# Patient Record
Sex: Female | Born: 1950 | Race: White | Hispanic: No | Marital: Married | Smoking: Never smoker
Health system: Southern US, Community
[De-identification: ages and names within clinical notes are randomized; demographics above are authoritative.]

## PROBLEM LIST (undated history)

## (undated) DIAGNOSIS — K5792 Diverticulitis of intestine, part unspecified, without perforation or abscess without bleeding: Secondary | ICD-10-CM

## (undated) DIAGNOSIS — T7840XA Allergy, unspecified, initial encounter: Secondary | ICD-10-CM

## (undated) DIAGNOSIS — B019 Varicella without complication: Secondary | ICD-10-CM

## (undated) DIAGNOSIS — K635 Polyp of colon: Secondary | ICD-10-CM

## (undated) DIAGNOSIS — E271 Primary adrenocortical insufficiency: Secondary | ICD-10-CM

## (undated) DIAGNOSIS — C801 Malignant (primary) neoplasm, unspecified: Secondary | ICD-10-CM

## (undated) DIAGNOSIS — M1990 Unspecified osteoarthritis, unspecified site: Secondary | ICD-10-CM

## (undated) DIAGNOSIS — K219 Gastro-esophageal reflux disease without esophagitis: Secondary | ICD-10-CM

## (undated) HISTORY — PX: APPENDECTOMY: SHX54

## (undated) HISTORY — PX: BREAST SURGERY: SHX581

## (undated) HISTORY — DX: Polyp of colon: K63.5

## (undated) HISTORY — DX: Primary adrenocortical insufficiency: E27.1

## (undated) HISTORY — PX: ABDOMINAL HYSTERECTOMY: SHX81

## (undated) HISTORY — PX: BREAST EXCISIONAL BIOPSY: SUR124

## (undated) HISTORY — DX: Diverticulitis of intestine, part unspecified, without perforation or abscess without bleeding: K57.92

## (undated) HISTORY — PX: FOOT SURGERY: SHX648

## (undated) HISTORY — PX: TUBAL LIGATION: SHX77

## (undated) HISTORY — DX: Varicella without complication: B01.9

## (undated) HISTORY — DX: Gastro-esophageal reflux disease without esophagitis: K21.9

## (undated) HISTORY — PX: OTHER SURGICAL HISTORY: SHX169

## (undated) HISTORY — DX: Allergy, unspecified, initial encounter: T78.40XA

---

## 1998-11-28 ENCOUNTER — Other Ambulatory Visit: Admission: RE | Admit: 1998-11-28 | Discharge: 1998-11-28 | Payer: Self-pay | Admitting: Obstetrics and Gynecology

## 2005-07-09 ENCOUNTER — Ambulatory Visit: Payer: Self-pay | Admitting: Unknown Physician Specialty

## 2006-08-15 ENCOUNTER — Ambulatory Visit: Payer: Self-pay | Admitting: Unknown Physician Specialty

## 2006-10-13 ENCOUNTER — Ambulatory Visit: Payer: Self-pay | Admitting: Internal Medicine

## 2007-11-10 ENCOUNTER — Ambulatory Visit: Payer: Self-pay | Admitting: Internal Medicine

## 2007-11-28 ENCOUNTER — Inpatient Hospital Stay: Payer: Self-pay | Admitting: Internal Medicine

## 2008-07-08 LAB — HM COLONOSCOPY

## 2008-11-13 ENCOUNTER — Ambulatory Visit: Payer: Self-pay | Admitting: Internal Medicine

## 2008-12-11 ENCOUNTER — Ambulatory Visit: Payer: Self-pay | Admitting: Unknown Physician Specialty

## 2008-12-22 HISTORY — PX: BREAST BIOPSY: SHX20

## 2009-11-20 ENCOUNTER — Ambulatory Visit: Payer: Self-pay | Admitting: Internal Medicine

## 2009-11-22 ENCOUNTER — Ambulatory Visit: Payer: Self-pay | Admitting: Internal Medicine

## 2009-11-28 ENCOUNTER — Ambulatory Visit: Payer: Self-pay | Admitting: Surgery

## 2010-05-02 ENCOUNTER — Ambulatory Visit: Payer: Self-pay | Admitting: Internal Medicine

## 2010-10-10 ENCOUNTER — Ambulatory Visit: Payer: Self-pay | Admitting: Unknown Physician Specialty

## 2010-11-21 ENCOUNTER — Ambulatory Visit: Payer: Self-pay | Admitting: Internal Medicine

## 2011-11-25 ENCOUNTER — Ambulatory Visit: Payer: Self-pay | Admitting: Internal Medicine

## 2012-11-25 ENCOUNTER — Ambulatory Visit: Payer: Self-pay | Admitting: Internal Medicine

## 2013-03-08 LAB — HM PAP SMEAR: HM PAP: NORMAL

## 2013-07-06 ENCOUNTER — Encounter: Payer: Self-pay | Admitting: Internal Medicine

## 2013-07-06 ENCOUNTER — Ambulatory Visit (INDEPENDENT_AMBULATORY_CARE_PROVIDER_SITE_OTHER): Payer: BC Managed Care – PPO | Admitting: Internal Medicine

## 2013-07-06 VITALS — BP 102/66 | HR 91 | Temp 97.8°F | Resp 14 | Ht 64.0 in | Wt 129.8 lb

## 2013-07-06 DIAGNOSIS — K5732 Diverticulitis of large intestine without perforation or abscess without bleeding: Secondary | ICD-10-CM

## 2013-07-06 DIAGNOSIS — G4700 Insomnia, unspecified: Secondary | ICD-10-CM

## 2013-07-06 DIAGNOSIS — F5104 Psychophysiologic insomnia: Secondary | ICD-10-CM

## 2013-07-06 DIAGNOSIS — E2749 Other adrenocortical insufficiency: Secondary | ICD-10-CM

## 2013-07-06 DIAGNOSIS — E271 Primary adrenocortical insufficiency: Secondary | ICD-10-CM

## 2013-07-06 DIAGNOSIS — Z800 Family history of malignant neoplasm of digestive organs: Secondary | ICD-10-CM

## 2013-07-06 DIAGNOSIS — K5792 Diverticulitis of intestine, part unspecified, without perforation or abscess without bleeding: Secondary | ICD-10-CM

## 2013-07-06 NOTE — Progress Notes (Signed)
Patient ID: Veronica Ferrell, female   DOB: July 24, 1951, 62 y.o.   MRN: [id number]  Patient Active Problem List   Diagnosis Date Noted  . Family history of colon cancer requiring screening colonoscopy 07/08/2013  . Chronic insomnia 07/08/2013  . Addison's disease   . Diverticulitis     Subjective:  CC:   Chief Complaint  Patient presents with  . Establish Care    HPI:   Veronica Ferrell is a 62 y.o. female who presents as a new patient to establish primary care with the chief complaint of  Adrenal insufficiency.  Veronica Ferrell was diagnosed with addisons at age 27.  It has been historically managed with cortisone and florinef with no adverse consequences. However in January she developed weight loss and lethargy which occurred in the setting of recent initiation of Veronica Ferrell for onychomycosis. This was prescribed by her dermatologist Veronica Ferrell  Of [facility].  Evaluation at that time noted hyponatremia, hyperkalemia.  Veronica Ferrell referred her to endocrinology after he was unable to improve her symptoms I altering her medications. She has been seeing Dr . Veronica Ferrell regularly for titration of medications and is working hard to restore normal levels  Taking florinef daily now.  She has developed recurrent daily  hot flashes which are tolerable on her current dose of her in.  She denies vaginal dryness and other symptoms and does not want to escalate her dose..   her annual pelvic exams were done by her primary care doctor in the last one was several years ago.   she is status post hysterectomy but is not sure if she has a cervix; records are currently unavailable.   She has annual mammograms  in December,  Dense breasts,  Normal biopsy of right sideby Dr.  Michela Ferrell years ago..    She is due for colonoscopy. Wants to see Veronica Ferrell.  Last one 5 yrs ago,  Diverticulosis noted.  Last diverticulitis episode  6 yrs ago .  FH of colon Ca in mother .   Exercises 5 days per week elliptical and walking and  weights 3 days a week . Osteoporosis  Her bone density has improved  On bisphosphonates but were  stopped secondary to a history of esophageal stricture and concern for risk of esophagitis. She has  .  No history of fractures .  Mother had hip fracture at 42   Chronic insmonia.  Uses valium 1/4 tablet as needed,  Trouble initiating.  didn't tolerate Remus Loffler    Past Medical History  Diagnosis Date  . GERD (gastroesophageal reflux disease)   . Colon polyps   . Diverticulitis   . Addison's disease   . Chicken pox     Past Surgical History  Procedure Laterality Date  . Varicose veins    . Breast surgery    . Appendectomy    . Abdominal hysterectomy      Family History  Problem Relation Age of Onset  . Cancer Mother 76    Breast Ca  colon Ca (58)  and brain Ca (69)  . Cancer Father   . Cancer Maternal Aunt     breast  . Cancer Maternal Grandmother     breast  . Cancer Maternal Aunt     breast ca    History   Social History  . Marital Status: Married    Spouse Name: N/A    Number of Children: N/A  . Years of Education: N/A   Occupational History  .  Not on file.   Social History Main Topics  . Smoking status: Never Smoker   . Smokeless tobacco: Never Used  . Alcohol Use: 4.2 oz/week    7 Glasses of wine per week  . Drug Use: No  . Sexually Active: Yes   Other Topics Concern  . Not on file   Social History Narrative  . No narrative on file    Allergies  Allergen Reactions  . Boniva (Ibandronic Acid) Nausea And Vomiting  . Clindamycin/Lincomycin Dermatitis  . Penicillins Rash    Review of Systems:   Patient denies headache, fevers, malaise, unintentional weight loss, skin rash, eye pain, sinus congestion and sinus pain, sore throat, dysphagia,  hemoptysis , cough, dyspnea, wheezing, chest pain, palpitations, orthopnea, edema, abdominal pain, nausea, melena, diarrhea, constipation, flank pain, dysuria, hematuria, urinary  Frequency, nocturia, numbness,  tingling, seizures,  Focal weakness, Loss of consciousness,  Tremor, insomnia, depression, anxiety, and suicidal ideation.    Objective:  BP 102/66  Pulse 91  Temp(Src) 97.8 F (36.6 C) (Oral)  Resp 14  Ht 5\' 4"  (1.626 m)  Wt 129 lb 12 oz (58.854 kg)  BMI 22.26 kg/m2  SpO2 97%  General appearance: alert, cooperative and appears stated age Ears: normal TM's and external ear canals both ears Throat: lips, mucosa, and tongue normal; teeth and gums normal Neck: no adenopathy, no carotid bruit, supple, symmetrical, trachea midline and thyroid not enlarged, symmetric, no tenderness/mass/nodules Back: symmetric, no curvature. ROM normal. No CVA tenderness. Lungs: clear to auscultation bilaterally Heart: regular rate and rhythm, S1, S2 normal, no murmur, click, rub or gallop Abdomen: soft, non-tender; bowel sounds normal; no masses,  no organomegaly Pulses: 2+ and symmetric Skin: Skin color, texture, turgor normal. No rashes or lesions Lymph nodes: Cervical, supraclavicular, and axillary nodes normal.  Assessment and Plan:  Addison's disease Recent episode of adrenal insufficiency in January was likely precipitated by use of Veronica Ferrell. Veronica Ferrell is managing her dosing of Florinef and cortisone.  Diverticulitis By colonoscopy with most recent episode 6 years ago. Continue  Family history of colon cancer requiring screening colonoscopy Referral to Dr. wall per patient request.  Chronic insomnia Managed with Remeron 30 mg and 2.5 mg Valium. Discuss her use of quartering tablets of valium  which is inconsistent. Will refill with a 5 mg tablet so she went consistent dosing.   Updated Medication List Outpatient Encounter Prescriptions as of 07/06/2013  Medication Sig Dispense Refill  . aspirin 81 MG tablet Take 81 mg by mouth daily.      . calcium citrate (CALCITRATE - DOSED IN MG ELEMENTAL CALCIUM) 950 MG tablet Take 1 tablet by mouth daily.      . Cholecalciferol (VITAMIN D3) 1000  UNITS CAPS Take 1 capsule by mouth daily.      . diazepam (VALIUM) 10 MG tablet Take 10 mg by mouth at bedtime as needed for anxiety.      Marland Kitchen estrogens, conjugated, (PREMARIN) 0.3 MG tablet Take 0.3 mg by mouth daily. Take daily for 21 days then do not take for 7 days.      . fludrocortisone (FLORINEF) 0.1 MG tablet Take 0.1 mg by mouth daily.      . hydrocortisone (CORTEF) 10 MG tablet Take 10 mg by mouth 2 (two) times daily. 10 mg in the morning and 20 mg in the evening      . mirtazapine (REMERON) 30 MG tablet Take 30 mg by mouth at bedtime.      . Multiple  Vitamins-Minerals (MULTIVITAMIN WITH MINERALS) tablet Take 1 tablet by mouth daily.      Marland Kitchen omeprazole (PRILOSEC) 20 MG capsule Take 20 mg by mouth daily.      . polyethylene glycol powder (GLYCOLAX/MIRALAX) powder Take 17 g by mouth daily.      . vitamin C (ASCORBIC ACID) 500 MG tablet Take 500 mg by mouth daily.       No facility-administered encounter medications on file as of 07/06/2013.     Orders Placed This Encounter  Procedures  . HM MAMMOGRAPHY  . Ambulatory referral to Gastroenterology  . HM COLONOSCOPY    No Follow-up on file.

## 2013-07-08 ENCOUNTER — Encounter: Payer: Self-pay | Admitting: Internal Medicine

## 2013-07-08 DIAGNOSIS — E271 Primary adrenocortical insufficiency: Secondary | ICD-10-CM | POA: Insufficient documentation

## 2013-07-08 DIAGNOSIS — Z800 Family history of malignant neoplasm of digestive organs: Secondary | ICD-10-CM | POA: Insufficient documentation

## 2013-07-08 DIAGNOSIS — F5104 Psychophysiologic insomnia: Secondary | ICD-10-CM | POA: Insufficient documentation

## 2013-07-08 DIAGNOSIS — K5730 Diverticulosis of large intestine without perforation or abscess without bleeding: Secondary | ICD-10-CM | POA: Insufficient documentation

## 2013-07-08 NOTE — Assessment & Plan Note (Signed)
Referral to Dr. wall per patient request.

## 2013-07-08 NOTE — Assessment & Plan Note (Signed)
Recent episode of adrenal insufficiency in January was likely precipitated by use of Lamisil. Dr. [name] is managing her dosing of Florinef and cortisone.

## 2013-07-08 NOTE — Assessment & Plan Note (Signed)
Managed with Remeron 30 mg and 2.5 mg Valium. Discuss her use of quartering tablets of valium  which is inconsistent. Will refill with a 5 mg tablet so she went consistent dosing.

## 2013-07-08 NOTE — Assessment & Plan Note (Signed)
By colonoscopy with most recent episode 6 years ago. Continue

## 2013-08-08 ENCOUNTER — Telehealth: Payer: Self-pay | Admitting: Internal Medicine

## 2013-08-08 DIAGNOSIS — E559 Vitamin D deficiency, unspecified: Secondary | ICD-10-CM

## 2013-08-08 DIAGNOSIS — R5381 Other malaise: Secondary | ICD-10-CM

## 2013-08-08 DIAGNOSIS — E785 Hyperlipidemia, unspecified: Secondary | ICD-10-CM

## 2013-08-08 NOTE — Telephone Encounter (Signed)
Pt scheduled CPE for March 2015.  Asking if she can come in a few days prior to have labs.  No orders in at this time.  Please advise.

## 2013-08-08 NOTE — Telephone Encounter (Signed)
Certainly,  i have entered them as of march 2nd. She can make appt

## 2013-08-11 NOTE — Telephone Encounter (Signed)
Patient notified and will callback for lab appointment prior to CPE.

## 2013-09-01 ENCOUNTER — Telehealth: Payer: Self-pay | Admitting: Internal Medicine

## 2013-09-01 DIAGNOSIS — Z1239 Encounter for other screening for malignant neoplasm of breast: Secondary | ICD-10-CM

## 2013-09-01 NOTE — Telephone Encounter (Signed)
Not diagnostic,  3D ordered for dense breasts

## 2013-09-01 NOTE — Telephone Encounter (Signed)
Patient is aware of the process for scheduling her mammogram.

## 2013-09-01 NOTE — Telephone Encounter (Signed)
December 18th mammogram in the morning, last was on 12/08/12.  [place].  Pt would like to get this scheduled as soon as possible.  States Dr. Darrick Huntsman told her at last visit it would be diagnostic due to dense breasts.

## 2013-09-16 ENCOUNTER — Encounter: Payer: Self-pay | Admitting: Internal Medicine

## 2013-10-12 ENCOUNTER — Ambulatory Visit (INDEPENDENT_AMBULATORY_CARE_PROVIDER_SITE_OTHER): Payer: BC Managed Care – PPO | Admitting: Podiatry

## 2013-10-12 ENCOUNTER — Encounter: Payer: Self-pay | Admitting: Podiatry

## 2013-10-12 VITALS — BP 103/64 | HR 84 | Resp 16 | Ht 64.0 in | Wt 120.0 lb

## 2013-10-12 DIAGNOSIS — L600 Ingrowing nail: Secondary | ICD-10-CM

## 2013-10-12 MED ORDER — NEOMYCIN-POLYMYXIN-HC 3.5-10000-1 OT SOLN: OTIC | Status: DC

## 2013-10-12 NOTE — Progress Notes (Signed)
N HURT L B/L GREAT TOENAILS RIGHT IS WORSE D YRS? O SLOWLY C WORSE A GROWING OUT T PT TRIMS TOENAILS, HAS PEDICURES

## 2013-10-12 NOTE — Progress Notes (Signed)
Veronica Ferrell presents today as a 62 year old white female with a chief complaint of painful ingrown toenails to the hallux fibular border bilaterally. She relates that she's recently had her nails done while out down and they were cut them correctly possibly causing this problem. She states down cycler seem to hurt the most particularly on the right foot.  Objective: I have reviewed her past medical history medications allergies review of systems. Vital signs are stable she is alert and oriented x3. Vascular evaluation reveals strong palpable pulses bilateral. Cutaneous evaluation demonstrates supple while hydrated cutis with exception of sharp incurvated nail margins along the fibular border of the hallux bilaterally. She does demonstrates distal onychocryptosis to the hallux right.  Assessment: Ingrown nail hallux bilateral fibular border.  Plan: We discussed etiology pathology conservative versus surgical therapies at this point we decided to perform chemical matrixectomy to the fibular border hallux bilateral. This was performed after 3 cc of a 50-50 mixture of Marcaine plain and lidocaine plain was infiltrated in a digital block about the hallux bilaterally. Each great toe was then prepped and draped in is normal sterile fashion. The toe was exsanguinated and rubber band tourniquet was applied. Fibular borders were split from distal to proximal and avulsed the nail plate. 3 applications of phenol were applied 30 seconds each to the nailbed as well as the matrix. It is neutralized with isopropyl alcohol. A dressing consisting of a Telfa pad and Silvadene cream with Caban was placed on the toes. She was given both oral and written home-going instructions for the care of the toes and soaking instructions. She was also given a prescription for Cortisporin Otic to be applied after soaking. All with her in one week.

## 2013-10-12 NOTE — Patient Instructions (Signed)

## 2013-10-19 ENCOUNTER — Encounter: Payer: Self-pay | Admitting: Podiatry

## 2013-10-19 ENCOUNTER — Ambulatory Visit (INDEPENDENT_AMBULATORY_CARE_PROVIDER_SITE_OTHER): Payer: BC Managed Care – PPO | Admitting: Podiatry

## 2013-10-19 VITALS — BP 118/94 | HR 93 | Resp 16 | Ht 64.0 in | Wt 120.0 lb

## 2013-10-19 DIAGNOSIS — L600 Ingrowing nail: Secondary | ICD-10-CM

## 2013-10-19 NOTE — Progress Notes (Signed)
Veronica Ferrell presents today one week status post matrixectomy hallux bilateral fibular border. She states that they're still a little easy. She continues to soak in Betadine and water. She continues to dress the toes with Cortisporin Otic and a Band-Aid.  Objective: Vital signs are stable she is alert and oriented x3. Left hallux demonstrates no erythema edema cellulitis drainage or odor and appears to be healing perfectly while the right hallux does demonstrate some mild erythema along the fibular border with some serosanguineous drainage. It does not clinically appear to be infected.  Assessment: Well-healing matrixectomy's bilateral hallux. Continue to carefully watch the right hallux.  Plan: Discontinue the use of Betadine and water. She will start with Epsom salts and water on a twice a day basis continue first Cortisporin otic solution and cover during the day and leave open at night. Followup with her on as-needed basis.

## 2013-10-27 ENCOUNTER — Other Ambulatory Visit: Payer: Self-pay

## 2013-11-07 ENCOUNTER — Encounter: Payer: Self-pay | Admitting: Podiatry

## 2013-11-15 ENCOUNTER — Encounter: Payer: Self-pay | Admitting: Internal Medicine

## 2013-11-15 NOTE — Telephone Encounter (Signed)
Pt sent myChart message for Diazepam refill. Your 07/06/13 office note mentions refilling with 5 mg tablet. Pt has been taking 1/4 tab of 10 mg

## 2013-11-16 ENCOUNTER — Other Ambulatory Visit: Payer: Self-pay | Admitting: *Deleted

## 2013-11-16 NOTE — Telephone Encounter (Signed)
Your 07/06/13 office note mentions refilling with 5 mg tablet. Pt has been taking 1/4 tab of 10 mg.

## 2013-11-18 ENCOUNTER — Encounter: Payer: Self-pay | Admitting: *Deleted

## 2013-11-18 MED ORDER — DIAZEPAM 10 MG PO TABS: 10.0000 mg | ORAL_TABLET | Freq: Every evening | ORAL | Status: DC | PRN

## 2013-11-23 ENCOUNTER — Other Ambulatory Visit: Payer: Self-pay | Admitting: *Deleted

## 2013-11-23 MED ORDER — ESTROGENS CONJUGATED 0.3 MG PO TABS: ORAL_TABLET | ORAL | Status: DC

## 2013-11-26 ENCOUNTER — Encounter: Payer: Self-pay | Admitting: Internal Medicine

## 2013-11-28 ENCOUNTER — Ambulatory Visit: Payer: Self-pay | Admitting: Internal Medicine

## 2013-11-28 MED ORDER — ESTROGENS CONJUGATED 0.3 MG PO TABS: 0.3000 mg | ORAL_TABLET | Freq: Every day | ORAL | Status: DC

## 2013-12-08 LAB — HM MAMMOGRAPHY: HM Mammogram: NORMAL

## 2013-12-14 ENCOUNTER — Encounter: Payer: Self-pay | Admitting: Internal Medicine

## 2013-12-19 ENCOUNTER — Encounter: Payer: Self-pay | Admitting: Internal Medicine

## 2013-12-27 ENCOUNTER — Other Ambulatory Visit: Payer: Self-pay | Admitting: *Deleted

## 2013-12-27 NOTE — Telephone Encounter (Signed)
Ok refill? Historical med

## 2013-12-29 MED ORDER — MIRTAZAPINE 30 MG PO TABS: 30.0000 mg | ORAL_TABLET | Freq: Every day | ORAL | Status: DC

## 2013-12-29 NOTE — Telephone Encounter (Signed)
Ok to refill,  Authorized in epic and sent  

## 2014-01-08 LAB — HM COLONOSCOPY

## 2014-01-19 ENCOUNTER — Ambulatory Visit: Payer: Self-pay | Admitting: Gastroenterology

## 2014-01-26 ENCOUNTER — Other Ambulatory Visit: Payer: Self-pay | Admitting: *Deleted

## 2014-01-26 MED ORDER — OMEPRAZOLE 20 MG PO CPDR: 20.0000 mg | DELAYED_RELEASE_CAPSULE | Freq: Every day | ORAL | Status: DC

## 2014-02-22 ENCOUNTER — Other Ambulatory Visit: Payer: Self-pay | Admitting: *Deleted

## 2014-02-22 NOTE — Telephone Encounter (Signed)
Upcoming appt 03/08/14, ok refill?

## 2014-02-24 ENCOUNTER — Other Ambulatory Visit: Payer: Self-pay | Admitting: *Deleted

## 2014-02-24 ENCOUNTER — Encounter: Payer: Self-pay | Admitting: Internal Medicine

## 2014-02-24 MED ORDER — FLUDROCORTISONE ACETATE 0.1 MG PO TABS: 0.1000 mg | ORAL_TABLET | Freq: Every day | ORAL | Status: DC

## 2014-02-24 NOTE — Telephone Encounter (Signed)
Ok to refill,  Refill sent  

## 2014-03-02 ENCOUNTER — Other Ambulatory Visit (INDEPENDENT_AMBULATORY_CARE_PROVIDER_SITE_OTHER): Payer: BC Managed Care – PPO

## 2014-03-02 DIAGNOSIS — R5383 Other fatigue: Principal | ICD-10-CM

## 2014-03-02 DIAGNOSIS — R5381 Other malaise: Secondary | ICD-10-CM

## 2014-03-02 DIAGNOSIS — E875 Hyperkalemia: Secondary | ICD-10-CM

## 2014-03-02 DIAGNOSIS — E559 Vitamin D deficiency, unspecified: Secondary | ICD-10-CM

## 2014-03-02 DIAGNOSIS — E785 Hyperlipidemia, unspecified: Secondary | ICD-10-CM

## 2014-03-02 LAB — LIPID PANEL
Cholesterol: 196 mg/dL (ref 0–200)
HDL: 99.6 mg/dL (ref >39.00)
LDL CALC: 82 mg/dL (ref 0–99)
Total CHOL/HDL Ratio: 2
Triglycerides: 74 mg/dL (ref 0.0–149.0)
VLDL: 14.8 mg/dL (ref 0.0–40.0)

## 2014-03-02 LAB — CBC WITH DIFFERENTIAL/PLATELET
Basophils Absolute: 0.1 10*3/uL (ref 0.0–0.1)
Basophils Relative: 0.7 % (ref 0.0–3.0)
EOS ABS: 0.1 10*3/uL (ref 0.0–0.7)
EOS PCT: 2 % (ref 0.0–5.0)
HCT: 40.3 % (ref 36.0–46.0)
Hemoglobin: 13.3 g/dL (ref 12.0–15.0)
LYMPHS PCT: 55 % — AB (ref 12.0–46.0)
Lymphs Abs: 4.1 10*3/uL — ABNORMAL HIGH (ref 0.7–4.0)
MCHC: 33 g/dL (ref 30.0–36.0)
MCV: 98.1 fl (ref 78.0–100.0)
MONO ABS: 0.6 10*3/uL (ref 0.1–1.0)
Monocytes Relative: 8.2 % (ref 3.0–12.0)
NEUTROS PCT: 34.1 % — AB (ref 43.0–77.0)
Neutro Abs: 2.6 10*3/uL (ref 1.4–7.7)
Platelets: 308 10*3/uL (ref 150.0–400.0)
RBC: 4.11 Mil/uL (ref 3.87–5.11)
RDW: 13.7 % (ref 11.5–14.6)
WBC: 7.5 10*3/uL (ref 4.5–10.5)

## 2014-03-02 LAB — COMPREHENSIVE METABOLIC PANEL
ALBUMIN: 4.1 g/dL (ref 3.5–5.2)
ALT: 15 U/L (ref 0–35)
AST: 20 U/L (ref 0–37)
Alkaline Phosphatase: 25 U/L — ABNORMAL LOW (ref 39–117)
BUN: 21 mg/dL (ref 6–23)
CO2: 28 mEq/L (ref 19–32)
Calcium: 9.6 mg/dL (ref 8.4–10.5)
Chloride: 104 mEq/L (ref 96–112)
Creatinine, Ser: 0.9 mg/dL (ref 0.4–1.2)
GFR: 64.33 mL/min (ref >60.00)
GLUCOSE: 91 mg/dL (ref 70–99)
POTASSIUM: 5.2 meq/L — AB (ref 3.5–5.1)
SODIUM: 139 meq/L (ref 135–145)
TOTAL PROTEIN: 6.4 g/dL (ref 6.0–8.3)
Total Bilirubin: 0.8 mg/dL (ref 0.3–1.2)

## 2014-03-02 LAB — TSH: TSH: 3.71 u[IU]/mL (ref 0.35–5.50)

## 2014-03-03 ENCOUNTER — Encounter: Payer: Self-pay | Admitting: Internal Medicine

## 2014-03-03 LAB — VITAMIN D 25 HYDROXY (VIT D DEFICIENCY, FRACTURES): VIT D 25 HYDROXY: 69 ng/mL (ref 30–89)

## 2014-03-03 NOTE — Addendum Note (Signed)
Addended by: Crecencio Mc on: 03/03/2014 09:20 AM   Modules accepted: Orders

## 2014-03-07 ENCOUNTER — Other Ambulatory Visit: Payer: BC Managed Care – PPO

## 2014-03-07 ENCOUNTER — Other Ambulatory Visit (INDEPENDENT_AMBULATORY_CARE_PROVIDER_SITE_OTHER): Payer: BC Managed Care – PPO

## 2014-03-07 DIAGNOSIS — E875 Hyperkalemia: Secondary | ICD-10-CM

## 2014-03-07 LAB — BASIC METABOLIC PANEL
BUN: 15 mg/dL (ref 6–23)
CALCIUM: 9.3 mg/dL (ref 8.4–10.5)
CO2: 26 mEq/L (ref 19–32)
Chloride: 99 mEq/L (ref 96–112)
Creatinine, Ser: 0.9 mg/dL (ref 0.4–1.2)
GFR: 66.81 mL/min (ref >60.00)
Glucose, Bld: 96 mg/dL (ref 70–99)
Potassium: 4.7 mEq/L (ref 3.5–5.1)
SODIUM: 135 meq/L (ref 135–145)

## 2014-03-08 ENCOUNTER — Encounter: Payer: Self-pay | Admitting: Internal Medicine

## 2014-03-08 ENCOUNTER — Ambulatory Visit (INDEPENDENT_AMBULATORY_CARE_PROVIDER_SITE_OTHER): Payer: BC Managed Care – PPO | Admitting: Internal Medicine

## 2014-03-08 VITALS — BP 102/60 | HR 83 | Temp 97.5°F | Resp 16 | Ht 64.5 in | Wt 126.0 lb

## 2014-03-08 DIAGNOSIS — G454 Transient global amnesia: Secondary | ICD-10-CM

## 2014-03-08 DIAGNOSIS — G459 Transient cerebral ischemic attack, unspecified: Secondary | ICD-10-CM

## 2014-03-08 DIAGNOSIS — Z0000 Encounter for general adult medical examination without abnormal findings: Secondary | ICD-10-CM

## 2014-03-08 MED ORDER — FLUDROCORTISONE ACETATE 0.1 MG PO TABS: 0.1000 mg | ORAL_TABLET | Freq: Every day | ORAL | Status: DC

## 2014-03-08 MED ORDER — HYDROCOD POLST-CHLORPHEN POLST 10-8 MG/5ML PO LQCR: 10.0000 mL | Freq: Every evening | ORAL | Status: DC | PRN

## 2014-03-08 MED ORDER — OMEPRAZOLE 20 MG PO CPDR: 20.0000 mg | DELAYED_RELEASE_CAPSULE | Freq: Every day | ORAL | Status: DC

## 2014-03-08 MED ORDER — MIRTAZAPINE 30 MG PO TABS: 30.0000 mg | ORAL_TABLET | Freq: Every day | ORAL | Status: DC

## 2014-03-08 MED ORDER — HYDROCORTISONE 10 MG PO TABS: 10.0000 mg | ORAL_TABLET | Freq: Two times a day (BID) | ORAL | Status: DC

## 2014-03-08 MED ORDER — ESTROGENS CONJUGATED 0.3 MG PO TABS: 0.3000 mg | ORAL_TABLET | Freq: Every day | ORAL | Status: DC

## 2014-03-08 NOTE — Patient Instructions (Signed)
You had your annual  wellness exam today, along  with your hospital follow up   We will schedule your mammogram as a 3D in December   We will contact you with the cardiology appt.  Follow up in 6 months ,  Sooner depending on the cardiology outcome

## 2014-03-08 NOTE — Assessment & Plan Note (Addendum)
I suspect this may have been a TIA.  MRI was normal.  I am recommending  Cardiology evaluation specifically for an ECHO with bubble study to rule out PFO. Advised her to refrain from sexual intercourse and any physical exertion until she has been evaluated by cardiology.  Referral to [facility] (or whatever they call themselves now) to [place] or [place].

## 2014-03-08 NOTE — Progress Notes (Signed)
Pre-visit discussion using our clinic review tool. No additional management support is needed unless otherwise documented below in the visit note.  

## 2014-03-08 NOTE — Progress Notes (Signed)
Patient ID: Veronica Ferrell, female   DOB: 1951/08/07, 63 y.o.   MRN: [id number]   Subjective:     Veronica Ferrell is a 63 y.o. female and is here for a comprehensive annual physical exam. The patient reports recent episode of transient amnesia.   She did not schedule a hospital follow up to discuss recent overnight admission to out of state hospital for TIA symptoms but decided to wait until her physical.   History: patient as history of Addison's disease;  had sudden onset of amnesia for recent events, which occurred after having intercourse with husband while staying in [place].  Episode Occurred on Feb 23 after flying to [facility] on Feb 22.  Remembers having considerable jet lag symptoms aggravated by chronic adrenal insufficiency.  Had not missed any of he usual medications.   Suddenly could not remember where she was, why she was in [place].  She Could remember the names of her medications, as well as her own name and that of husband, but could not  Recall any events that occurred with in the prior 2-3 hour window of time . Admitted overnight obs to a hospital in [facility] and diagnosed with transient global amnesia by local neurology after MRI of brain, cervical spine, EEG EKG and CXR were done.  .  Told her brain was younger looking that expected but that the amnesia may have been caused by a vascular event.  Not sure if ECHO was done .  Results not currently available for review.    Adrenal insufficiency.  [name] was diagnosed with addisons at age 21.  It has been historically managed with cortisone and florinef with no adverse consequences. However in January 2014 she developed weight loss and lethargy which occurred in the setting of recent initiation of Lamisil for onychomycosis. This was prescribed by her dermatologist [facility].  Evaluation at that time noted hyponatremia, hyperkalemia.  Dr. Arline Asp referred her to endocrinology after he was unable to improve her symptoms I  altering her medications. She has been seeing Dr . Gabriel Carina regularly for titration of medications and is working hard to restore normal levels  Taking florinef daily now.  She has developed recurrent daily  hot flashes which are tolerable on her current dose of her in.  She denies vaginal dryness and other symptoms and does not want to escalate her dose..   her annual pelvic exams have been  done by her primary care doctor in the last one was in 2012, she thinks.   she is status post hysterectomy but is not sure if she has a cervix; records are currently unavailable.   She has annual mammograms  in December,  Dense breasts,  Normal biopsy of right side by Dr.  Pat Patrick years ago..    She had her  5 yr follow up colonoscopy by Lucilla Lame.   Diverticulosis noted.  Last diverticulitis episode  6 yrs ago .  There is a FH of colon Ca in mother .   Exercises 5 days per week elliptical and walking and weights 3 days a week . Osteoporosis  Her bone density has improved  On bisphosphonates but were  stopped secondary to a history of esophageal stricture and concern for risk of esophagitis. She has  .  No history of fractures .  Mother had hip fracture at 46   Chronic insmonia.  Uses valium 1/4 tablet as needed,  Trouble initiating.  didn't tolerate Lorrin Mais  History   Social History  . Marital Status: Married    Spouse Name: N/A    Number of Children: N/A  . Years of Education: N/A   Occupational History  . Not on file.   Social History Main Topics  . Smoking status: Never Smoker   . Smokeless tobacco: Never Used  . Alcohol Use: 4.2 oz/week    7 Glasses of wine per week     Comment: [place]  . Drug Use: No  . Sexual Activity: Yes   Other Topics Concern  . Not on file   Social History Narrative  . No narrative on file   Health Maintenance  Topic Date Due  . Influenza Vaccine  07/22/2014  . Mammogram  12/09/2015  . Pap Smear  03/08/2016  . Tetanus/tdap  03/08/2021  . Colonoscopy   01/09/2024  . Zostavax  Completed    The following portions of the patient's history were reviewed and updated as appropriate: allergies, current medications, past family history, past medical history, past social history, past surgical history and problem list.  Review of Systems A comprehensive review of systems was negative.   Objective:  BP 102/60  Pulse 83  Temp(Src) 97.5 F (36.4 C) (Oral)  Resp 16  Ht 5' 4.5" (1.638 m)  Wt 126 lb (57.153 kg)  BMI 21.30 kg/m2  SpO2 97%  General Appearance:    Alert, cooperative, no distress, appears stated age  Head:    Normocephalic, without obvious abnormality, atraumatic  Eyes:    PERRL, conjunctiva/corneas clear, EOM's intact, fundi    benign, both eyes  Ears:    Normal TM's and external ear canals, both ears  Nose:   Nares normal, septum midline, mucosa normal, no drainage    or sinus tenderness  Throat:   Lips, mucosa, and tongue normal; teeth and gums normal  Neck:   Supple, symmetrical, trachea midline, no adenopathy;    thyroid:  no enlargement/tenderness/nodules; no carotid   bruit or JVD  Back:     Symmetric, no curvature, ROM normal, no CVA tenderness  Lungs:     Clear to auscultation bilaterally, respirations unlabored  Chest Wall:    No tenderness or deformity   Heart:    Regular rate and rhythm, S1 and S2 normal, no murmur, rub   or gallop  Breast Exam:    No tenderness, masses, or nipple abnormality  Abdomen:     Soft, non-tender, bowel sounds active all four quadrants,    no masses, no organomegaly  Genitalia:    Deferred  Rectal:    Deferred   Extremities:   Extremities normal, atraumatic, no cyanosis or edema  Pulses:   2+ and symmetric all extremities  Skin:   Skin color, texture, turgor normal, no rashes or lesions  Lymph nodes:   Cervical, supraclavicular, and axillary nodes normal  Neurologic:   CNII-XII intact, normal strength, sensation and reflexes    throughout    Assessment and Plan:   Transient  global amnesia I suspect this may have been a TIA.  MRI was normal.  I am recommending  Cardiology evaluation specifically for an ECHO with bubble study to rule out PFO. Advised her to refrain from sexual intercourse and any physical exertion until she has been evaluated by cardiology.  Referral to [facility] (or whatever they call themselves now) to [place] or [place].    Encounter for preventive health examination Annual comprehensive exam was done including breast, excluding pelvic and PAP smear.  All screenings have been addressed .    Updated Medication List Outpatient Encounter Prescriptions as of 03/08/2014  Medication Sig  . aspirin 81 MG tablet Take 81 mg by mouth daily.  . calcium citrate (CALCITRATE - DOSED IN MG ELEMENTAL CALCIUM) 950 MG tablet Take 1 tablet by mouth daily.  . Cholecalciferol (VITAMIN D3) 1000 UNITS CAPS Take 1 capsule by mouth daily.  . diazepam (VALIUM) 10 MG tablet Take 1 tablet (10 mg total) by mouth at bedtime as needed for anxiety.  Marland Kitchen estrogens, conjugated, (PREMARIN) 0.3 MG tablet Take 1 tablet (0.3 mg total) by mouth daily.  . fludrocortisone (FLORINEF) 0.1 MG tablet Take 1 tablet (0.1 mg total) by mouth daily.  . hydrocortisone (CORTEF) 10 MG tablet Take 1 tablet (10 mg total) by mouth 2 (two) times daily. 20 mg in the morning and 10 mg in the evening  . mirtazapine (REMERON) 30 MG tablet Take 1 tablet (30 mg total) by mouth at bedtime.  . Multiple Vitamins-Minerals (MULTIVITAMIN WITH MINERALS) tablet Take 1 tablet by mouth daily.  Marland Kitchen omeprazole (PRILOSEC) 20 MG capsule Take 1 capsule (20 mg total) by mouth daily.  . polyethylene glycol powder (GLYCOLAX/MIRALAX) powder Take 17 g by mouth daily.  . vitamin C (ASCORBIC ACID) 500 MG tablet Take 500 mg by mouth daily.  . [DISCONTINUED] estrogens, conjugated, (PREMARIN) 0.3 MG tablet Take 1 tablet (0.3 mg total) by mouth daily.  . [DISCONTINUED] fludrocortisone (FLORINEF) 0.1 MG tablet Take 1 tablet (0.1 mg  total) by mouth daily.  . [DISCONTINUED] hydrocortisone (CORTEF) 10 MG tablet Take 10 mg by mouth 2 (two) times daily. 20 mg in the morning and 10 mg in the evening  . [DISCONTINUED] mirtazapine (REMERON) 30 MG tablet Take 1 tablet (30 mg total) by mouth at bedtime.  . [DISCONTINUED] omeprazole (PRILOSEC) 20 MG capsule Take 1 capsule (20 mg total) by mouth daily.  . chlorpheniramine-HYDROcodone (TUSSIONEX) 10-8 MG/5ML LQCR Take 10 mLs by mouth at bedtime as needed for cough.  . [DISCONTINUED] neomycin-polymyxin-hydrocortisone (CORTISPORIN) otic solution Apply one to two drops to toe after soaking twice daily.

## 2014-03-09 DIAGNOSIS — Z0000 Encounter for general adult medical examination without abnormal findings: Secondary | ICD-10-CM | POA: Insufficient documentation

## 2014-03-09 NOTE — Assessment & Plan Note (Signed)
Annual comprehensive exam was done including breast, excluding pelvic and PAP smear. All screenings have been addressed .  

## 2014-03-16 ENCOUNTER — Encounter: Payer: Self-pay | Admitting: Cardiovascular Disease

## 2014-03-16 ENCOUNTER — Ambulatory Visit (INDEPENDENT_AMBULATORY_CARE_PROVIDER_SITE_OTHER): Payer: BC Managed Care – PPO | Admitting: Cardiovascular Disease

## 2014-03-16 VITALS — BP 90/60 | HR 90 | Ht 64.0 in | Wt 125.5 lb

## 2014-03-16 DIAGNOSIS — E2749 Other adrenocortical insufficiency: Secondary | ICD-10-CM

## 2014-03-16 DIAGNOSIS — G454 Transient global amnesia: Secondary | ICD-10-CM

## 2014-03-16 DIAGNOSIS — E271 Primary adrenocortical insufficiency: Secondary | ICD-10-CM

## 2014-03-16 NOTE — Patient Instructions (Addendum)
We have ordered a echocardiogram with saline contrast bubble study to rule out PFO:  Tuesday, March 31 @ noon We will call you with the results  Please monitor your heart rhythm.  No medication changes were made.Consider aspirin 81 mg x 2  Please call us if you have new issues that need to be addressed before your next appt.

## 2014-03-16 NOTE — Assessment & Plan Note (Addendum)
Records were reviewed from her recent hospital course, including H&P, consultation report, MRI, CT scan, EEG . All studies appeared negative. Given her presentation, there is concern for TIA. Echocardiogram with saline contrast bubble study has been ordered to exclude PFO or ASD. We have talked about adding Plavix to her medical regimen. She prefers to stay on aspirin alone at this time until we have the results of her echocardiogram. We also discussed a Holter monitor. This might help exclude atrial fibrillation or other arrhythmia as a cause of her symptoms and TIA. She would prefer to wait at this time.  We spent significant time discussing the structure of the heart, etiology of possible TIA including PFO and ASD, even arrhythmia.

## 2014-03-16 NOTE — Assessment & Plan Note (Signed)
Managed by Dr. Derrel Nip and Dr. Gabriel Carina.

## 2014-03-16 NOTE — Progress Notes (Signed)
Patient ID: Veronica Ferrell, female    DOB: [birth year], 63 y.o.   MRN: [id number]  HPI Comments:  63 y.o. female with history of Addison's disease, maintained on cortisone and Florinef (previous hyponatremia and hyperkalemia), managed by Dr. Derrel Nip and [place], chronic insomnia, who presents by referral after episode of transient amnesia.  She reports that on 02/12/2014  she flew into [place]. She had 2 glasses of wine, that evening took one quarter pill Valium. In the morning she woke with headache at 6 AM, had coffee. That morning also had sexual relations with her husband. Shortly after had a shower. Coming out of the shower she had acute onset of amnesia. She denied any balance issues, vision issues, no neurologic problems. Her amnesia persisted for many hours. She repeated the same questions over and over. She had taken her regular morning pills but asked her husband 28 times if she had taken her morning pills.   She presented to the emergency room where she had CT scan of the head, MRI, EEG. Testing was essentially normal. No carotid stenoses noted. She was placed on telemetry per the patient with notes indicating no arrhythmia.  She was released. Symptoms seemed to resolve 4-5 hours after initial presentation She does report having a problem with jet lag in the past but typically has upset stomach. Has never had amnesia before Total cholesterol 194, HDL 100, LDL 81  EKG shows normal sinus rhythm with rate 90 beats per minute, no significant ST or T wave changes No echocardiogram done on her hospital admission. No cold or ordered      Outpatient Encounter Prescriptions as of 03/16/2014  Medication Sig  . aspirin 81 MG tablet Take 81 mg by mouth daily.  . calcium citrate (CALCITRATE - DOSED IN MG ELEMENTAL CALCIUM) 950 MG tablet Take 1 tablet by mouth daily.  . chlorpheniramine-HYDROcodone (TUSSIONEX) 10-8 MG/5ML LQCR Take 10 mLs by mouth at bedtime as needed for cough.  .  Cholecalciferol (VITAMIN D3) 1000 UNITS CAPS Take 1 capsule by mouth daily.  . diazepam (VALIUM) 10 MG tablet Take 1 tablet (10 mg total) by mouth at bedtime as needed for anxiety.  Marland Kitchen estrogens, conjugated, (PREMARIN) 0.3 MG tablet Take 1 tablet (0.3 mg total) by mouth daily.  . fludrocortisone (FLORINEF) 0.1 MG tablet Take 1 tablet (0.1 mg total) by mouth daily.  . hydrocortisone (CORTEF) 10 MG tablet Take 1 tablet (10 mg total) by mouth 2 (two) times daily. 20 mg in the morning and 10 mg in the evening  . mirtazapine (REMERON) 30 MG tablet Take 1 tablet (30 mg total) by mouth at bedtime.  . Multiple Vitamins-Minerals (MULTIVITAMIN WITH MINERALS) tablet Take 1 tablet by mouth daily.  Marland Kitchen omeprazole (PRILOSEC) 20 MG capsule Take 1 capsule (20 mg total) by mouth daily.  . polyethylene glycol powder (GLYCOLAX/MIRALAX) powder Take 17 g by mouth daily.  . vitamin C (ASCORBIC ACID) 500 MG tablet Take 500 mg by mouth daily.     Review of Systems  Constitutional: Negative.   HENT: Negative.   Eyes: Negative.   Respiratory: Negative.   Cardiovascular: Negative.   Gastrointestinal: Negative.   Endocrine: Negative.   Musculoskeletal: Negative.   Skin: Negative.   Allergic/Immunologic: Negative.   Neurological: Negative.        Transient amnesia  Hematological: Negative.   Psychiatric/Behavioral: Negative.   All other systems reviewed and are negative.    BP 90/60  Pulse 90  Ht 5\' 4"  (1.626 m)  Wt 125 lb 8 oz (56.926 kg)  BMI 21.53 kg/m2  Physical Exam  Nursing note and vitals reviewed. Constitutional: She is oriented to person, place, and time. She appears well-developed and well-nourished.  HENT:  Head: Normocephalic.  Nose: Nose normal.  Mouth/Throat: Oropharynx is clear and moist.  Eyes: Conjunctivae are normal. Pupils are equal, round, and reactive to light.  Neck: Normal range of motion. Neck supple. No JVD present.  Cardiovascular: Normal rate, regular rhythm, S1 normal, S2  normal, normal heart sounds and intact distal pulses.  Exam reveals no gallop and no friction rub.   No murmur heard. Pulmonary/Chest: Effort normal and breath sounds normal. No respiratory distress. She has no wheezes. She has no rales. She exhibits no tenderness.  Abdominal: Soft. Bowel sounds are normal. She exhibits no distension. There is no tenderness.  Musculoskeletal: Normal range of motion. She exhibits no edema and no tenderness.  Lymphadenopathy:    She has no cervical adenopathy.  Neurological: She is alert and oriented to person, place, and time. Coordination normal.  Skin: Skin is warm and dry. No rash noted. No erythema.  Psychiatric: She has a normal mood and affect. Her behavior is normal. Judgment and thought content normal.    Assessment and Plan

## 2014-03-21 ENCOUNTER — Encounter: Payer: Self-pay | Admitting: Internal Medicine

## 2014-03-21 ENCOUNTER — Other Ambulatory Visit (INDEPENDENT_AMBULATORY_CARE_PROVIDER_SITE_OTHER): Payer: BC Managed Care – PPO

## 2014-03-21 ENCOUNTER — Other Ambulatory Visit: Payer: Self-pay

## 2014-03-21 DIAGNOSIS — G459 Transient cerebral ischemic attack, unspecified: Secondary | ICD-10-CM

## 2014-03-21 DIAGNOSIS — I059 Rheumatic mitral valve disease, unspecified: Secondary | ICD-10-CM

## 2014-03-21 DIAGNOSIS — G454 Transient global amnesia: Secondary | ICD-10-CM

## 2014-05-10 ENCOUNTER — Encounter: Payer: Self-pay | Admitting: Internal Medicine

## 2014-05-16 ENCOUNTER — Telehealth: Payer: Self-pay | Admitting: Internal Medicine

## 2014-05-16 NOTE — Telephone Encounter (Signed)
Patient dropped off paper work that needs to be reviewed so she can continue to get rx for Premain tab 0.3mg . Patient stated that she dropped off the same paper work two weeks ago and sent messages on mychart. Patient is upset that this has not done. Form in Dr Lupita Dawn box. Please call patient/msn

## 2014-05-16 NOTE — Telephone Encounter (Signed)
Paperwork given to Dr. Derrel Nip for review

## 2014-05-18 NOTE — Telephone Encounter (Signed)
See other encounter, Juliann Pulse has already spoken with pt, PA has been approved, pt aware.

## 2014-05-18 NOTE — Telephone Encounter (Signed)
[place] and received  Approval for patient. Patient notified after she had called patient stated that insurance had not received PA called insurance and they had received the PA and approval was granted.

## 2014-06-01 ENCOUNTER — Encounter: Payer: Self-pay | Admitting: Internal Medicine

## 2014-06-01 ENCOUNTER — Ambulatory Visit (INDEPENDENT_AMBULATORY_CARE_PROVIDER_SITE_OTHER): Payer: Medicare Other | Admitting: Internal Medicine

## 2014-06-01 VITALS — BP 100/64 | HR 87 | Temp 98.1°F | Wt 124.0 lb

## 2014-06-01 DIAGNOSIS — K5732 Diverticulitis of large intestine without perforation or abscess without bleeding: Secondary | ICD-10-CM

## 2014-06-01 MED ORDER — CIPROFLOXACIN HCL 500 MG PO TABS: 500.0000 mg | ORAL_TABLET | Freq: Two times a day (BID) | ORAL | Status: DC

## 2014-06-01 MED ORDER — METRONIDAZOLE 500 MG PO TABS: 500.0000 mg | ORAL_TABLET | Freq: Three times a day (TID) | ORAL | Status: DC

## 2014-06-01 NOTE — Patient Instructions (Addendum)
Diverticulitis °A diverticulum is a small pouch or sac on the colon. Diverticulosis is the presence of these diverticula on the colon. Diverticulitis is the irritation (inflammation) or infection of diverticula. °CAUSES  °The colon and its diverticula contain bacteria. If food particles block the tiny opening to a diverticulum, the bacteria inside can grow and cause an increase in pressure. This leads to infection and inflammation and is called diverticulitis. °SYMPTOMS  °· Abdominal pain and tenderness. Usually, the pain is located on the left side of your abdomen. However, it could be located elsewhere. °· Fever. °· Bloating. °· Feeling sick to your stomach (nausea). °· Throwing up (vomiting). °· Abnormal stools. °DIAGNOSIS  °Your caregiver will take a history and perform a physical exam. Since many things can cause abdominal pain, other tests may be necessary. Tests may include: °· Blood tests. °· Urine tests. °· X-ray of the abdomen. °· CT scan of the abdomen. °Sometimes, surgery is needed to determine if diverticulitis or other conditions are causing your symptoms. °TREATMENT  °Most of the time, you can be treated without surgery. Treatment includes: °· Resting the bowels by only having liquids for a few days. As you improve, you will need to eat a low-fiber diet. °· Intravenous (IV) fluids if you are losing body fluids (dehydrated). °· Antibiotic medicines that treat infections may be given. °· Pain and nausea medicine, if needed. °· Surgery if the inflamed diverticulum has burst. °HOME CARE INSTRUCTIONS  °· Try a clear liquid diet (broth, tea, or water for as long as directed by your caregiver). You may then gradually begin a low-fiber diet as tolerated.  °A low-fiber diet is a diet with less than 10 grams of fiber. Choose the foods below to reduce fiber in the diet: °· White breads, cereals, rice, and pasta. °· Cooked fruits and vegetables or soft fresh fruits and vegetables without the skin. °· Ground or  well-cooked tender beef, ham, veal, lamb, pork, or poultry. °· Eggs and seafood. °· After your diverticulitis symptoms have improved, your caregiver may put you on a high-fiber diet. A high-fiber diet includes 14 grams of fiber for every 1000 calories consumed. For a standard 2000 calorie diet, you would need 28 grams of fiber. Follow these diet guidelines to help you increase the fiber in your diet. It is important to slowly increase the amount fiber in your diet to avoid gas, constipation, and bloating. °· Choose whole-grain breads, cereals, pasta, and brown rice. °· Choose fresh fruits and vegetables with the skin on. Do not overcook vegetables because the more vegetables are cooked, the more fiber is lost. °· Choose more nuts, seeds, legumes, dried peas, beans, and lentils. °· Look for food products that have greater than 3 grams of fiber per serving on the Nutrition Facts label. °· Take all medicine as directed by your caregiver. °· If your caregiver has given you a follow-up appointment, it is very important that you go. Not going could result in lasting (chronic) or permanent injury, pain, and disability. If there is any problem keeping the appointment, call to reschedule. °SEEK MEDICAL CARE IF:  °· Your pain does not improve. °· You have a hard time advancing your diet beyond clear liquids. °· Your bowel movements do not return to normal. °SEEK IMMEDIATE MEDICAL CARE IF:  °· Your pain becomes worse. °· You have an oral temperature above 102° F (38.9° C), not controlled by medicine. °· You have repeated vomiting. °· You have bloody or black, tarry stools. °·   Symptoms that brought you to your caregiver become worse or are not getting better. °MAKE SURE YOU:  °· Understand these instructions. °· Will watch your condition. °· Will get help right away if you are not doing well or get worse. °Document Released: 09/17/2005 Document Revised: 03/01/2012 Document Reviewed: 01/13/2011 °[employer]® Patient Information  ©2014 [employer]. ° °

## 2014-06-01 NOTE — Progress Notes (Signed)
Pre visit review using our clinic review tool, if applicable. No additional management support is needed unless otherwise documented below in the visit note. 

## 2014-06-01 NOTE — Progress Notes (Signed)
Subjective:    Patient ID: Veronica Ferrell, female    DOB: 1951-02-25, 63 y.o.   MRN: [id number]  HPI  Pt presents to the clinic today with c/o lower abdominal pain. She reports this started 4 days ago. She describes the pain as crampy and achy. She has felt a little constipated. She did use Miralax and an enema this morning. She reports that she feels bloated. Her stools are normal in color but she does feel like they have "mucuos: in them as well. She denies blood in her stool. She has had some associated fatigue and nausea, but denies vomiting, fever or chills. She does have a history of diverticulosis. Last colonoscopy was 01/18/14.  Review of Systems      Past Medical History  Diagnosis Date  . GERD (gastroesophageal reflux disease)   . Colon polyps   . Diverticulitis   . Addison's disease   . Chicken pox     Current Outpatient Prescriptions  Medication Sig Dispense Refill  . aspirin 81 MG tablet Take 81 mg by mouth daily.      . calcium citrate (CALCITRATE - DOSED IN MG ELEMENTAL CALCIUM) 950 MG tablet Take 1 tablet by mouth daily.      . chlorpheniramine-HYDROcodone (TUSSIONEX) 10-8 MG/5ML LQCR Take 10 mLs by mouth at bedtime as needed for cough.  240 mL  0  . Cholecalciferol (VITAMIN D3) 1000 UNITS CAPS Take 1 capsule by mouth daily.      . diazepam (VALIUM) 10 MG tablet Take 1 tablet (10 mg total) by mouth at bedtime as needed for anxiety.  30 tablet  3  . estrogens, conjugated, (PREMARIN) 0.3 MG tablet Take 1 tablet (0.3 mg total) by mouth daily.  90 tablet  3  . fludrocortisone (FLORINEF) 0.1 MG tablet Take 1 tablet (0.1 mg total) by mouth daily.  90 tablet  3  . hydrocortisone (CORTEF) 10 MG tablet Take 1 tablet (10 mg total) by mouth 2 (two) times daily. 20 mg in the morning and 10 mg in the evening  270 tablet  3  . mirtazapine (REMERON) 30 MG tablet Take 1 tablet (30 mg total) by mouth at bedtime.  90 tablet  3  . Multiple Vitamins-Minerals (MULTIVITAMIN WITH MINERALS)  tablet Take 1 tablet by mouth daily.      Marland Kitchen omeprazole (PRILOSEC) 20 MG capsule Take 1 capsule (20 mg total) by mouth daily.  90 capsule  3  . polyethylene glycol powder (GLYCOLAX/MIRALAX) powder Take 17 g by mouth daily.      . vitamin C (ASCORBIC ACID) 500 MG tablet Take 500 mg by mouth daily.       No current facility-administered medications for this visit.    Allergies  Allergen Reactions  . Boniva [Ibandronic Acid] Nausea And Vomiting  . Clindamycin/Lincomycin Dermatitis  . Penicillins Rash    Family History  Problem Relation Age of Onset  . Cancer Mother 48    Breast Ca  colon Ca (18)  and brain Ca (54)  . Cancer Father   . Cancer Maternal Aunt     breast  . Cancer Maternal Grandmother     breast  . Cancer Maternal Aunt     breast ca    History   Social History  . Marital Status: Married    Spouse Name: N/A    Number of Children: N/A  . Years of Education: N/A   Occupational History  . Not on file.   Social History Main  Topics  . Smoking status: Never Smoker   . Smokeless tobacco: Never Used  . Alcohol Use: 4.2 oz/week    7 Glasses of wine per week     Comment: [place]  . Drug Use: No  . Sexual Activity: Yes   Other Topics Concern  . Not on file   Social History Narrative  . No narrative on file     Constitutional: Denies fever, malaise, fatigue, headache or abrupt weight changes.  Gastrointestinal: Pt reports abdominal pain. Denies constipation, diarrhea or blood in the stool.  GU: Denies urgency, frequency, pain with urination, burning sensation, blood in urine, odor or discharge.    No other specific complaints in a complete review of systems (except as listed in HPI above).  Objective:   Physical Exam    BP 100/64  Pulse 87  Temp(Src) 98.1 F (36.7 C) (Oral)  Wt 124 lb (56.246 kg)  SpO2 98% Wt Readings from Last 3 Encounters:  06/01/14 124 lb (56.246 kg)  03/16/14 125 lb 8 oz (56.926 kg)  03/08/14 126 lb (57.153 kg)     General: Appears her stated age, tearful in NAD. Cardiovascular: Normal rate and rhythm. S1,S2 noted.  No murmur, rubs or gallops noted. No JVD or BLE edema. No carotid bruits noted. Pulmonary/Chest: Normal effort and positive vesicular breath sounds. No respiratory distress. No wheezes, rales or ronchi noted.  Abdomen: Soft and very tender in the LLQ. Hyperactive bowel sounds, no bruits noted. No distention or masses noted. Liver, spleen and kidneys non palpable.   BMET    Component Value Date/Time   NA 135 03/07/2014 0844   K 4.7 03/07/2014 0844   CL 99 03/07/2014 0844   CO2 26 03/07/2014 0844   GLUCOSE 96 03/07/2014 0844   BUN 15 03/07/2014 0844   CREATININE 0.9 03/07/2014 0844   CALCIUM 9.3 03/07/2014 0844    Lipid Panel     Component Value Date/Time   CHOL 196 03/02/2014 0829   TRIG 74.0 03/02/2014 0829   HDL 99.60 03/02/2014 0829   CHOLHDL 2 03/02/2014 0829   VLDL 14.8 03/02/2014 0829   LDLCALC 82 03/02/2014 0829    CBC    Component Value Date/Time   WBC 7.5 03/02/2014 0829   RBC 4.11 03/02/2014 0829   HGB 13.3 03/02/2014 0829   HCT 40.3 03/02/2014 0829   PLT 308.0 03/02/2014 0829   MCV 98.1 03/02/2014 0829   MCHC 33.0 03/02/2014 0829   RDW 13.7 03/02/2014 0829   LYMPHSABS 4.1* 03/02/2014 0829   MONOABS 0.6 03/02/2014 0829   EOSABS 0.1 03/02/2014 0829   BASOSABS 0.1 03/02/2014 0829    Hgb A1C No results found for this basename: HGBA1C       Assessment & Plan:   Diverticulitis:  Will defer CT scan at this time Will start Cipro/Flagy- if no improvement or worse, please call back and let me know- we will proceed with CT scan at that time Avoid foods with small seeds or nuts  RTC as needed or if symptoms persist or worsen

## 2014-06-20 ENCOUNTER — Other Ambulatory Visit (HOSPITAL_COMMUNITY): Payer: Self-pay | Admitting: *Deleted

## 2014-07-18 ENCOUNTER — Encounter: Payer: Self-pay | Admitting: Internal Medicine

## 2014-07-18 ENCOUNTER — Ambulatory Visit (INDEPENDENT_AMBULATORY_CARE_PROVIDER_SITE_OTHER): Payer: Medicare Other | Admitting: Internal Medicine

## 2014-07-18 VITALS — BP 104/68 | HR 91 | Temp 98.3°F | Resp 16 | Ht 64.0 in | Wt 127.0 lb

## 2014-07-18 DIAGNOSIS — N760 Acute vaginitis: Secondary | ICD-10-CM

## 2014-07-18 MED ORDER — FLUCONAZOLE 150 MG PO TABS: 150.0000 mg | ORAL_TABLET | Freq: Every day | ORAL | Status: DC

## 2014-07-18 MED ORDER — TRIAMCINOLONE ACETONIDE 0.1 % EX CREA: 1.0000 "application " | TOPICAL_CREAM | Freq: Two times a day (BID) | CUTANEOUS | Status: DC

## 2014-07-18 NOTE — Progress Notes (Signed)
Pre-visit discussion using our clinic review tool. No additional management support is needed unless otherwise documented below in the visit note.  

## 2014-07-18 NOTE — Progress Notes (Signed)
Patient ID: Veronica Ferrell, female   DOB: 25-Apr-1951, 63 y.o.   MRN: [id number]   Patient Active Problem List   Diagnosis Date Noted  . Vaginitis and vulvovaginitis 07/19/2014  . Encounter for preventive health examination 03/09/2014  . 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 Family history of colon cancer requiring screening colonoscopy 07/08/2013  . Chronic insomnia 07/08/2013  . Addison's disease   . Diverticulitis     Subjective:  CC:   Chief Complaint  Patient presents with  . Rash    itching vaginal area now legs arm and chest area. Itching in vaginal  area  after taking cipro and flagyl for diverticulitis.  . Breast Pain    left breast sore to touch for several weeks.    HPI:   Veronica Ferrell is a 63 y.o. female who presents for One month history of vaginal itching,  On and off foe the past month ,  Has not responded to vagisil , or neosporin,   Then on Sunday developed mild prutitic rash on biceps and legs,  After spending weekend at [place] June had a bad rash   Left breast has been sore for the past  Month.  Breast size has enlarged   Hydrocortisone dose has not changed 20 and 10,    a1c is elevated.    Past Medical History  Diagnosis Date  . GERD (gastroesophageal reflux disease)   . Colon polyps   . Diverticulitis   . Addison's disease   . Chicken pox     Past Surgical History  Procedure Laterality Date  . Varicose veins    . Breast surgery    . Appendectomy    . Abdominal hysterectomy    . Foot surgery Bilateral        The following portions of the patient's history were reviewed and updated as appropriate: Allergies, current medications, and problem list.    Review of Systems:   Patient denies headache, fevers, malaise, unintentional weight loss, skin rash, eye pain, sinus congestion and sinus pain, sore throat, dysphagia,  hemoptysis , cough, dyspnea, wheezing, chest pain, palpitations, orthopnea, edema, abdominal pain, nausea,  melena, diarrhea, constipation, flank pain, dysuria, hematuria, urinary  Frequency, nocturia, numbness, tingling, seizures,  Focal weakness, Loss of consciousness,  Tremor, insomnia, depression, anxiety, and suicidal ideation.     History   Social History  . Marital Status: Married    Spouse Name: N/A    Number of Children: N/A  . Years of Education: N/A   Occupational History  . Not on file.   Social History Main Topics  . Smoking status: Never Smoker   . Smokeless tobacco: Never Used  . Alcohol Use: 4.2 oz/week    7 Glasses of wine per week     Comment: [place]  . Drug Use: No  . Sexual Activity: Yes   Other Topics Concern  . Not on file   Social History Narrative  . No narrative on file    Objective:  Filed Vitals:   07/18/14 1551  BP: 104/68  Pulse: 91  Temp: 98.3 F (36.8 C)  Resp: 16    General Appearance:    Alert, cooperative, no distress, appears stated age  Head:    Normocephalic, without obvious abnormality, atraumatic  Eyes:    PERRL, conjunctiva/corneas clear, EOM's intact, fundi    benign, both eyes  Ears:    Normal TM's and external ear canals, both ears  Nose:  Nares normal, septum midline, mucosa normal, no drainage    or sinus tenderness  Throat:   Lips, mucosa, and tongue normal; teeth and gums normal  Neck:   Supple, symmetrical, trachea midline, no adenopathy;    thyroid:  no enlargement/tenderness/nodules; no carotid   bruit or JVD  Back:     Symmetric, no curvature, ROM normal, no CVA tenderness  Lungs:     Clear to auscultation bilaterally, respirations unlabored  Chest Wall:    No tenderness or deformity   Heart:    Regular rate and rhythm, S1 and S2 normal, no murmur, rub   or gallop     Abdomen:     Soft, non-tender, bowel sounds active all four quadrants,    no masses, no organomegaly  Genitalia:    Pelvic: cervix normal in appearance, external genitalia normal, no adnexal masses or tenderness, no cervical motion  tenderness, rectovaginal septum normal, uterus normal size, shape, and consistency and vagina normal without discharge  Extremities:   Extremities normal, atraumatic, no cyanosis or edema  Pulses:   2+ and symmetric all extremities  Skin:   Small annular scaling rash on vulva.   Lymph nodes:   Cervical, supraclavicular, and axillary nodes normal  Neurologic:   CNII-XII intact, normal strength, sensation and reflexes    throughout   .  Assessment and Plan:  Vaginitis and vulvovaginitis With normal internal exam and small rash on vulva of uncertain etiology.  Empiric trial of oral antifungal and topical triamcinolone   Updated Medication List Outpatient Encounter Prescriptions as of 07/18/2014  Medication Sig  . aspirin 81 MG tablet Take 81 mg by mouth daily.  . calcium citrate (CALCITRATE - DOSED IN MG ELEMENTAL CALCIUM) 950 MG tablet Take 1 tablet by mouth daily.  . Cholecalciferol (VITAMIN D3) 1000 UNITS CAPS Take 1 capsule by mouth daily.  . diazepam (VALIUM) 10 MG tablet Take 1 tablet (10 mg total) by mouth at bedtime as needed for anxiety.  Marland Kitchen estrogens, conjugated, (PREMARIN) 0.3 MG tablet Take 1 tablet (0.3 mg total) by mouth daily.  . fludrocortisone (FLORINEF) 0.1 MG tablet Take 1 tablet (0.1 mg total) by mouth daily.  . hydrocortisone (CORTEF) 10 MG tablet Take 1 tablet (10 mg total) by mouth 2 (two) times daily. 20 mg in the morning and 10 mg in the evening  . mirtazapine (REMERON) 30 MG tablet Take 1 tablet (30 mg total) by mouth at bedtime.  . Multiple Vitamins-Minerals (MULTIVITAMIN WITH MINERALS) tablet Take 1 tablet by mouth daily.  Marland Kitchen omeprazole (PRILOSEC) 20 MG capsule Take 1 capsule (20 mg total) by mouth daily.  . polyethylene glycol powder (GLYCOLAX/MIRALAX) powder Take 17 g by mouth daily.  . vitamin C (ASCORBIC ACID) 500 MG tablet Take 500 mg by mouth daily.  . ciprofloxacin (CIPRO) 500 MG tablet Take 1 tablet (500 mg total) by mouth 2 (two) times daily.  .  fluconazole (DIFLUCAN) 150 MG tablet Take 1 tablet (150 mg total) by mouth daily.  . metroNIDAZOLE (FLAGYL) 500 MG tablet Take 1 tablet (500 mg total) by mouth 3 (three) times daily.  Marland Kitchen triamcinolone cream (KENALOG) 0.1 % Apply 1 application topically 2 (two) times daily.     No orders of the defined types were placed in this encounter.    No Follow-up on file.

## 2014-07-18 NOTE — Patient Instructions (Addendum)
I am treating you for a yeast infection with oral fluconazole to take for two days  I am also treating you with a steroid cream to use twice daily on the rash spots which may be contact dermatitis  If the rash is from ringworm,  It will get worse ultimately  Call me in one week to let me know your progress

## 2014-07-19 DIAGNOSIS — N760 Acute vaginitis: Secondary | ICD-10-CM | POA: Insufficient documentation

## 2014-07-19 NOTE — Assessment & Plan Note (Signed)
With normal internal exam and small rash on vulva of uncertain etiology.  Empiric trial of oral antifungal and topical triamcinolone

## 2014-09-11 ENCOUNTER — Encounter: Payer: Self-pay | Admitting: Internal Medicine

## 2014-09-11 ENCOUNTER — Ambulatory Visit (INDEPENDENT_AMBULATORY_CARE_PROVIDER_SITE_OTHER): Payer: Medicare Other | Admitting: Internal Medicine

## 2014-09-11 VITALS — BP 102/64 | HR 75 | Temp 97.5°F | Resp 16 | Ht 64.0 in | Wt 126.0 lb

## 2014-09-11 DIAGNOSIS — Z23 Encounter for immunization: Secondary | ICD-10-CM

## 2014-09-11 DIAGNOSIS — K5730 Diverticulosis of large intestine without perforation or abscess without bleeding: Secondary | ICD-10-CM

## 2014-09-11 DIAGNOSIS — E271 Primary adrenocortical insufficiency: Secondary | ICD-10-CM

## 2014-09-11 DIAGNOSIS — G4700 Insomnia, unspecified: Secondary | ICD-10-CM

## 2014-09-11 DIAGNOSIS — E2749 Other adrenocortical insufficiency: Secondary | ICD-10-CM

## 2014-09-11 DIAGNOSIS — F5104 Psychophysiologic insomnia: Secondary | ICD-10-CM

## 2014-09-11 MED ORDER — ALPRAZOLAM 1 MG PO TABS: 1.0000 mg | ORAL_TABLET | Freq: Every evening | ORAL | Status: DC | PRN

## 2014-09-11 NOTE — Patient Instructions (Addendum)
I am giving you an alternative the the valium .  Alprazolam.  Start with 1/2 tablet  At bedtime ,  May also be taken at 3 am for insomnia   But First:  Reduce the remeron to 15 mg at bedtime .  Call for new rx if the 30 mg tablet is not scored.   [phone]   You can reduce your aspirin to once a week to help the bruising resolve   I recommend getting the majority of your calcium and Vitamin D  through diet rather than supplements given the recent association of calcium supplements with increased coronary artery calcium scores (You need 1200 mg daily )   Unsweetened almond/coconut milk is a great low calorie low carb, cholesterol free  way to increase your dietary calcium and vitamin D.  Try the blue Jackquline Bosch  We will repeat your DEXA scan this year at [facility]

## 2014-09-11 NOTE — Progress Notes (Signed)
Patient ID: Veronica Ferrell, female   DOB: Nov 09, 1951, 63 y.o.   MRN: [id number]   Patient Active Problem List   Diagnosis Date Noted  . Vaginitis and vulvovaginitis 07/19/2014  . Encounter for preventive health examination 03/09/2014  . 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 Family history of colon cancer requiring screening colonoscopy 07/08/2013  . Chronic insomnia 07/08/2013  . Addison's disease   . Diverticulosis of colon without hemorrhage     Subjective:  CC:   Chief Complaint  Patient presents with  . Follow-up    6 month follow up, diverticulitis, and vaginitis.    HPI:   Veronica Ferrell is a 63 y.o. female who presents for Follow up on chronic conditions. Including diverticulosis and atrophic vaginitis.Her previously reported symptoms have resolved and she feels generally good..  She continues to suffer from insomnia, which is chronic. She uses valium sparingly .  Has been using it for years..  Did not tolerate ambien due to side effects.  the valium is effective in stopping the constant undercurrent of worrying thoughts that prevent her fro falling asleep but the way she is using it  As prn,  She waits an hour, then takes the medication and  it takes another hour for her to fall asleep.  Husband's snoring aggravates her issue. Marland Kitchen    Past Medical History  Diagnosis Date  . GERD (gastroesophageal reflux disease)   . Colon polyps   . Diverticulitis   . Addison's disease   . Chicken pox     Past Surgical History  Procedure Laterality Date  . Varicose veins    . Breast surgery    . Appendectomy    . Abdominal hysterectomy    . Foot surgery Bilateral        The following portions of the patient's history were reviewed and updated as appropriate: Allergies, current medications, and problem list.    Review of Systems:   Patient denies headache, fevers, malaise, unintentional weight loss, skin rash, eye pain, sinus congestion and sinus pain, sore throat,  dysphagia,  hemoptysis , cough, dyspnea, wheezing, chest pain, palpitations, orthopnea, edema, abdominal pain, nausea, melena, diarrhea, constipation, flank pain, dysuria, hematuria, urinary  Frequency, nocturia, numbness, tingling, seizures,  Focal weakness, Loss of consciousness,  Tremor, insomnia, depression, anxiety, and suicidal ideation.     History   Social History  . Marital Status: Married    Spouse Name: N/A    Number of Children: N/A  . Years of Education: N/A   Occupational History  . Not on file.   Social History Main Topics  . Smoking status: Never Smoker   . Smokeless tobacco: Never Used  . Alcohol Use: 4.2 oz/week    7 Glasses of wine per week     Comment: [place]  . Drug Use: No  . Sexual Activity: Yes   Other Topics Concern  . Not on file   Social History Narrative  . No narrative on file    Objective:  Filed Vitals:   09/11/14 0909  BP: 102/64  Pulse: 75  Temp: 97.5 F (36.4 C)  Resp: 16     General appearance: alert, cooperative and appears stated age Ears: normal TM's and external ear canals both ears Throat: lips, mucosa, and tongue normal; teeth and gums normal Neck: no adenopathy, no carotid bruit, supple, symmetrical, trachea midline and thyroid not enlarged, symmetric, no tenderness/mass/nodules Back: symmetric, no curvature. ROM normal. No CVA tenderness. Lungs: clear to auscultation bilaterally  Heart: regular rate and rhythm, S1, S2 normal, no murmur, click, rub or gallop Abdomen: soft, non-tender; bowel sounds normal; no masses,  no organomegaly Pulses: 2+ and symmetric Skin: Skin color, texture, turgor normal. No rashes or lesions Lymph nodes: Cervical, supraclavicular, and axillary nodes normal.  Assessment and Plan:  Addison's disease Managed with hydrocortisone.  Asymptomatic and stable   Diverticulosis of colon without hemorrhage No recent episodes..   Chronic insomnia Discussed trial of alprazolam for faster  onset of relief. The risks and benefits of benzodiazepine use were discussed with patient today including excessive sedation leading to respiratory depression,  impaired thinking/driving, and addiction.  Patient was advised to avoid concurrent use with alcohol, to use medication only as needed and not to share with others  .    Updated Medication List Outpatient Encounter Prescriptions as of 09/11/2014  Medication Sig  . ALPRAZolam (XANAX) 1 MG tablet Take 1 tablet (1 mg total) by mouth at bedtime as needed for anxiety.  Marland Kitchen aspirin 81 MG tablet Take 81 mg by mouth daily.  . calcium citrate (CALCITRATE - DOSED IN MG ELEMENTAL CALCIUM) 950 MG tablet Take 1 tablet by mouth daily.  . Cholecalciferol (VITAMIN D3) 1000 UNITS CAPS Take 1 capsule by mouth daily.  . diazepam (VALIUM) 10 MG tablet Take 1 tablet (10 mg total) by mouth at bedtime as needed for anxiety.  Marland Kitchen estrogens, conjugated, (PREMARIN) 0.3 MG tablet Take 1 tablet (0.3 mg total) by mouth daily.  . fludrocortisone (FLORINEF) 0.1 MG tablet Take 1 tablet (0.1 mg total) by mouth daily.  . hydrocortisone (CORTEF) 10 MG tablet Take 1 tablet (10 mg total) by mouth 2 (two) times daily. 20 mg in the morning and 10 mg in the evening  . mirtazapine (REMERON) 30 MG tablet Take 1 tablet (30 mg total) by mouth at bedtime.  . Multiple Vitamins-Minerals (MULTIVITAMIN WITH MINERALS) tablet Take 1 tablet by mouth daily.  Marland Kitchen omeprazole (PRILOSEC) 20 MG capsule Take 1 capsule (20 mg total) by mouth daily.  . polyethylene glycol powder (GLYCOLAX/MIRALAX) powder Take 17 g by mouth daily.  . vitamin C (ASCORBIC ACID) 500 MG tablet Take 500 mg by mouth daily.  . [DISCONTINUED] ciprofloxacin (CIPRO) 500 MG tablet Take 1 tablet (500 mg total) by mouth 2 (two) times daily.  . [DISCONTINUED] fluconazole (DIFLUCAN) 150 MG tablet Take 1 tablet (150 mg total) by mouth daily.  . [DISCONTINUED] metroNIDAZOLE (FLAGYL) 500 MG tablet Take 1 tablet (500 mg total) by mouth 3  (three) times daily.  . [DISCONTINUED] triamcinolone cream (KENALOG) 0.1 % Apply 1 application topically 2 (two) times daily.     Orders Placed This Encounter  Procedures  . Pneumococcal conjugate vaccine 13-valent    Return in about 6 months (around 03/12/2015).

## 2014-09-11 NOTE — Progress Notes (Signed)
Pre-visit discussion using our clinic review tool. No additional management support is needed unless otherwise documented below in the visit note.  

## 2014-09-13 NOTE — Assessment & Plan Note (Signed)
Discussed trial of alprazolam for faster onset of relief. The risks and benefits of benzodiazepine use were discussed with patient today including excessive sedation leading to respiratory depression,  impaired thinking/driving, and addiction.  Patient was advised to avoid concurrent use with alcohol, to use medication only as needed and not to share with others  .

## 2014-09-13 NOTE — Assessment & Plan Note (Signed)
Managed with hydrocortisone.  Asymptomatic and stable . 

## 2014-09-13 NOTE — Assessment & Plan Note (Signed)
No recent episodes

## 2014-09-22 ENCOUNTER — Encounter: Payer: Self-pay | Admitting: Internal Medicine

## 2014-09-22 DIAGNOSIS — Z13820 Encounter for screening for osteoporosis: Secondary | ICD-10-CM

## 2014-09-22 NOTE — Telephone Encounter (Signed)
Needs DEXA ordered and printed.

## 2014-09-22 NOTE — Telephone Encounter (Signed)
Order faxed to [facility]

## 2014-09-22 NOTE — Telephone Encounter (Signed)
DEXA has been ordered and printed

## 2014-09-26 ENCOUNTER — Encounter: Payer: Self-pay | Admitting: Internal Medicine

## 2014-09-27 ENCOUNTER — Other Ambulatory Visit: Payer: Self-pay | Admitting: Internal Medicine

## 2014-09-27 MED ORDER — FLUCONAZOLE 150 MG PO TABS: 150.0000 mg | ORAL_TABLET | Freq: Every day | ORAL | Status: DC

## 2014-09-27 MED ORDER — TRIAMCINOLONE ACETONIDE 0.1 % EX CREA: 1.0000 "application " | TOPICAL_CREAM | Freq: Two times a day (BID) | CUTANEOUS | Status: DC

## 2014-10-05 LAB — HM DEXA SCAN

## 2014-10-23 ENCOUNTER — Telehealth: Payer: Self-pay | Admitting: Internal Medicine

## 2014-10-23 DIAGNOSIS — N760 Acute vaginitis: Secondary | ICD-10-CM

## 2014-10-23 NOTE — Telephone Encounter (Signed)
Please copy my notes and send to [facility]  [facility],  I have made the appt for patient Wednesday 8 am

## 2014-10-24 ENCOUNTER — Telehealth: Payer: Self-pay | Admitting: Internal Medicine

## 2014-10-24 NOTE — Telephone Encounter (Signed)
She has not chekced her

## 2014-11-05 ENCOUNTER — Telehealth: Payer: Self-pay | Admitting: Internal Medicine

## 2014-11-05 DIAGNOSIS — M8580 Other specified disorders of bone density and structure, unspecified site: Secondary | ICD-10-CM

## 2014-11-05 NOTE — Telephone Encounter (Signed)
Bone Density scores received from [facility]. , she has osteopenia,  Wiith no significant change from 2014.   Her 10 year risk of major fracture is 12% Would repeat in 2 years and consider therapy then if there is a significant change. Continue calcium, vitamin d and weight bearing exercise on a regular basis.

## 2014-11-06 NOTE — Telephone Encounter (Signed)
Sent my chart with results. 

## 2014-12-27 ENCOUNTER — Encounter: Payer: Self-pay | Admitting: Internal Medicine

## 2014-12-27 NOTE — Telephone Encounter (Signed)
PA started online, pending response from patient regarding other medications tried.

## 2014-12-27 NOTE — Telephone Encounter (Signed)
PA completed online, pending response. 

## 2015-01-04 ENCOUNTER — Ambulatory Visit: Payer: Self-pay | Admitting: Internal Medicine

## 2015-01-04 LAB — HM MAMMOGRAPHY: HM MAMMO: NEGATIVE

## 2015-01-05 ENCOUNTER — Encounter: Payer: Self-pay | Admitting: *Deleted

## 2015-01-19 ENCOUNTER — Encounter: Payer: Self-pay | Admitting: Internal Medicine

## 2015-01-23 ENCOUNTER — Encounter: Payer: Self-pay | Admitting: Internal Medicine

## 2015-01-23 DIAGNOSIS — M5431 Sciatica, right side: Secondary | ICD-10-CM

## 2015-01-23 NOTE — Telephone Encounter (Signed)
Tier Exception printed and in Tullo's box for completion

## 2015-01-24 ENCOUNTER — Encounter: Payer: Self-pay | Admitting: Internal Medicine

## 2015-01-24 DIAGNOSIS — N951 Menopausal and female climacteric states: Secondary | ICD-10-CM | POA: Insufficient documentation

## 2015-01-26 ENCOUNTER — Telehealth: Payer: Self-pay

## 2015-01-26 NOTE — Telephone Encounter (Signed)
PA for premarin Rx has been denied.

## 2015-02-27 ENCOUNTER — Telehealth: Payer: Self-pay | Admitting: Internal Medicine

## 2015-02-27 DIAGNOSIS — Z0000 Encounter for general adult medical examination without abnormal findings: Secondary | ICD-10-CM

## 2015-02-27 NOTE — Telephone Encounter (Signed)
Labs entered.

## 2015-02-27 NOTE — Telephone Encounter (Signed)
Last Labs 03/07/14 ok to enter fasting same as last wear?

## 2015-02-27 NOTE — Telephone Encounter (Signed)
Lipids, cmet tsh and CBC .  Thank you

## 2015-02-27 NOTE — Telephone Encounter (Signed)
Pt request to come in for lab work prior to [employer] visit. Pt has been scheduled for lab work. No orders in system.msn

## 2015-03-09 ENCOUNTER — Other Ambulatory Visit (INDEPENDENT_AMBULATORY_CARE_PROVIDER_SITE_OTHER): Payer: Medicare Other

## 2015-03-09 DIAGNOSIS — M859 Disorder of bone density and structure, unspecified: Secondary | ICD-10-CM | POA: Diagnosis not present

## 2015-03-09 DIAGNOSIS — Z0000 Encounter for general adult medical examination without abnormal findings: Secondary | ICD-10-CM | POA: Diagnosis not present

## 2015-03-09 DIAGNOSIS — Z79899 Other long term (current) drug therapy: Secondary | ICD-10-CM | POA: Diagnosis not present

## 2015-03-09 LAB — LIPID PANEL
CHOL/HDL RATIO: 2
Cholesterol: 192 mg/dL (ref 0–200)
HDL: 98.6 mg/dL (ref >39.00)
LDL Cholesterol: 78 mg/dL (ref 0–99)
NONHDL: 93.4
Triglycerides: 77 mg/dL (ref 0.0–149.0)
VLDL: 15.4 mg/dL (ref 0.0–40.0)

## 2015-03-09 LAB — CBC WITH DIFFERENTIAL/PLATELET
BASOS ABS: 0.1 10*3/uL (ref 0.0–0.1)
Basophils Relative: 0.7 % (ref 0.0–3.0)
EOS ABS: 0.2 10*3/uL (ref 0.0–0.7)
Eosinophils Relative: 1.7 % (ref 0.0–5.0)
HCT: 42.6 % (ref 36.0–46.0)
HEMOGLOBIN: 14.4 g/dL (ref 12.0–15.0)
LYMPHS PCT: 51.9 % — AB (ref 12.0–46.0)
Lymphs Abs: 4.6 10*3/uL — ABNORMAL HIGH (ref 0.7–4.0)
MCHC: 33.7 g/dL (ref 30.0–36.0)
MCV: 96.1 fl (ref 78.0–100.0)
Monocytes Absolute: 0.8 10*3/uL (ref 0.1–1.0)
Monocytes Relative: 8.7 % (ref 3.0–12.0)
NEUTROS PCT: 37 % — AB (ref 43.0–77.0)
Neutro Abs: 3.3 10*3/uL (ref 1.4–7.7)
PLATELETS: 344 10*3/uL (ref 150.0–400.0)
RBC: 4.43 Mil/uL (ref 3.87–5.11)
RDW: 13.3 % (ref 11.5–15.5)
WBC: 8.9 10*3/uL (ref 4.0–10.5)

## 2015-03-09 LAB — COMPREHENSIVE METABOLIC PANEL
ALBUMIN: 4.2 g/dL (ref 3.5–5.2)
ALK PHOS: 29 U/L — AB (ref 39–117)
ALT: 12 U/L (ref 0–35)
AST: 17 U/L (ref 0–37)
BUN: 24 mg/dL — ABNORMAL HIGH (ref 6–23)
CHLORIDE: 99 meq/L (ref 96–112)
CO2: 30 mEq/L (ref 19–32)
Calcium: 9.4 mg/dL (ref 8.4–10.5)
Creatinine, Ser: 0.84 mg/dL (ref 0.40–1.20)
GFR: 72.12 mL/min (ref >60.00)
GLUCOSE: 88 mg/dL (ref 70–99)
POTASSIUM: 4.3 meq/L (ref 3.5–5.1)
SODIUM: 133 meq/L — AB (ref 135–145)
TOTAL PROTEIN: 6.9 g/dL (ref 6.0–8.3)
Total Bilirubin: 0.5 mg/dL (ref 0.2–1.2)

## 2015-03-09 LAB — VITAMIN D 25 HYDROXY (VIT D DEFICIENCY, FRACTURES): VITD: 43.18 ng/mL (ref 30.00–100.00)

## 2015-03-09 LAB — TSH: TSH: 4.24 u[IU]/mL (ref 0.35–4.50)

## 2015-03-11 ENCOUNTER — Encounter: Payer: Self-pay | Admitting: Internal Medicine

## 2015-03-13 ENCOUNTER — Other Ambulatory Visit: Payer: Self-pay

## 2015-03-15 ENCOUNTER — Ambulatory Visit (INDEPENDENT_AMBULATORY_CARE_PROVIDER_SITE_OTHER): Payer: Self-pay | Admitting: Internal Medicine

## 2015-03-15 ENCOUNTER — Encounter: Payer: Self-pay | Admitting: Internal Medicine

## 2015-03-15 VITALS — BP 98/60 | HR 68 | Temp 97.8°F | Resp 14 | Ht 64.75 in | Wt 122.8 lb

## 2015-03-15 DIAGNOSIS — K5730 Diverticulosis of large intestine without perforation or abscess without bleeding: Secondary | ICD-10-CM

## 2015-03-15 DIAGNOSIS — R1032 Left lower quadrant pain: Secondary | ICD-10-CM | POA: Diagnosis not present

## 2015-03-15 DIAGNOSIS — Z0000 Encounter for general adult medical examination without abnormal findings: Secondary | ICD-10-CM | POA: Diagnosis not present

## 2015-03-15 DIAGNOSIS — K5732 Diverticulitis of large intestine without perforation or abscess without bleeding: Secondary | ICD-10-CM | POA: Diagnosis not present

## 2015-03-15 LAB — CBC WITH DIFFERENTIAL/PLATELET
BASOS PCT: 0.5 % (ref 0.0–3.0)
Basophils Absolute: 0 10*3/uL (ref 0.0–0.1)
EOS ABS: 0.1 10*3/uL (ref 0.0–0.7)
EOS PCT: 1.4 % (ref 0.0–5.0)
HEMATOCRIT: 42.5 % (ref 36.0–46.0)
Hemoglobin: 14.3 g/dL (ref 12.0–15.0)
Lymphocytes Relative: 40.4 % (ref 12.0–46.0)
Lymphs Abs: 3.7 10*3/uL (ref 0.7–4.0)
MCHC: 33.7 g/dL (ref 30.0–36.0)
MCV: 96.3 fl (ref 78.0–100.0)
MONO ABS: 0.7 10*3/uL (ref 0.1–1.0)
Monocytes Relative: 8.2 % (ref 3.0–12.0)
Neutro Abs: 4.5 10*3/uL (ref 1.4–7.7)
Neutrophils Relative %: 49.5 % (ref 43.0–77.0)
Platelets: 333 10*3/uL (ref 150.0–400.0)
RBC: 4.42 Mil/uL (ref 3.87–5.11)
RDW: 13.4 % (ref 11.5–15.5)
WBC: 9.1 10*3/uL (ref 4.0–10.5)

## 2015-03-15 LAB — SEDIMENTATION RATE: SED RATE: 8 mm/h (ref 0–22)

## 2015-03-15 LAB — C-REACTIVE PROTEIN: CRP: 0.1 mg/dL — AB (ref 0.5–20.0)

## 2015-03-15 MED ORDER — HYDROCOD POLST-CHLORPHEN POLST 10-8 MG/5ML PO LQCR: 5.0000 mL | Freq: Every evening | ORAL | Status: DC | PRN

## 2015-03-15 MED ORDER — LEVOFLOXACIN 500 MG PO TABS: 500.0000 mg | ORAL_TABLET | Freq: Every day | ORAL | Status: DC

## 2015-03-15 MED ORDER — PROMETHAZINE HCL 12.5 MG PO TABS: 12.5000 mg | ORAL_TABLET | Freq: Four times a day (QID) | ORAL | Status: DC | PRN

## 2015-03-15 NOTE — Progress Notes (Signed)
Pre-visit discussion using our clinic review tool. No additional management support is needed unless otherwise documented below in the visit note.  

## 2015-03-15 NOTE — Assessment & Plan Note (Addendum)
Progressive,  With history of diverticulitis last summer.  Increase miralax to daily,

## 2015-03-15 NOTE — Progress Notes (Signed)
Patient ID: Veronica Ferrell, female   DOB: November 16, 1951, 64 y.o.   MRN: [id number]   The patient is here for annual Medicare wellness examination and management of other chronic and acute problems.  She is s/p hysterectomy .    Has been having episoes of LLQ discomfort, described as dull and mild ,  Relieved by defecating.  Has multiple formed stools daily, alternating between pellets,small caliber and large caliber logs. No bleeding .  Last episode  of diverticulitis occurred  last summer.    The risk factors are reflected in the social history.  The roster of all physicians providing medical care to patient - is listed in the Snapshot section of the chart.  Activities of daily living:  The patient is 100% independent in all ADLs: dressing, toileting, feeding as well as independent mobility  Home safety : The patient has smoke detectors in the home. They wear seatbelts.  There are no firearms at home. There is no violence in the home.   There is no risks for hepatitis, STDs or HIV. There is no   history of blood transfusion. They have no travel history to infectious disease endemic areas of the world.  The patient has seen their dentist in the last six month. They have seen their eye doctor in the last year. They admit to slight hearing difficulty with regard to whispered voices and some television programs.  They have deferred audiologic testing in the last year.  They do not  have excessive sun exposure. Discussed the need for sun protection: hats, long sleeves and use of sunscreen if there is significant sun exposure.   Diet: the importance of a healthy diet is discussed. They do have a healthy diet.  The benefits of regular aerobic exercise were discussed. She walks 4 times per week ,  20 minutes.   Depression screen: there are no signs or vegative symptoms of depression- irritability, change in appetite, anhedonia, sadness/tearfullness.  Cognitive assessment: the patient manages all their  financial and personal affairs and is actively engaged. They could relate day,date,year and events; recalled 2/3 objects at 3 minutes; performed clock-face test normally.  The following portions of the patient's history were reviewed and updated as appropriate: allergies, current medications, past family history, past medical history,  past surgical history, past social history  and problem list.  Visual acuity was not assessed per patient preference since she has regular follow up with her ophthalmologist. Hearing and body mass index were assessed and reviewed.   During the course of the visit the patient was educated and counseled about appropriate screening and preventive services including : fall prevention , diabetes screening, nutrition counseling, colorectal cancer screening, and recommended immunizations.    Review of systems:  Patient denies headache, fevers, malaise, unintentional weight loss, skin rash, eye pain, sinus congestion and sinus pain, sore throat, dysphagia,  hemoptysis , cough, dyspnea, wheezing, chest pain, palpitations, orthopnea, edema,  nausea, melena, diarrhea,  dysuria, hematuria, urinary  Frequency, nocturia, numbness, tingling, seizures,  Focal weakness, Loss of consciousness,  Tremor, insomnia, depression, anxiety, and suicidal ideation.    Objective:  BP 98/60 mmHg  Pulse 68  Temp(Src) 97.8 F (36.6 C) (Oral)  Resp 14  Ht 5' 4.75" (1.645 m)  Wt 122 lb 12 oz (55.679 kg)  BMI 20.58 kg/m2  SpO2 98%  General appearance: alert, cooperative and appears stated age Head: Normocephalic, without obvious abnormality, atraumatic Eyes: conjunctivae/corneas clear. PERRL, EOM's intact. Fundi benign. Ears: normal TM's and external  ear canals both ears Nose: Nares normal. Septum midline. Mucosa normal. No drainage or sinus tenderness. Throat: lips, mucosa, and tongue normal; teeth and gums normal Neck: no adenopathy, no carotid bruit, no JVD, supple, symmetrical, trachea  midline and thyroid not enlarged, symmetric, no tenderness/mass/nodules Lungs: clear to auscultation bilaterally Breasts: normal appearance, no masses or tenderness Heart: regular rate and rhythm, S1, S2 normal, no murmur, click, rub or gallop Abdomen: soft, non-tender; bowel sounds normal; no masses,  no organomegaly Extremities: extremities normal, atraumatic, no cyanosis or edema Pulses: 2+ and symmetric Skin: Skin color, texture, turgor normal. No rashes or lesions Neurologic: Alert and oriented X 3, normal strength and tone. Normal symmetric reflexes. Normal coordination and gait.   Assessment and Plan:    Problem List Items Addressed This Visit      Unprioritized   Encounter for preventive health examination    Annual Medicare wellness  exam was done as well as a comprehensive physical exam and management of acute and chronic conditions .  During the course of the visit the patient was educated and counseled about appropriate screening and preventive services including : fall prevention , diabetes screening, nutrition counseling, colorectal cancer screening, and recommended immunizations.  Printed recommendations for health maintenance screenings was given.       Diverticulosis of colon without hemorrhage    Current symptoms are mild, and there is no evidence of diverticultis by inflammatory markers or CBC.  Suggested treatment of constipation       Abdominal pain, left lower quadrant - Primary    Progressive,  With history of diverticulitis last summer.  Increase miralax to daily,        Relevant Orders   CBC with Differential/Platelet (Completed)   C-reactive protein (Completed)   Sedimentation rate (Completed)    Other Visit Diagnoses    Diverticulitis of colon        Relevant Medications    levofloxacin (LEVAQUIN) tablet    promethazine (PHENERGAN)  tablet    Other Relevant Orders    CBC with Differential/Platelet (Completed)    C-reactive protein (Completed)     Sedimentation rate (Completed)

## 2015-03-15 NOTE — Patient Instructions (Addendum)
You can either try famotidine (pepcid) 20 to 40 mg daily,   or ranitidine (generic zantac)  150 to 300 mg daily   Instead of omeprazole.  These medications are H2 blockers that are used to treat gastritis   I am checking your markers of inflammation today because or your increased LLQ pain.  You do not need antibiotics unless you develop fevers, or unless your labs suggest diverticulitis   Please try using the miralax on a daily basis to keep you from becoming constipated  Take the levaquin and phenergan with you to [place] .  The levaquin will treat sinusitis, bronchitis,  UTI,  And diarrhea if you are so unlucky

## 2015-03-18 NOTE — Assessment & Plan Note (Signed)
Current symptoms are mild, and there is no evidence of diverticultis by inflammatory markers or CBC.  Suggested treatment of constipation

## 2015-03-18 NOTE — Assessment & Plan Note (Signed)

## 2015-04-07 ENCOUNTER — Other Ambulatory Visit: Payer: Self-pay | Admitting: Internal Medicine

## 2015-04-30 ENCOUNTER — Other Ambulatory Visit: Payer: Self-pay | Admitting: Internal Medicine

## 2015-06-18 ENCOUNTER — Telehealth: Payer: Self-pay | Admitting: Internal Medicine

## 2015-06-18 NOTE — Telephone Encounter (Signed)
Confirmed appointment

## 2015-06-18 NOTE — Telephone Encounter (Signed)
Please schedule ms Kemmerling for 4:30 on Wednesday,  Acute,  For insomnia

## 2015-06-20 ENCOUNTER — Ambulatory Visit (INDEPENDENT_AMBULATORY_CARE_PROVIDER_SITE_OTHER): Payer: Medicare Other | Admitting: Internal Medicine

## 2015-06-20 ENCOUNTER — Encounter: Payer: Self-pay | Admitting: Internal Medicine

## 2015-06-20 VITALS — BP 100/60 | HR 88 | Temp 98.1°F | Resp 12 | Ht 65.0 in | Wt 123.5 lb

## 2015-06-20 DIAGNOSIS — F5104 Psychophysiologic insomnia: Secondary | ICD-10-CM

## 2015-06-20 DIAGNOSIS — G4700 Insomnia, unspecified: Secondary | ICD-10-CM

## 2015-06-20 NOTE — Progress Notes (Signed)
Subjective:  Patient ID: Veronica Ferrell, female    DOB: 10/13/1951  Age: 64 y.o. MRN: [id number]  CC: The encounter diagnosis was Chronic insomnia.  HPI Veronica Ferrell presents for consultation regarding worsening insomnia.  She has had chronic insomnia for years which until recently responded to mirtazipine 30 mg .  Recent has been having more hot flashes,  Complicated by restless legs feeling , leading to prolonged sleep latency,  Has had to resort to using 1/2 alprazolam tablet for several days in a row and is worried about dependency. Still taking remeron 30 mg qhs, takes a vitamin C at night .  Diet and caffeine intake reviewed,  Sleep habits reviewed,  All are non contributory  Insomnia  appears to be aggravated by anticipated social events and has been problematic since her vacation in [place]  In April.   Outpatient Prescriptions Prior to Visit  Medication Sig Dispense Refill  . ALPRAZolam (XANAX) 1 MG tablet Take 1 tablet (1 mg total) by mouth at bedtime as needed for anxiety. 30 tablet 2  . aspirin 81 MG tablet Take 81 mg by mouth daily.    . calcium citrate (CALCITRATE - DOSED IN MG ELEMENTAL CALCIUM) 950 MG tablet Take 1 tablet by mouth daily.    . Cholecalciferol (VITAMIN D3) 1000 UNITS CAPS Take 1 capsule by mouth daily.    . fludrocortisone (FLORINEF) 0.1 MG tablet TAKE 1 TABLET EVERY DAY 90 tablet 1  . mirtazapine (REMERON) 30 MG tablet TAKE ONE TABLET AT BEDTIME 90 tablet 0  . hydrocortisone (CORTEF) 10 MG tablet TAKE TWO TABLETS EVERY MORNING AND TAKE ONE TABLET EVERY EVENING 270 tablet 2  . Multiple Vitamins-Minerals (MULTIVITAMIN WITH MINERALS) tablet Take 1 tablet by mouth daily.    . polyethylene glycol powder (GLYCOLAX/MIRALAX) powder Take 17 g by mouth daily.    Marland Kitchen PREMARIN 0.3 MG tablet TAKE 1 TABLET EVERY DAY 90 tablet 3  . triamcinolone cream (KENALOG) 0.1 % Apply 1 application topically 2 (two) times daily. (Patient not taking: Reported on 03/15/2015) 454 g 0  .  vitamin C (ASCORBIC ACID) 500 MG tablet Take 500 mg by mouth daily.    . chlorpheniramine-HYDROcodone (TUSSIONEX) 10-8 MG/5ML LQCR Take 5 mLs by mouth at bedtime as needed for cough. 115 mL 0  . diazepam (VALIUM) 10 MG tablet Take 1 tablet (10 mg total) by mouth at bedtime as needed for anxiety. (Patient not taking: Reported on 06/20/2015) 30 tablet 3  . fluconazole (DIFLUCAN) 150 MG tablet Take 1 tablet (150 mg total) by mouth daily. (Patient not taking: Reported on 03/15/2015) 2 tablet 0  . levofloxacin (LEVAQUIN) 500 MG tablet Take 1 tablet (500 mg total) by mouth daily. 7 tablet 0  . omeprazole (PRILOSEC) 20 MG capsule Take 1 capsule (20 mg total) by mouth daily. (Patient not taking: Reported on 06/20/2015) 90 capsule 3  . promethazine (PHENERGAN) 12.5 MG tablet Take 1 tablet (12.5 mg total) by mouth every 6 (six) hours as needed for nausea or vomiting. (Patient not taking: Reported on 06/20/2015) 30 tablet 0   No facility-administered medications prior to visit.    Review of Systems;  Patient denies headache, fevers, malaise, unintentional weight loss, skin rash, eye pain, sinus congestion and sinus pain, sore throat, dysphagia,  hemoptysis , cough, dyspnea, wheezing, chest pain, palpitations, orthopnea, edema, abdominal pain, nausea, melena, diarrhea, constipation, flank pain, dysuria, hematuria, urinary  Frequency, nocturia, numbness, tingling, seizures,  Focal weakness, Loss of consciousness,  Tremor,  depression, anxiety, and suicidal ideation.      Objective:  BP 100/60 mmHg  Pulse 88  Temp(Src) 98.1 F (36.7 C) (Oral)  Resp 12  Ht 5\' 5"  (1.651 m)  Wt 123 lb 8 oz (56.019 kg)  BMI 20.55 kg/m2  SpO2 96%  BP Readings from Last 3 Encounters:  06/20/15 100/60  03/15/15 98/60  09/11/14 102/64    Wt Readings from Last 3 Encounters:  06/20/15 123 lb 8 oz (56.019 kg)  03/15/15 122 lb 12 oz (55.679 kg)  09/11/14 126 lb (57.153 kg)    General appearance: alert, cooperative and  appears stated age Ears: normal TM's and external ear canals both ears Throat: lips, mucosa, and tongue normal; teeth and gums normal Neck: no adenopathy, no carotid bruit, supple, symmetrical, trachea midline and thyroid not enlarged, symmetric, no tenderness/mass/nodules Back: symmetric, no curvature. ROM normal. No CVA tenderness. Lungs: clear to auscultation bilaterally Heart: regular rate and rhythm, S1, S2 normal, no murmur, click, rub or gallop Abdomen: soft, non-tender; bowel sounds normal; no masses,  no organomegaly Pulses: 2+ and symmetric Skin: Skin color, texture, turgor normal. No rashes or lesions Lymph nodes: Cervical, supraclavicular, and axillary nodes normal.  No results found for: HGBA1C  Lab Results  Component Value Date   CREATININE 0.84 03/09/2015   CREATININE 0.9 03/07/2014   CREATININE 0.9 03/02/2014    Lab Results  Component Value Date   WBC 9.1 03/15/2015   HGB 14.3 03/15/2015   HCT 42.5 03/15/2015   PLT 333.0 03/15/2015   GLUCOSE 88 03/09/2015   CHOL 192 03/09/2015   TRIG 77.0 03/09/2015   HDL 98.60 03/09/2015   LDLCALC 78 03/09/2015   ALT 12 03/09/2015   AST 17 03/09/2015   NA 133* 03/09/2015   K 4.3 03/09/2015   CL 99 03/09/2015   CREATININE 0.84 03/09/2015   BUN 24* 03/09/2015   CO2 30 03/09/2015   TSH 4.24 03/09/2015    No results found.  Assessment & Plan:   Problem List Items Addressed This Visit      Unprioritized   Chronic insomnia - Primary    Several options discussed,  Including adding melatonin 5 mg in the early evening,  Reducing remeron to 15 mg and treating RLS.  Alprazolam taper discussed.        A total of 25 minutes of face to face time was spent with patient more than half of which was spent in counselling about the above mentioned conditions  and coordination of care   I have discontinued Ms. Bergin's diazepam, omeprazole, fluconazole, chlorpheniramine-HYDROcodone, levofloxacin, and promethazine. I am also  having her maintain her polyethylene glycol powder, aspirin, Vitamin D3, calcium citrate, multivitamin with minerals, vitamin C, ALPRAZolam, triamcinolone cream, mirtazapine, PREMARIN, hydrocortisone, and fludrocortisone.  No orders of the defined types were placed in this encounter.    Medications Discontinued During This Encounter  Medication Reason  . diazepam (VALIUM) 10 MG tablet Patient Preference  . fluconazole (DIFLUCAN) 150 MG tablet Completed Course  . levofloxacin (LEVAQUIN) 500 MG tablet Completed Course  . chlorpheniramine-HYDROcodone (TUSSIONEX) 10-8 MG/5ML [facility] Patient Preference  . omeprazole (PRILOSEC) 20 MG capsule Error  . promethazine (PHENERGAN) 12.5 MG tablet Error    Follow-up: No Follow-up on file.   Crecencio Mc, MD

## 2015-06-20 NOTE — Patient Instructions (Addendum)
Try adding melatonin up to 6 mg daily around 7 pm  Try reducing remeron to 15 mg at night  Xanax taper:   1/2  Dose  daily for  A period of time,  Then 1/2 dose  every other day for same period of time then stop   Restless Legs Syndrome Restless legs syndrome is a movement disorder. It may also be called a sensorimotor disorder.  CAUSES  No one knows what specifically causes restless legs syndrome, but it tends to run in families. It is also more common in people with low iron, in pregnancy, in people who need dialysis, and those with nerve damage (neuropathy).Some medications may make restless legs syndrome worse.Those medications include drugs to treat high blood pressure, some heart conditions, nausea, colds, allergies, and depression. SYMPTOMS Symptoms include uncomfortable sensations in the legs. These leg sensations are worse during periods of inactivity or rest. They are also worse while sitting or lying down. Individuals that have the disorder describe sensations in the legs that feel like:  Pulling.  Drawing.  Crawling.  Worming.  Boring.  Tingling.  Pins and needles.  Prickling.  Pain. The sensations are usually accompanied by an overwhelming urge to move the legs. Sudden muscle jerks may also occur. Movement provides temporary relief from the discomfort. In rare cases, the arms may also be affected. Symptoms may interfere with going to sleep (sleep onset insomnia). Restless legs syndrome may also be related to periodic limb movement disorder (PLMD). PLMD is another more common motor disorder. It also causes interrupted sleep. The symptoms from PLMD usually occur most often when you are awake. TREATMENT  Treatment for restless legs syndrome is symptomatic. This means that the symptoms are treated.   Massage and cold compresses may provide temporary relief.  Walk, stretch, or take a cold or hot bath.  Get regular exercise and a good night's sleep.  Avoid  caffeine, alcohol, nicotine, and medications that can make it worse.  Do activities that provide mental stimulation like discussions, needlework, and video games. These may be helpful if you are not able to walk or stretch. Some medications are effective in relieving the symptoms. However, many of these medications have side effects. Ask your caregiver about medications that may help your symptoms. Correcting iron deficiency may improve symptoms for some patients. Document Released: 11/28/2002 Document Revised: 04/24/2014 Document Reviewed: 03/06/2011 [facility] Patient Information 2015 [place], [place]. This information is not intended to replace advice given to you by your health care provider. Make sure you discuss any questions you have with your health care provider.

## 2015-06-20 NOTE — Progress Notes (Signed)
Pre-visit discussion using our clinic review tool. No additional management support is needed unless otherwise documented below in the visit note.  

## 2015-06-21 ENCOUNTER — Ambulatory Visit: Payer: Medicare Other | Admitting: Internal Medicine

## 2015-06-22 NOTE — Assessment & Plan Note (Addendum)
Several options discussed,  Including adding melatonin 5 mg in the early evening,  Reducing remeron to 15 mg and treating RLS.  Alprazolam taper discussed.

## 2015-06-27 ENCOUNTER — Other Ambulatory Visit: Payer: Self-pay | Admitting: Internal Medicine

## 2015-07-25 ENCOUNTER — Telehealth: Payer: Self-pay | Admitting: Internal Medicine

## 2015-07-25 NOTE — Telephone Encounter (Signed)
Please ask patient if she would like to address the GI issues  Her husband mentioned to me at the [facility] on Monday

## 2015-07-27 ENCOUNTER — Telehealth: Payer: Self-pay | Admitting: Internal Medicine

## 2015-07-27 NOTE — Telephone Encounter (Signed)
Patient scheduled.

## 2015-07-31 ENCOUNTER — Ambulatory Visit
Admission: RE | Admit: 2015-07-31 | Discharge: 2015-07-31 | Disposition: A | Discharge status: Home / Self Care | Payer: Medicare Other | Source: Ambulatory Visit | Attending: Internal Medicine | Admitting: Internal Medicine

## 2015-07-31 ENCOUNTER — Encounter: Payer: Self-pay | Admitting: Internal Medicine

## 2015-07-31 ENCOUNTER — Ambulatory Visit (INDEPENDENT_AMBULATORY_CARE_PROVIDER_SITE_OTHER): Payer: Medicare Other | Admitting: Internal Medicine

## 2015-07-31 VITALS — BP 104/70 | HR 82 | Temp 98.2°F | Resp 16 | Ht 65.0 in | Wt 128.5 lb

## 2015-07-31 DIAGNOSIS — R1030 Lower abdominal pain, unspecified: Secondary | ICD-10-CM

## 2015-07-31 DIAGNOSIS — N200 Calculus of kidney: Secondary | ICD-10-CM | POA: Diagnosis not present

## 2015-07-31 DIAGNOSIS — K5900 Constipation, unspecified: Secondary | ICD-10-CM | POA: Diagnosis not present

## 2015-07-31 DIAGNOSIS — K5909 Other constipation: Secondary | ICD-10-CM | POA: Insufficient documentation

## 2015-07-31 MED ORDER — PEG 3350-KCL-NABCB-NACL-NASULF 236 G PO SOLR: ORAL | Status: DC

## 2015-07-31 MED ORDER — LUBIPROSTONE 8 MCG PO CAPS
8.0000 ug | ORAL_CAPSULE | Freq: Two times a day (BID) | ORAL | Status: DC
Start: 2015-07-31 — End: 2016-01-30

## 2015-07-31 NOTE — Patient Instructions (Signed)
Plain abdominal films to evaluate your bowel gas pattern   If it confirms my suspicion of constipation ,  I will call in a bowel prep to take for a few doses .  Once you feel you have had an adequate elimination , STOP the prep  Start the Amitiza the following day twice daily.  You do not need to continue the miralax while you are on the amitiza  Lubiprostone oral capsule What is this medicine? LUBIPROSTONE (loo bi PROS tone) is a laxative. It is used to treat chronic constipation and constipation caused by opioids (certain prescription pain medicines). It is also used to treat adult women with irritable bowel syndrome who have constipation. This medicine may be used for other purposes; ask your health care provider or pharmacist if you have questions. COMMON BRAND NAME(S): Amitiza What should I tell my health care provider before I take this medicine? They need to know if you have any of these conditions: -cancer or tumor in abdomen, intestine, or stomach -history of bowel obstruction or adhesions -history of stool (fecal) impaction -liver disease -an unusual or allergic reaction to lubiprostone, other medicines, foods, dyes, or preservatives -pregnant or trying to get pregnant -breast-feeding How should I use this medicine? Take this medicine by mouth with a glass of water. Follow the directions on the prescription label. Do not cut, crush or chew this medicine. Take this medicine with food. Take your medicine at regular intervals. Do not take your medicine more often than directed. Do not stop taking except on your doctor's advice. Talk to your pediatrician regarding the use of this medicine in children. Special care may be needed. Overdosage: If you think you have taken too much of this medicine contact a poison control center or emergency room at once. NOTE: This medicine is only for you. Do not share this medicine with others. What if I miss a dose? If you miss a dose, take it as soon  as you can. If it is almost time for your next dose, take only that dose. Do not take double or extra doses. What may interact with this medicine? -medicines that treat diarrhea -methadone -other medicines for constipation This list may not describe all possible interactions. Give your health care provider a list of all the medicines, herbs, non-prescription drugs, or dietary supplements you use. Also tell them if you smoke, drink alcohol, or use illegal drugs. Some items may interact with your medicine. What should I watch for while using this medicine? Visit your doctor for regular check ups. Tell your doctor if your symptoms do not get better or if they get worse. What side effects may I notice from receiving this medicine? Side effects that you should report to your doctor or health care professional as soon as possible: -allergic reactions like skin rash, itching or hives, swelling of the face, lips, or tongue -new or worsening stomach pain -severe or prolonged diarrhea Side effects that usually do not require medical attention (report to your prescriber or health care professional if they continue or are bothersome): -headache -loose stools -nausea This list may not describe all possible side effects. Call your doctor for medical advice about side effects. You may report side effects to FDA at 1-800-FDA-1088. Where should I keep my medicine? Keep out of the reach of children. Store at room temperature between 15 and 30 degrees C (59 and 86 degrees F). Throw away any unused medicine after the expiration date. NOTE: This sheet is a summary. It  may not cover all possible information. If you have questions about this medicine, talk to your doctor, pharmacist, or health care provider.  2015, [employer]vier/Gold Standard. (2012-04-16 09:24:31)

## 2015-07-31 NOTE — Progress Notes (Signed)
Pre-visit discussion using our clinic review tool. No additional management support is needed unless otherwise documented below in the visit note.  

## 2015-07-31 NOTE — Progress Notes (Signed)
Subjective:  Patient ID: Veronica Ferrell, female    DOB: 06-Sep-1951  Age: 64 y.o. MRN: [id number]  CC: The primary encounter diagnosis was Lower abdominal pain. Diagnoses of Constipation, unspecified constipation type and Chronic constipation were also pertinent to this visit.  HPI Veronica Ferrell presents for recurrent episodes of increased stooling.  She reports frequent days of 5 to 8 bathroom trips to eliminate small caliber, normal caliber and variable caliber stools without bleeding. Some day she has 9 BMs daily  subjective chills one day last week,  Resolved spontaneously.    Denies unintentional weight loss or weight gain. Appetite good.  Up to date on colonoscopy.   History of diverticulosis with 'itis' in the Fall.     Takes 3/4 serving of miralax daily, most days .  Mixes Fiber one in Fage yogurt,    Has seen [facility] only once.  Outpatient Prescriptions Prior to Visit  Medication Sig Dispense Refill  . ALPRAZolam (XANAX) 1 MG tablet Take 1 tablet (1 mg total) by mouth at bedtime as needed for anxiety. 30 tablet 2  . aspirin 81 MG tablet Take 81 mg by mouth daily.    . calcium citrate (CALCITRATE - DOSED IN MG ELEMENTAL CALCIUM) 950 MG tablet Take 1 tablet by mouth daily.    . Cholecalciferol (VITAMIN D3) 1000 UNITS CAPS Take 1 capsule by mouth daily.    . fludrocortisone (FLORINEF) 0.1 MG tablet TAKE 1 TABLET EVERY DAY 90 tablet 1  . hydrocortisone (CORTEF) 10 MG tablet TAKE TWO TABLETS EVERY MORNING AND TAKE ONE TABLET EVERY EVENING 270 tablet 2  . mirtazapine (REMERON) 30 MG tablet TAKE ONE TABLET AT BEDTIME 90 tablet 0  . Multiple Vitamins-Minerals (MULTIVITAMIN WITH MINERALS) tablet Take 1 tablet by mouth daily.    . polyethylene glycol powder (GLYCOLAX/MIRALAX) powder Take 17 g by mouth daily.    Marland Kitchen PREMARIN 0.3 MG tablet TAKE 1 TABLET EVERY DAY 90 tablet 3  . triamcinolone cream (KENALOG) 0.1 % Apply 1 application topically 2 (two) times daily. 454 g 0  . vitamin C  (ASCORBIC ACID) 500 MG tablet Take 500 mg by mouth daily.     No facility-administered medications prior to visit.    Review of Systems;  Patient denies headache, fevers, malaise, unintentional weight loss, skin rash, eye pain, sinus congestion and sinus pain, sore throat, dysphagia,  hemoptysis , cough, dyspnea, wheezing, chest pain, palpitations, orthopnea, edema, abdominal pain, nausea, melena, diarrhea, constipation, flank pain, dysuria, hematuria, urinary  Frequency, nocturia, numbness, tingling, seizures,  Focal weakness, Loss of consciousness,  Tremor, insomnia, depression, anxiety, and suicidal ideation.      Objective:  BP 104/70 mmHg  Pulse 82  Temp(Src) 98.2 F (36.8 C) (Oral)  Resp 16  Ht 5\' 5"  (1.651 m)  Wt 128 lb 8 oz (58.287 kg)  BMI 21.38 kg/m2  SpO2 96%  BP Readings from Last 3 Encounters:  07/31/15 104/70  06/20/15 100/60  03/15/15 98/60    Wt Readings from Last 3 Encounters:  07/31/15 128 lb 8 oz (58.287 kg)  06/20/15 123 lb 8 oz (56.019 kg)  03/15/15 122 lb 12 oz (55.679 kg)    General appearance: alert, cooperative and appears stated age Ears: normal TM's and external ear canals both ears Throat: lips, mucosa, and tongue normal; teeth and gums normal Neck: no adenopathy, no carotid bruit, supple, symmetrical, trachea midline and thyroid not enlarged, symmetric, no tenderness/mass/nodules Back: symmetric, no curvature. ROM normal. No CVA tenderness. Lungs: clear  to auscultation bilaterally Heart: regular rate and rhythm, S1, S2 normal, no murmur, click, rub or gallop Abdomen: soft, non-tender; bowel sounds normal; no masses,  no organomegaly Pulses: 2+ and symmetric Skin: Skin color, texture, turgor normal. No rashes or lesions Lymph nodes: Cervical, supraclavicular, and axillary nodes normal.  No results found for: HGBA1C  Lab Results  Component Value Date   CREATININE 0.84 03/09/2015   CREATININE 0.9 03/07/2014   CREATININE 0.9 03/02/2014     Lab Results  Component Value Date   WBC 9.1 03/15/2015   HGB 14.3 03/15/2015   HCT 42.5 03/15/2015   PLT 333.0 03/15/2015   GLUCOSE 88 03/09/2015   CHOL 192 03/09/2015   TRIG 77.0 03/09/2015   HDL 98.60 03/09/2015   LDLCALC 78 03/09/2015   ALT 12 03/09/2015   AST 17 03/09/2015   NA 133* 03/09/2015   K 4.3 03/09/2015   CL 99 03/09/2015   CREATININE 0.84 03/09/2015   BUN 24* 03/09/2015   CO2 30 03/09/2015   TSH 4.24 03/09/2015    No results found.  Assessment & Plan:   Problem List Items Addressed This Visit      Unprioritized   Chronic constipation    Confirmed with plain films done today which showed significant fecal material. Advised to drink Bowel prep, followed by amitiza trial.       Relevant Medications   polyethylene glycol (GOLYTELY/NULYTELY) 236 G solution    Other Visit Diagnoses    Lower abdominal pain    -  Primary    Relevant Orders    DG Abd 2 Views (Completed)    Constipation, unspecified constipation type        Relevant Orders    DG Abd 2 Views (Completed)      A total of 25 minutes of face to face time was spent with patient more than half of which was spent in counselling and coordination of care   I am having Veronica Ferrell start on lubiprostone and polyethylene glycol. I am also having her maintain her polyethylene glycol powder, aspirin, Vitamin D3, calcium citrate, multivitamin with minerals, vitamin C, ALPRAZolam, triamcinolone cream, PREMARIN, hydrocortisone, fludrocortisone, and mirtazapine.  Meds ordered this encounter  Medications  . lubiprostone (AMITIZA) 8 MCG capsule    Sig: Take 1 capsule (8 mcg total) by mouth 2 (two) times daily with a meal.    Dispense:  60 capsule    Refill:  1  . polyethylene glycol (GOLYTELY/NULYTELY) 236 G solution    Sig: Drink 8 ounces every 30 minutes until constipation is relieved    Dispense:  4000 mL    Refill:  0    There are no discontinued medications.  Follow-up: No Follow-up on  file.   Crecencio Mc, MD

## 2015-08-01 NOTE — Assessment & Plan Note (Signed)
Confirmed with plain films done today which showed significant fecal material. Advised to drink Bowel prep, followed by amitiza trial.

## 2015-08-28 ENCOUNTER — Other Ambulatory Visit: Payer: Self-pay | Admitting: Internal Medicine

## 2015-08-28 ENCOUNTER — Encounter: Payer: Self-pay | Admitting: Internal Medicine

## 2015-08-28 DIAGNOSIS — K5901 Slow transit constipation: Secondary | ICD-10-CM

## 2015-08-28 MED ORDER — LACTULOSE 20 GM/30ML PO SOLN: ORAL | Status: DC

## 2015-09-06 ENCOUNTER — Encounter: Payer: Self-pay | Admitting: Gastroenterology

## 2015-09-06 ENCOUNTER — Ambulatory Visit (INDEPENDENT_AMBULATORY_CARE_PROVIDER_SITE_OTHER): Payer: Medicare Other | Admitting: Gastroenterology

## 2015-09-06 VITALS — BP 101/65 | HR 98 | Temp 98.3°F | Ht 64.0 in | Wt 126.0 lb

## 2015-09-06 DIAGNOSIS — K5901 Slow transit constipation: Secondary | ICD-10-CM

## 2015-09-06 NOTE — Progress Notes (Signed)
Primary Care Physician: Crecencio Mc, MD  Primary Gastroenterologist:  Dr. Lucilla Lame  Chief Complaint  Patient presents with  . chronic constipation    HPI: Veronica Ferrell is a 65 y.o. female here for chronic constipation. The patient states she was started on lactulose and this gave her profuse diarrhea. The patient also reports that she has been increasing fiber in her diet but has suffered with hard stools in the morning then multiple softer stools throughout the day. The patient then states that she will not move her bowels for a few days.  Current Outpatient Prescriptions  Medication Sig Dispense Refill  . ALPRAZolam (XANAX) 1 MG tablet Take 1 tablet (1 mg total) by mouth at bedtime as needed for anxiety. 30 tablet 2  . calcium citrate (CALCITRATE - DOSED IN MG ELEMENTAL CALCIUM) 950 MG tablet Take 1 tablet by mouth daily.    . Cholecalciferol (VITAMIN D3) 1000 UNITS CAPS Take 1 capsule by mouth daily.    . fludrocortisone (FLORINEF) 0.1 MG tablet TAKE 1 TABLET EVERY DAY 90 tablet 1  . hydrocortisone (CORTEF) 10 MG tablet TAKE TWO TABLETS EVERY MORNING AND TAKE ONE TABLET EVERY EVENING 270 tablet 2  . mirtazapine (REMERON) 30 MG tablet TAKE ONE TABLET AT BEDTIME 90 tablet 0  . Multiple Vitamins-Minerals (MULTIVITAMIN WITH MINERALS) tablet Take 1 tablet by mouth daily.    . polyethylene glycol (GOLYTELY/NULYTELY) 236 G solution Drink 8 ounces every 30 minutes until constipation is relieved 4000 mL 0  . polyethylene glycol powder (GLYCOLAX/MIRALAX) powder Take 17 g by mouth daily.    Marland Kitchen PREMARIN 0.3 MG tablet TAKE 1 TABLET EVERY DAY 90 tablet 3  . triamcinolone cream (KENALOG) 0.1 % Apply 1 application topically 2 (two) times daily. 454 g 0  . vitamin C (ASCORBIC ACID) 500 MG tablet Take 500 mg by mouth daily.    Marland Kitchen aspirin 81 MG tablet Take 81 mg by mouth once a week.     . Lactulose 20 GM/30ML SOLN 30 ml every 4 hours until constipation is relieved (Patient not taking: Reported  on 09/06/2015) 236 mL 3  . lubiprostone (AMITIZA) 8 MCG capsule Take 1 capsule (8 mcg total) by mouth 2 (two) times daily with a meal. (Patient not taking: Reported on 09/06/2015) 60 capsule 1   No current facility-administered medications for this visit.    Allergies as of 09/06/2015 - Review Complete 09/06/2015  Allergen Reaction Noted  . Alendronate sodium Nausea Only 09/06/2015  . Boniva [ibandronic acid] Nausea And Vomiting 07/06/2013  . Clindamycin/lincomycin Dermatitis 07/06/2013  . Penicillins Rash 07/06/2013    ROS:  General: Negative for anorexia, weight loss, fever, chills, fatigue, weakness. ENT: Negative for hoarseness, difficulty swallowing , nasal congestion. CV: Negative for chest pain, angina, palpitations, dyspnea on exertion, peripheral edema.  Respiratory: Negative for dyspnea at rest, dyspnea on exertion, cough, sputum, wheezing.  GI: See history of present illness. GU:  Negative for dysuria, hematuria, urinary incontinence, urinary frequency, nocturnal urination.  Endo: Negative for unusual weight change.    Physical Examination:   BP 101/65 mmHg  Pulse 98  Temp(Src) 98.3 F (36.8 C) (Oral)  Ht 5\' 4"  (1.626 m)  Wt 126 lb (57.153 kg)  BMI 21.62 kg/m2  General: Well-nourished, well-developed in no acute distress.  Eyes: No icterus. Conjunctivae pink. Mouth: Oropharyngeal mucosa moist and pink , no lesions erythema or exudate. Lungs: Clear to auscultation bilaterally. Non-labored. Heart: Regular rate and rhythm, no murmurs rubs or gallops.  Abdomen: Bowel sounds are normal, nontender, nondistended, no hepatosplenomegaly or masses, no abdominal bruits or hernia , no rebound or guarding.   Extremities: No lower extremity edema. No clubbing or deformities. Neuro: Alert and oriented x 3.  Grossly intact. Skin: Warm and dry, no jaundice.   Psych: Alert and cooperative, normal mood and affect.  Labs:    Imaging Studies: No results found.  Assessment and  Plan:   Veronica Ferrell is a 64 y.o. y/o female who comes in with a history of chronic constipation although when she takes the laxatives she starts to have loose stools. The patient has been told to take Citrucel twice a day and MiraLAX once a day. If the stools are too soft she has been told to cut back on the MiraLAX and if the stools do not get more frequent than she should add a lower dose of the lactulose. The patient will try this and contact me if she does not improve.   Note: This dictation was prepared with Dragon dictation along with smaller phrase technology. Any transcriptional errors that result from this process are unintentional.

## 2015-09-12 ENCOUNTER — Ambulatory Visit: Payer: Self-pay | Admitting: Gastroenterology

## 2015-09-24 ENCOUNTER — Other Ambulatory Visit: Payer: Self-pay | Admitting: Internal Medicine

## 2015-09-26 ENCOUNTER — Other Ambulatory Visit: Payer: Self-pay | Admitting: *Deleted

## 2015-09-26 NOTE — Telephone Encounter (Signed)
Okay to refill? Please advise.  

## 2015-09-27 MED ORDER — PEG 3350-KCL-NABCB-NACL-NASULF 236 G PO SOLR: ORAL | Status: DC

## 2015-09-27 NOTE — Telephone Encounter (Signed)
Ok to refill,  Refill sent  

## 2015-10-01 ENCOUNTER — Encounter: Payer: Self-pay | Admitting: Internal Medicine

## 2015-10-01 ENCOUNTER — Other Ambulatory Visit: Payer: Self-pay

## 2015-10-01 NOTE — Telephone Encounter (Signed)
Pleas advise.

## 2015-10-02 MED ORDER — ALPRAZOLAM 1 MG PO TABS: 1.0000 mg | ORAL_TABLET | Freq: Every evening | ORAL | Status: DC | PRN

## 2015-10-02 NOTE — Telephone Encounter (Signed)
Ok to refill,  printed rx  

## 2015-10-03 ENCOUNTER — Other Ambulatory Visit: Payer: Self-pay | Admitting: *Deleted

## 2015-10-03 NOTE — Telephone Encounter (Signed)
Okay to refill? Last fiiled on 09/27/15. Please advise

## 2015-10-03 NOTE — Telephone Encounter (Signed)
Faxed to pharmacy

## 2015-10-04 NOTE — Telephone Encounter (Signed)
Please call patient and find out if she really meant to request a refill  On the '"go lytely" since it was refilled on Oct 6 , because it is too soon to use it again.  Find out if she tried Dr Dorothey Baseman suggestion of Citrucel and Miralax  And whether she has follow up with him .

## 2015-10-05 NOTE — Telephone Encounter (Signed)
Left message to call office

## 2015-10-08 NOTE — Telephone Encounter (Signed)
Spoke with the pharmacy, patient ment to order regular miralax.  Changed and discontinued this order.

## 2015-11-26 ENCOUNTER — Encounter: Payer: Self-pay | Admitting: Podiatry

## 2015-11-26 ENCOUNTER — Ambulatory Visit (INDEPENDENT_AMBULATORY_CARE_PROVIDER_SITE_OTHER): Payer: Medicare Other | Admitting: Podiatry

## 2015-11-26 VITALS — BP 106/61 | HR 92 | Resp 16

## 2015-11-26 DIAGNOSIS — L603 Nail dystrophy: Secondary | ICD-10-CM

## 2015-11-26 NOTE — Progress Notes (Signed)
She presents today with chief complaint of an oblique toenail hallux right. She states that is has grown back this way since we removed the margins. She states that it is simply unattractive and can be painful at times. States that she is unable to take oral antifungals and even some of the paint on because of her Addison's disease.  Objective: Vital signs are stable she is alert and oriented 3. Pulses are strongly palpable. Hallux nail plate right does demonstrates a nail dystrophy with onychocryptosis and possible onychomycosis. No other major osseous abnormalities.  Assessment: Nail dystrophy hallux right.  Plan: Samples of the nail were taken today to be sent for pathologic evaluation.

## 2015-11-27 ENCOUNTER — Other Ambulatory Visit: Payer: Self-pay | Admitting: Internal Medicine

## 2015-11-27 DIAGNOSIS — Z1231 Encounter for screening mammogram for malignant neoplasm of breast: Secondary | ICD-10-CM

## 2015-12-04 ENCOUNTER — Encounter: Payer: Self-pay | Admitting: Internal Medicine

## 2015-12-19 ENCOUNTER — Encounter: Payer: Self-pay | Admitting: Internal Medicine

## 2015-12-25 ENCOUNTER — Telehealth: Payer: Self-pay | Admitting: Internal Medicine

## 2015-12-25 NOTE — Telephone Encounter (Signed)
Spoke with [name], Answered questions.  Approval/Denial will be faxed in 3-5 business days to the office.

## 2015-12-25 NOTE — Telephone Encounter (Signed)
Veronica Ferrell [id number] called from [place] [facility] regarding a medication PREMARIN 0.3 MG tablet. He wants to know if the pt has tried the alternative medication? Time sensitive due today. Thank You!

## 2015-12-25 NOTE — Telephone Encounter (Signed)
Attempted to call [name] back, left detailed message to call me back.

## 2015-12-31 ENCOUNTER — Encounter: Payer: Self-pay | Admitting: Internal Medicine

## 2016-01-09 ENCOUNTER — Ambulatory Visit (INDEPENDENT_AMBULATORY_CARE_PROVIDER_SITE_OTHER): Payer: Medicare Other | Admitting: Podiatry

## 2016-01-09 ENCOUNTER — Other Ambulatory Visit: Payer: Self-pay | Admitting: Internal Medicine

## 2016-01-09 ENCOUNTER — Encounter: Payer: Self-pay | Admitting: Podiatry

## 2016-01-09 ENCOUNTER — Encounter: Payer: Self-pay | Admitting: Internal Medicine

## 2016-01-09 ENCOUNTER — Ambulatory Visit
Admission: RE | Admit: 2016-01-09 | Discharge: 2016-01-09 | Disposition: A | Discharge status: Home / Self Care | Payer: Medicare Other | Source: Ambulatory Visit | Attending: Internal Medicine | Admitting: Internal Medicine

## 2016-01-09 VITALS — BP 104/72 | HR 99 | Resp 18

## 2016-01-09 DIAGNOSIS — Z1231 Encounter for screening mammogram for malignant neoplasm of breast: Secondary | ICD-10-CM | POA: Diagnosis present

## 2016-01-09 DIAGNOSIS — L603 Nail dystrophy: Secondary | ICD-10-CM

## 2016-01-09 NOTE — Progress Notes (Signed)
She presents today for follow-up of her nail dystrophy hallux right. She is here for the results of her pathology report.  Objective: Vital signs are stable she is alert and oriented 3. Pulses are palpable bilateral. No tinea pedis and no change to the nail dystrophy hallux right. Pathology report does demonstrate nail dystrophy as well as dermatophyte infection.  Assessment: Onychodystrophy with onychomycosis.  Plan: We discussed in great detail today laser therapy for 6 months with debridement each month. I also discussed routine debridement at home. She is not a candidate for the oral medication due to her Addison's disease. She does not want to utilize the topicals. At this point she does not want to make a decision in haste and we'll discuss this with her husband and follow up with me if need be.

## 2016-01-16 ENCOUNTER — Other Ambulatory Visit: Payer: Self-pay | Admitting: Internal Medicine

## 2016-01-30 ENCOUNTER — Telehealth: Payer: Self-pay

## 2016-01-30 ENCOUNTER — Ambulatory Visit
Admission: RE | Admit: 2016-01-30 | Discharge: 2016-01-30 | Disposition: A | Discharge status: Home / Self Care | Payer: No Typology Code available for payment source | Source: Ambulatory Visit | Attending: Internal Medicine | Admitting: Internal Medicine

## 2016-01-30 ENCOUNTER — Ambulatory Visit (INDEPENDENT_AMBULATORY_CARE_PROVIDER_SITE_OTHER): Payer: Medicare Other | Admitting: Internal Medicine

## 2016-01-30 ENCOUNTER — Other Ambulatory Visit: Payer: Self-pay | Admitting: Internal Medicine

## 2016-01-30 VITALS — BP 102/70 | HR 80 | Temp 97.5°F | Resp 12 | Ht 64.0 in | Wt 126.5 lb

## 2016-01-30 DIAGNOSIS — R51 Headache: Principal | ICD-10-CM

## 2016-01-30 DIAGNOSIS — S134XXA Sprain of ligaments of cervical spine, initial encounter: Secondary | ICD-10-CM

## 2016-01-30 DIAGNOSIS — R937 Abnormal findings on diagnostic imaging of other parts of musculoskeletal system: Secondary | ICD-10-CM | POA: Diagnosis not present

## 2016-01-30 DIAGNOSIS — M47812 Spondylosis without myelopathy or radiculopathy, cervical region: Secondary | ICD-10-CM | POA: Diagnosis not present

## 2016-01-30 DIAGNOSIS — R519 Headache, unspecified: Secondary | ICD-10-CM

## 2016-01-30 MED ORDER — TIZANIDINE HCL 2 MG PO CAPS: 2.0000 mg | ORAL_CAPSULE | Freq: Three times a day (TID) | ORAL | Status: DC | PRN

## 2016-01-30 MED ORDER — CELECOXIB 200 MG PO CAPS: 200.0000 mg | ORAL_CAPSULE | Freq: Two times a day (BID) | ORAL | Status: DC

## 2016-01-30 MED ORDER — CYCLOBENZAPRINE HCL 5 MG PO TABS: 5.0000 mg | ORAL_TABLET | Freq: Three times a day (TID) | ORAL | Status: DC | PRN

## 2016-01-30 NOTE — Telephone Encounter (Signed)
Dr. Enriqueta Shutter called from Imaging called regarding Patient, Veronica Ferrell that was completed.  Dr. Enriqueta Shutter would like you to call him at [phone], an assistant will answer but will get him on the phone.  Patient's xray shows a possible fracture.
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
## 2016-01-30 NOTE — Assessment & Plan Note (Addendum)
I am sending her for  C spine films to rule out a hairline fracture and evaluate alignment given history of osteopenia .  Supportive care with celebrex 200 mg bid,  Flexeril 5 mg tid prn,  And soft cervical collar advised

## 2016-01-30 NOTE — Progress Notes (Signed)
Pre-visit discussion using our clinic review tool. No additional management support is needed unless otherwise documented below in the visit note.  
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
## 2016-01-30 NOTE — Telephone Encounter (Signed)
patient notified and released.

## 2016-01-30 NOTE — Telephone Encounter (Signed)
No acute osseous injury of the cervical spine, spondylosis as described above linear lucency through the right C3 facet with sclerotic margins with may reflect incomplete developmental fusion versus old fracture. Please advise, thanks

## 2016-02-01 ENCOUNTER — Telehealth: Payer: Self-pay | Admitting: Internal Medicine

## 2016-02-01 DIAGNOSIS — Z7289 Other problems related to lifestyle: Secondary | ICD-10-CM

## 2016-02-01 DIAGNOSIS — R5383 Other fatigue: Secondary | ICD-10-CM

## 2016-02-01 DIAGNOSIS — E559 Vitamin D deficiency, unspecified: Secondary | ICD-10-CM

## 2016-02-01 DIAGNOSIS — Z79899 Other long term (current) drug therapy: Secondary | ICD-10-CM

## 2016-02-01 NOTE — Telephone Encounter (Signed)
Pt called wanting to get lab work before physical appt. Need order please and thank you! Call pt @ [phone].

## 2016-02-01 NOTE — Telephone Encounter (Signed)
Lab have been scheduled

## 2016-02-01 NOTE — Telephone Encounter (Signed)
Fasting labs ordered

## 2016-02-01 NOTE — Telephone Encounter (Signed)
Pt would like to get labs drawn before her physical. Please advise, thanks

## 2016-03-07 ENCOUNTER — Other Ambulatory Visit (INDEPENDENT_AMBULATORY_CARE_PROVIDER_SITE_OTHER): Payer: Medicare Other

## 2016-03-07 DIAGNOSIS — E559 Vitamin D deficiency, unspecified: Secondary | ICD-10-CM

## 2016-03-07 DIAGNOSIS — Z79899 Other long term (current) drug therapy: Secondary | ICD-10-CM

## 2016-03-07 DIAGNOSIS — Z7289 Other problems related to lifestyle: Secondary | ICD-10-CM

## 2016-03-07 DIAGNOSIS — R5383 Other fatigue: Secondary | ICD-10-CM

## 2016-03-07 LAB — COMPREHENSIVE METABOLIC PANEL
ALBUMIN: 4.2 g/dL (ref 3.5–5.2)
ALK PHOS: 29 U/L — AB (ref 39–117)
ALT: 12 U/L (ref 0–35)
AST: 16 U/L (ref 0–37)
BILIRUBIN TOTAL: 0.4 mg/dL (ref 0.2–1.2)
BUN: 22 mg/dL (ref 6–23)
CALCIUM: 9.2 mg/dL (ref 8.4–10.5)
CO2: 27 mEq/L (ref 19–32)
Chloride: 99 mEq/L (ref 96–112)
Creatinine, Ser: 0.84 mg/dL (ref 0.40–1.20)
GFR: 71.9 mL/min (ref >60.00)
Glucose, Bld: 92 mg/dL (ref 70–99)
Potassium: 4.8 mEq/L (ref 3.5–5.1)
Sodium: 134 mEq/L — ABNORMAL LOW (ref 135–145)
TOTAL PROTEIN: 6.9 g/dL (ref 6.0–8.3)

## 2016-03-07 LAB — LIPID PANEL
CHOLESTEROL: 203 mg/dL — AB (ref 0–200)
HDL: 82.9 mg/dL (ref >39.00)
LDL CALC: 102 mg/dL — AB (ref 0–99)
NonHDL: 120.39
TRIGLYCERIDES: 91 mg/dL (ref 0.0–149.0)
Total CHOL/HDL Ratio: 2
VLDL: 18.2 mg/dL (ref 0.0–40.0)

## 2016-03-07 LAB — CBC WITH DIFFERENTIAL/PLATELET
BASOS PCT: 0.3 % (ref 0.0–3.0)
Basophils Absolute: 0 10*3/uL (ref 0.0–0.1)
EOS ABS: 0.1 10*3/uL (ref 0.0–0.7)
Eosinophils Relative: 1.3 % (ref 0.0–5.0)
HCT: 40.9 % (ref 36.0–46.0)
HEMOGLOBIN: 13.7 g/dL (ref 12.0–15.0)
Lymphocytes Relative: 31.6 % (ref 12.0–46.0)
Lymphs Abs: 3.5 10*3/uL (ref 0.7–4.0)
MCHC: 33.4 g/dL (ref 30.0–36.0)
MCV: 96 fl (ref 78.0–100.0)
MONO ABS: 0.7 10*3/uL (ref 0.1–1.0)
Monocytes Relative: 6 % (ref 3.0–12.0)
Neutro Abs: 6.7 10*3/uL (ref 1.4–7.7)
Neutrophils Relative %: 60.8 % (ref 43.0–77.0)
Platelets: 368 10*3/uL (ref 150.0–400.0)
RBC: 4.27 Mil/uL (ref 3.87–5.11)
RDW: 13.2 % (ref 11.5–15.5)
WBC: 10.9 10*3/uL — AB (ref 4.0–10.5)

## 2016-03-07 LAB — VITAMIN D 25 HYDROXY (VIT D DEFICIENCY, FRACTURES): VITD: 56.11 ng/mL (ref 30.00–100.00)

## 2016-03-07 LAB — TSH: TSH: 2.6 u[IU]/mL (ref 0.35–4.50)

## 2016-03-08 ENCOUNTER — Encounter: Payer: Self-pay | Admitting: Internal Medicine

## 2016-03-08 LAB — HEPATITIS C ANTIBODY: HCV Ab: NEGATIVE

## 2016-03-18 ENCOUNTER — Ambulatory Visit (INDEPENDENT_AMBULATORY_CARE_PROVIDER_SITE_OTHER): Payer: Medicare Other | Admitting: Internal Medicine

## 2016-03-18 ENCOUNTER — Encounter: Payer: Self-pay | Admitting: Internal Medicine

## 2016-03-18 VITALS — BP 102/60 | HR 87 | Temp 98.1°F | Resp 12 | Ht 64.0 in | Wt 123.5 lb

## 2016-03-18 DIAGNOSIS — N6310 Unspecified lump in the right breast, unspecified quadrant: Secondary | ICD-10-CM

## 2016-03-18 DIAGNOSIS — Z0001 Encounter for general adult medical examination with abnormal findings: Secondary | ICD-10-CM

## 2016-03-18 DIAGNOSIS — N6459 Other signs and symptoms in breast: Secondary | ICD-10-CM

## 2016-03-18 DIAGNOSIS — N951 Menopausal and female climacteric states: Secondary | ICD-10-CM

## 2016-03-18 DIAGNOSIS — N63 Unspecified lump in breast: Secondary | ICD-10-CM | POA: Diagnosis not present

## 2016-03-18 DIAGNOSIS — Z0000 Encounter for general adult medical examination without abnormal findings: Secondary | ICD-10-CM

## 2016-03-18 MED ORDER — IPRATROPIUM BROMIDE 0.06 % NA SOLN: 2.0000 | Freq: Four times a day (QID) | NASAL | Status: DC

## 2016-03-18 MED ORDER — BENZONATATE 100 MG PO CAPS: 100.0000 mg | ORAL_CAPSULE | Freq: Two times a day (BID) | ORAL | Status: DC | PRN

## 2016-03-18 NOTE — Patient Instructions (Signed)
We discussed a trial of Atrovent nasal spray for your nighttime cough and I will also prescribe tessalon perles to suppress the cough reflex   Menopause is a normal process in which your reproductive ability comes to an end. This process happens gradually over a span of months to years, usually between the ages of 20 and 43. Menopause is complete when you have missed 12 consecutive menstrual periods. It is important to talk with your health care provider about some of the most common conditions that affect postmenopausal women, such as heart disease, cancer, and bone loss (osteoporosis). Adopting a healthy lifestyle and getting preventive care can help to promote your health and wellness. Those actions can also lower your chances of developing some of these common conditions. WHAT SHOULD I KNOW ABOUT MENOPAUSE? During menopause, you may experience a number of symptoms, such as:  Moderate-to-severe hot flashes.  Night sweats.  Decrease in sex drive.  Mood swings.  Headaches.  Tiredness.  Irritability.  Memory problems.  Insomnia. Choosing to treat or not to treat menopausal changes is an individual decision that you make with your health care provider. WHAT SHOULD I KNOW ABOUT HORMONE REPLACEMENT THERAPY AND SUPPLEMENTS? Hormone therapy products are effective for treating symptoms that are associated with menopause, such as hot flashes and night sweats. Hormone replacement carries certain risks, especially as you become older. If you are thinking about using estrogen or estrogen with progestin treatments, discuss the benefits and risks with your health care provider. WHAT SHOULD I KNOW ABOUT HEART DISEASE AND STROKE? Heart disease, heart attack, and stroke become more likely as you age. This may be due, in part, to the hormonal changes that your body experiences during menopause. These can affect how your body processes dietary fats, triglycerides, and cholesterol. Heart attack and stroke  are both medical emergencies. There are many things that you can do to help prevent heart disease and stroke:  Have your blood pressure checked at least every 1-2 years. High blood pressure causes heart disease and increases the risk of stroke.  If you are 35-5 years old, ask your health care provider if you should take aspirin to prevent a heart attack or a stroke.  Do not use any tobacco products, including cigarettes, chewing tobacco, or electronic cigarettes. If you need help quitting, ask your health care provider.  It is important to eat a healthy diet and maintain a healthy weight.  Be sure to include plenty of vegetables, fruits, low-fat dairy products, and lean protein.  Avoid eating foods that are high in solid fats, added sugars, or salt (sodium).  Get regular exercise. This is one of the most important things that you can do for your health.  Try to exercise for at least 150 minutes each week. The type of exercise that you do should increase your heart rate and make you sweat. This is known as moderate-intensity exercise.  Try to do strengthening exercises at least twice each week. Do these in addition to the moderate-intensity exercise.  Know your numbers.Ask your health care provider to check your cholesterol and your blood glucose. Continue to have your blood tested as directed by your health care provider. WHAT SHOULD I KNOW ABOUT CANCER SCREENING? There are several types of cancer. Take the following steps to reduce your risk and to catch any cancer development as early as possible. Breast Cancer  Practice breast self-awareness.  This means understanding how your breasts normally appear and feel.  It also means doing regular breast  self-exams. Let your health care provider know about any changes, no matter how small.  If you are 66 or older, have a clinician do a breast exam (clinical breast exam or CBE) every year. Depending on your age, family history, and medical  history, it may be recommended that you also have a yearly breast X-ray (mammogram).  If you have a family history of breast cancer, talk with your health care provider about genetic screening.  If you are at high risk for breast cancer, talk with your health care provider about having an MRI and a mammogram every year.  Breast cancer (BRCA) gene test is recommended for women who have family members with BRCA-related cancers. Results of the assessment will determine the need for genetic counseling and BRCA1 and for BRCA2 testing. BRCA-related cancers include these types:  Breast. This occurs in males or females.  Ovarian.  Tubal. This may also be called fallopian tube cancer.  Cancer of the abdominal or pelvic lining (peritoneal cancer).  Prostate.  Pancreatic. Cervical, Uterine, and Ovarian Cancer Your health care provider may recommend that you be screened regularly for cancer of the pelvic organs. These include your ovaries, uterus, and vagina. This screening involves a pelvic exam, which includes checking for microscopic changes to the surface of your cervix (Pap test).  For women ages 21-65, health care providers may recommend a pelvic exam and a Pap test every three years. For women ages 46-65, they may recommend the Pap test and pelvic exam, combined with testing for human papilloma virus (HPV), every five years. Some types of HPV increase your risk of cervical cancer. Testing for HPV may also be done on women of any age who have unclear Pap test results.  Other health care providers may not recommend any screening for nonpregnant women who are considered low risk for pelvic cancer and have no symptoms. Ask your health care provider if a screening pelvic exam is right for you.  If you have had past treatment for cervical cancer or a condition that could lead to cancer, you need Pap tests and screening for cancer for at least 20 years after your treatment. If Pap tests have been  discontinued for you, your risk factors (such as having a new sexual partner) need to be reassessed to determine if you should start having screenings again. Some women have medical problems that increase the chance of getting cervical cancer. In these cases, your health care provider may recommend that you have screening and Pap tests more often.  If you have a family history of uterine cancer or ovarian cancer, talk with your health care provider about genetic screening.  If you have vaginal bleeding after reaching menopause, tell your health care provider.  There are currently no reliable tests available to screen for ovarian cancer. Lung Cancer Lung cancer screening is recommended for adults 76-44 years old who are at high risk for lung cancer because of a history of smoking. A yearly low-dose CT scan of the lungs is recommended if you:  Currently smoke.  Have a history of at least 30 pack-years of smoking and you currently smoke or have quit within the past 15 years. A pack-year is smoking an average of one pack of cigarettes per day for one year. Yearly screening should:  Continue until it has been 15 years since you quit.  Stop if you develop a health problem that would prevent you from having lung cancer treatment. Colorectal Cancer  This type of cancer can  be detected and can often be prevented.  Routine colorectal cancer screening usually begins at age 16 and continues through age 56.  If you have risk factors for colon cancer, your health care provider may recommend that you be screened at an earlier age.  If you have a family history of colorectal cancer, talk with your health care provider about genetic screening.  Your health care provider may also recommend using home test kits to check for hidden blood in your stool.  A small camera at the end of a tube can be used to examine your colon directly (sigmoidoscopy or colonoscopy). This is done to check for the earliest forms  of colorectal cancer.  Direct examination of the colon should be repeated every 5-10 years until age 41. However, if early forms of precancerous polyps or small growths are found or if you have a family history or genetic risk for colorectal cancer, you may need to be screened more often. Skin Cancer  Check your skin from head to toe regularly.  Monitor any moles. Be sure to tell your health care provider:  About any new moles or changes in moles, especially if there is a change in a mole's shape or color.  If you have a mole that is larger than the size of a pencil eraser.  If any of your family members has a history of skin cancer, especially at a young age, talk with your health care provider about genetic screening.  Always use sunscreen. Apply sunscreen liberally and repeatedly throughout the day.  Whenever you are outside, protect yourself by wearing long sleeves, pants, a wide-brimmed hat, and sunglasses. WHAT SHOULD I KNOW ABOUT OSTEOPOROSIS? Osteoporosis is a condition in which bone destruction happens more quickly than new bone creation. After menopause, you may be at an increased risk for osteoporosis. To help prevent osteoporosis or the bone fractures that can happen because of osteoporosis, the following is recommended:  If you are 33-80 years old, get at least 1,000 mg of calcium and at least 600 mg of vitamin D per day.  If you are older than age 37 but younger than age 87, get at least 1,200 mg of calcium and at least 600 mg of vitamin D per day.  If you are older than age 58, get at least 1,200 mg of calcium and at least 800 mg of vitamin D per day. Smoking and excessive alcohol intake increase the risk of osteoporosis. Eat foods that are rich in calcium and vitamin D, and do weight-bearing exercises several times each week as directed by your health care provider. WHAT SHOULD I KNOW ABOUT HOW MENOPAUSE AFFECTS [place]? Depression may occur at any age, but it is  more common as you become older. Common symptoms of depression include:  Low or sad mood.  Changes in sleep patterns.  Changes in appetite or eating patterns.  Feeling an overall lack of motivation or enjoyment of activities that you previously enjoyed.  Frequent crying spells. Talk with your health care provider if you think that you are experiencing depression. WHAT SHOULD I KNOW ABOUT IMMUNIZATIONS? It is important that you get and maintain your immunizations. These include:  Tetanus, diphtheria, and pertussis (Tdap) booster vaccine.  Influenza every year before the flu season begins.  Pneumonia vaccine.  Shingles vaccine. Your health care provider may also recommend other immunizations.   This information is not intended to replace advice given to you by your health care provider. Make sure you discuss  any questions you have with your health care provider.   Document Released: 01/30/2006 Document Revised: 12/29/2014 Document Reviewed: 08/10/2014 Elsevier Interactive Patient Education [employer].

## 2016-03-18 NOTE — Progress Notes (Signed)
Patient ID: Veronica Ferrell, female    DOB: January 30, 1951  Age: 65 y.o. MRN: [id number]  The patient is here for annual  wellness examination and management of other chronic and acute problems.    Sleeping better using a meditation subscription called headspace (app on her i phone   Has a 10 day free trial)   Has tried using premarin every other day since January and hot flashes are worse and intolerable.   .  The risk factors are reflected in the social history.  The roster of all physicians providing medical care to patient - is listed in the Snapshot section of the chart.  Activities of daily living:  The patient is 100% independent in all ADLs: dressing, toileting, feeding as well as independent mobility  Home safety : The patient has smoke detectors in the home. They wear seatbelts.  There are no firearms at home. There is no violence in the home.   There is no risks for hepatitis, STDs or HIV. There is no   history of blood transfusion. They have no travel history to infectious disease endemic areas of the world.  The patient has seen their dentist in the last six month. They have seen their eye doctor in the last year. They admit to slight hearing difficulty with regard to whispered voices and some television programs.  They have deferred audiologic testing in the last year.  They do not  have excessive sun exposure. Discussed the need for sun protection: hats, long sleeves and use of sunscreen if there is significant sun exposure.   Diet: the importance of a healthy diet is discussed. They do have a healthy diet.  The benefits of regular aerobic exercise were discussed. She walks 4 times per week ,  20 minutes.   Depression screen: there are no signs or vegative symptoms of depression- irritability, change in appetite, anhedonia, sadness/tearfullness.  Cognitive assessment: the patient manages all their financial and personal affairs and is actively engaged. They could relate  day,date,year and events; recalled 2/3 objects at 3 minutes; performed clock-face test normally.  The following portions of the patient's history were reviewed and updated as appropriate: allergies, current medications, past family history, past medical history,  past surgical history, past social history  and problem list.  Visual acuity was not assessed per patient preference since she has regular follow up with her ophthalmologist. Hearing and body mass index were assessed and reviewed.   During the course of the visit the patient was educated and counseled about appropriate screening and preventive services including : fall prevention , diabetes screening, nutrition counseling, colorectal cancer screening, and recommended immunizations.    CC: The primary encounter diagnosis was Abnormal breast exam. Diagnoses of Medicare annual wellness visit, initial, Breast mass, right, and Menopause syndrome were also pertinent to this visit.  History Javeyah has a past medical history of GERD (gastroesophageal reflux disease); Colon polyps; Diverticulitis; Addison's disease ([place]); and Chicken pox.   She has past surgical history that includes varicose veins; Breast surgery; Appendectomy; Abdominal hysterectomy; and Foot surgery (Bilateral).   Her family history includes Cancer in her father, maternal aunt, maternal aunt, and maternal grandmother; Cancer (age of onset: 77) in her mother.She reports that she has never smoked. She has never used smokeless tobacco. She reports that she drinks about 4.2 oz of alcohol per week. She reports that she does not use illicit drugs.  Outpatient Prescriptions Prior to Visit  Medication Sig Dispense Refill  . ALPRAZolam Duanne Moron)  1 MG tablet Take 1 tablet (1 mg total) by mouth at bedtime as needed for anxiety. 30 tablet 2  . aspirin 81 MG tablet Take 81 mg by mouth once a week.     . calcium citrate (CALCITRATE - DOSED IN MG ELEMENTAL CALCIUM) 950 MG tablet Take 1 tablet  by mouth daily.    . Cholecalciferol (VITAMIN D3) 1000 UNITS CAPS Take 1 capsule by mouth daily.    . fludrocortisone (FLORINEF) 0.1 MG tablet TAKE 1 TABLET EVERY DAY 90 tablet 1  . hydrocortisone (CORTEF) 10 MG tablet TAKE TWO TABLETS EVERY MORNING AND TAKE ONE TABLET EVERY EVENING 270 tablet 1  . mirtazapine (REMERON) 30 MG tablet TAKE ONE TABLET AT BEDTIME 90 tablet 1  . Multiple Vitamins-Minerals (MULTIVITAMIN WITH MINERALS) tablet Take 1 tablet by mouth daily.    Marland Kitchen PREMARIN 0.3 MG tablet TAKE 1 TABLET EVERY DAY 90 tablet 3  . vitamin C (ASCORBIC ACID) 500 MG tablet Take 500 mg by mouth daily.    . celecoxib (CELEBREX) 200 MG capsule Take 1 capsule (200 mg total) by mouth 2 (two) times daily. 60 capsule 0  . polyethylene glycol (GOLYTELY/NULYTELY) 236 G solution Drink 8 ounces every 30 minutes until constipation is relieved 4000 mL 0  . tizanidine (ZANAFLEX) 2 MG capsule Take 1 capsule (2 mg total) by mouth 3 (three) times daily as needed for muscle spasms. 60 capsule 0   No facility-administered medications prior to visit.    Review of Systems   Patient denies headache, fevers, malaise, unintentional weight loss, skin rash, eye pain, sinus congestion and sinus pain, sore throat, dysphagia,  hemoptysis , cough, dyspnea, wheezing, chest pain, palpitations, orthopnea, edema, abdominal pain, nausea, melena, diarrhea, constipation, flank pain, dysuria, hematuria, urinary  Frequency, nocturia, numbness, tingling, seizures,  Focal weakness, Loss of consciousness,  Tremor, insomnia, depression, anxiety, and suicidal ideation.      Objective:   BP 102/60 mmHg  Pulse 87  Temp(Src) 98.1 F (36.7 C) (Oral)  Resp 12  Ht 5\' 4"  (1.626 m)  Wt 123 lb 8 oz (56.019 kg)  BMI 21.19 kg/m2  SpO2 95%  Physical Exam   General appearance: alert, cooperative and appears stated age Head: Normocephalic, without obvious abnormality, atraumatic Eyes: conjunctivae/corneas clear. PERRL, EOM's intact. Fundi  benign. Ears: normal TM's and external ear canals both ears Nose: Nares normal. Septum midline. Mucosa normal. No drainage or sinus tenderness. Throat: lips, mucosa, and tongue normal; teeth and gums normal Neck: no adenopathy, no carotid bruit, no JVD, supple, symmetrical, trachea midline and thyroid not enlarged, symmetric, no tenderness/mass/nodules Lungs: clear to auscultation bilaterally Breasts: right breast mass at the 11: 00 position , lateral to prior biopsy scar.  Left breast without masses or tenderness Heart: regular rate and rhythm, S1, S2 normal, no murmur, click, rub or gallop Abdomen: soft, non-tender; bowel sounds normal; no masses,  no organomegaly Extremities: extremities normal, atraumatic, no cyanosis or edema Pulses: 2+ and symmetric Skin: Skin color, texture, turgor normal. No rashes or lesions Neurologic: Alert and oriented X 3, normal strength and tone. Normal symmetric reflexes. Normal coordination and gait.    Assessment & Plan:   Problem List Items Addressed This Visit    Medicare annual wellness visit, initial    Annual Medicare wellness  exam was done as well as a comprehensive physical exam and management of acute and chronic conditions .  During the course of the visit the patient was educated and counseled about appropriate screening and  preventive services including : fall prevention , diabetes screening, nutrition counseling, colorectal cancer screening, and recommended immunizations.  Printed recommendations for health maintenance screenings was given.       Menopause syndrome    Not weanable due to intolerable flushing on every other day premarin.       Breast mass, right    Despite a normal mammogram tow months ago,  She has a cystic breast mass on the right that needs further evaluation. Diagnostic mammogram and ultrasound ordered.        Other Visit Diagnoses    Abnormal breast exam    -  Primary    Relevant Orders    MM Digital Diagnostic  Unilat R    US BREAST COMPLETE UNI RIGHT INC AXILLA       I have discontinued Ms. Rudin's polyethylene glycol, celecoxib, and tizanidine. I am also having her start on benzonatate and ipratropium. Additionally, I am having her maintain her aspirin, Vitamin D3, calcium citrate, multivitamin with minerals, vitamin C, PREMARIN, mirtazapine, ALPRAZolam, hydrocortisone, and fludrocortisone.  Meds ordered this encounter  Medications  . benzonatate (TESSALON) 100 MG capsule    Sig: Take 1 capsule (100 mg total) by mouth 2 (two) times daily as needed for cough.    Dispense:  20 capsule    Refill:  0  . ipratropium (ATROVENT) 0.06 % nasal spray    Sig: Place 2 sprays into both nostrils 4 (four) times daily.    Dispense:  15 mL    Refill:  12    Medications Discontinued During This Encounter  Medication Reason  . tizanidine (ZANAFLEX) 2 MG capsule Completed Course  . celecoxib (CELEBREX) 200 MG capsule Completed Course  . polyethylene glycol (GOLYTELY/NULYTELY) 236 G solution Completed Course    Follow-up: No Follow-up on file.   Crecencio Mc, MD

## 2016-03-18 NOTE — Progress Notes (Signed)
Pre-visit discussion using our clinic review tool. No additional management support is needed unless otherwise documented below in the visit note.  

## 2016-03-19 DIAGNOSIS — N6310 Unspecified lump in the right breast, unspecified quadrant: Secondary | ICD-10-CM | POA: Insufficient documentation

## 2016-03-19 NOTE — Assessment & Plan Note (Signed)

## 2016-03-19 NOTE — Assessment & Plan Note (Signed)
Not weanable due to intolerable flushing on every other day premarin.

## 2016-03-19 NOTE — Assessment & Plan Note (Signed)
Despite a normal mammogram tow months ago,  She has a cystic breast mass on the right that needs further evaluation. Diagnostic mammogram and ultrasound ordered.

## 2016-03-24 ENCOUNTER — Ambulatory Visit
Admission: RE | Admit: 2016-03-24 | Discharge: 2016-03-24 | Disposition: A | Discharge status: Home / Self Care | Payer: Medicare Other | Source: Ambulatory Visit | Attending: Internal Medicine | Admitting: Internal Medicine

## 2016-03-24 ENCOUNTER — Other Ambulatory Visit: Payer: Self-pay | Admitting: Internal Medicine

## 2016-03-24 DIAGNOSIS — N6459 Other signs and symptoms in breast: Secondary | ICD-10-CM | POA: Diagnosis present

## 2016-03-26 ENCOUNTER — Other Ambulatory Visit: Payer: Self-pay | Admitting: Internal Medicine

## 2016-04-16 ENCOUNTER — Telehealth: Payer: Self-pay | Admitting: *Deleted

## 2016-04-16 ENCOUNTER — Ambulatory Visit (INDEPENDENT_AMBULATORY_CARE_PROVIDER_SITE_OTHER): Payer: Medicare Other | Admitting: Internal Medicine

## 2016-04-16 ENCOUNTER — Encounter: Payer: Self-pay | Admitting: Internal Medicine

## 2016-04-16 VITALS — BP 110/70 | HR 86 | Temp 98.0°F | Resp 18 | Ht 64.0 in | Wt 125.0 lb

## 2016-04-16 DIAGNOSIS — W57XXXA Bitten or stung by nonvenomous insect and other nonvenomous arthropods, initial encounter: Secondary | ICD-10-CM

## 2016-04-16 DIAGNOSIS — S30861A Insect bite (nonvenomous) of abdominal wall, initial encounter: Secondary | ICD-10-CM

## 2016-04-16 MED ORDER — DOXYCYCLINE HYCLATE 100 MG PO CAPS: 100.0000 mg | ORAL_CAPSULE | Freq: Two times a day (BID) | ORAL | Status: DC

## 2016-04-16 NOTE — Telephone Encounter (Signed)
Scheduled for 11/30

## 2016-04-16 NOTE — Progress Notes (Signed)
Subjective:  Patient ID: Veronica Ferrell, female    DOB: 1951-09-15  Age: 65 y.o. MRN: [id number]  CC: The encounter diagnosis was Tick bite of abdominal wall, initial encounter.  HPI Veronica Ferrell presents for evaluation of a tick bite on the left side of her abdomen.  She removed the tick after a period of approximately 24 hours. The tick was engorged.  After removing the tick the area became reddened.  She denies headace, fevers,  Itching and drainage from the tick bite.  The redness has not spread.    Outpatient Prescriptions Prior to Visit  Medication Sig Dispense Refill  . ALPRAZolam (XANAX) 1 MG tablet Take 1 tablet (1 mg total) by mouth at bedtime as needed for anxiety. 30 tablet 2  . aspirin 81 MG tablet Take 81 mg by mouth once a week.     . calcium citrate (CALCITRATE - DOSED IN MG ELEMENTAL CALCIUM) 950 MG tablet Take 1 tablet by mouth daily.    . Cholecalciferol (VITAMIN D3) 1000 UNITS CAPS Take 1 capsule by mouth daily.    . fludrocortisone (FLORINEF) 0.1 MG tablet TAKE 1 TABLET EVERY DAY 90 tablet 1  . hydrocortisone (CORTEF) 10 MG tablet TAKE TWO TABLETS EVERY MORNING AND TAKE ONE TABLET EVERY EVENING 270 tablet 1  . ipratropium (ATROVENT) 0.06 % nasal spray Place 2 sprays into both nostrils 4 (four) times daily. 15 mL 12  . mirtazapine (REMERON) 30 MG tablet TAKE ONE TABLET AT BEDTIME 90 tablet 2  . Multiple Vitamins-Minerals (MULTIVITAMIN WITH MINERALS) tablet Take 1 tablet by mouth daily.    Marland Kitchen PREMARIN 0.3 MG tablet TAKE 1 TABLET EVERY DAY 90 tablet 2  . vitamin C (ASCORBIC ACID) 500 MG tablet Take 500 mg by mouth daily.    . benzonatate (TESSALON) 100 MG capsule Take 1 capsule (100 mg total) by mouth 2 (two) times daily as needed for cough. (Patient not taking: Reported on 04/16/2016) 20 capsule 0   No facility-administered medications prior to visit.    Review of Systems;  Patient denies headache, fevers, malaise, unintentional weight loss, skin rash, eye pain,  sinus congestion and sinus pain, sore throat, dysphagia,  hemoptysis , cough, dyspnea, wheezing, chest pain, palpitations, orthopnea, edema, abdominal pain, nausea, melena, diarrhea, constipation, flank pain, dysuria, hematuria, urinary  Frequency, nocturia, numbness, tingling, seizures,  Focal weakness, Loss of consciousness,  Tremor, insomnia, depression, anxiety, and suicidal ideation.      Objective:  BP 110/70 mmHg  Pulse 86  Temp(Src) 98 F (36.7 C) (Oral)  Resp 18  Ht 5\' 4"  (1.626 m)  Wt 125 lb (56.7 kg)  BMI 21.45 kg/m2  SpO2 95%  BP Readings from Last 3 Encounters:  04/16/16 110/70  03/18/16 102/60  01/30/16 102/70    Wt Readings from Last 3 Encounters:  04/16/16 125 lb (56.7 kg)  03/18/16 123 lb 8 oz (56.019 kg)  01/30/16 126 lb 8 oz (57.38 kg)    General appearance: alert, cooperative and appears stated age Neck: no adenopathy, no carotid bruit, supple, symmetrical, trachea midline and thyroid not enlarged, symmetric, no tenderness/mass/nodules Heart: regular rate and rhythm, S1, S2 normal, no murmur, click, rub or gallop Abdomen: soft, non-tender; bowel sounds normal; no masses,  no organomegaly Pulses: 2+ and symmetric Skin: small erythematous papule on lateral abdominal wall.  No rashes or lesions Lymph nodes: Cervical, supraclavicular, and axillary nodes normal.  No results found for: HGBA1C  Lab Results  Component Value Date   CREATININE  0.84 03/07/2016   CREATININE 0.84 03/09/2015   CREATININE 0.9 03/07/2014    Lab Results  Component Value Date   WBC 10.9* 03/07/2016   HGB 13.7 03/07/2016   HCT 40.9 03/07/2016   PLT 368.0 03/07/2016   GLUCOSE 92 03/07/2016   CHOL 203* 03/07/2016   TRIG 91.0 03/07/2016   HDL 82.90 03/07/2016   LDLCALC 102* 03/07/2016   ALT 12 03/07/2016   AST 16 03/07/2016   NA 134* 03/07/2016   K 4.8 03/07/2016   CL 99 03/07/2016   CREATININE 0.84 03/07/2016   BUN 22 03/07/2016   CO2 27 03/07/2016   TSH 2.60 03/07/2016      US Breast [place] Axilla  03/24/2016  CLINICAL DATA:  Patient had a normal screening mammogram 01/09/2016. On recent clinical exam a palpable abnormality was felt in the upper portion of the right breast. EXAM: 2D DIGITAL DIAGNOSTIC RIGHT MAMMOGRAM WITH CAD AND ADJUNCT TOMO ULTRASOUND RIGHT BREAST COMPARISON:  Previous exam(s). ACR Breast Density Category c: The breast tissue is heterogeneously dense, which may obscure small masses. FINDINGS: No suspicious mass, malignant type microcalcifications or distortion detected in the right breast. Mammographic images were processed with CAD. On physical exam, I do not palpate a discrete mass in the upper portion of the right breast. Targeted ultrasound is performed, showing normal dense fibroglandular tissue throughout the upper aspect of the right breast. No solid or cystic mass, abnormal shadowing or distortion visualized. IMPRESSION: No evidence of malignancy in the right breast. RECOMMENDATION: Bilateral screening mammogram in January of 2018 is recommended. I have discussed the findings and recommendations with the patient. Results were also provided in writing at the conclusion of the visit. If applicable, a reminder letter will be sent to the patient regarding the next appointment. BI-RADS CATEGORY  1: Negative. Electronically Signed   By: Lillia Mountain M.D.   On: 03/24/2016 11:19   Mm Diag Breast [place] Uni Right  03/24/2016  CLINICAL DATA:  Patient had a normal screening mammogram 01/09/2016. On recent clinical exam a palpable abnormality was felt in the upper portion of the right breast. EXAM: 2D DIGITAL DIAGNOSTIC RIGHT MAMMOGRAM WITH CAD AND ADJUNCT TOMO ULTRASOUND RIGHT BREAST COMPARISON:  Previous exam(s). ACR Breast Density Category c: The breast tissue is heterogeneously dense, which may obscure small masses. FINDINGS: No suspicious mass, malignant type microcalcifications or distortion detected in the right breast. Mammographic images were  processed with CAD. On physical exam, I do not palpate a discrete mass in the upper portion of the right breast. Targeted ultrasound is performed, showing normal dense fibroglandular tissue throughout the upper aspect of the right breast. No solid or cystic mass, abnormal shadowing or distortion visualized. IMPRESSION: No evidence of malignancy in the right breast. RECOMMENDATION: Bilateral screening mammogram in January of 2018 is recommended. I have discussed the findings and recommendations with the patient. Results were also provided in writing at the conclusion of the visit. If applicable, a reminder letter will be sent to the patient regarding the next appointment. BI-RADS CATEGORY  1: Negative. Electronically Signed   By: Lillia Mountain M.D.   On: 03/24/2016 11:19    Assessment & Plan:   Problem List Items Addressed This Visit    Tick bite of abdominal wall - Primary    Reassurance provided that there eis no signs of infection currently and no abx are needed at this time.  There are no retained tick parts.  rx for doxycycline given and instructions to  start if symptoms of RMSF or cellulitis develop.          I am having Ms. Goodreau start on doxycycline. I am also having her maintain her aspirin, Vitamin D3, calcium citrate, multivitamin with minerals, vitamin C, ALPRAZolam, hydrocortisone, fludrocortisone, benzonatate, ipratropium, mirtazapine, and PREMARIN.  Meds ordered this encounter  Medications  . doxycycline (VIBRAMYCIN) 100 MG capsule    Sig: Take 1 capsule (100 mg total) by mouth 2 (two) times daily.    Dispense:  14 capsule    Refill:  0    There are no discontinued medications.  Follow-up: No Follow-up on file.   Crecencio Mc, MD

## 2016-04-16 NOTE — Progress Notes (Signed)
Pre-visit discussion using our clinic review tool. No additional management support is needed unless otherwise documented below in the visit note.  

## 2016-04-16 NOTE — Patient Instructions (Signed)
You do not need to start the doxycycline  now,  But if the redness spreads,  Or you develop achiness, or headaches,  Start it and take with food twice daily    Please take a probiotic ( Align, Floraque or Culturelle), the generic version of one of these over the counter medications, or an alternative form (kombucha,  Kevita, Yogurt, or another dietary source) for a minimum of 3 weeks to prevent a serious antibiotic associated diarrhea  Called clostridium dificile colitis.  Taking a probiotic may also prevent vaginitis due to yeast infections and can be continued indefinitely if you feel that it improves your digestion or your elimination (bowels).

## 2016-04-16 NOTE — Telephone Encounter (Signed)
Patient pulled a tick off of her of her mis area yesterday morning, 24 hours later the area around the bite site is 1/4 inch red and itchy. Patient questioned if she should be seen?  Pt Contact  [phone]

## 2016-04-18 DIAGNOSIS — S30861A Insect bite (nonvenomous) of abdominal wall, initial encounter: Secondary | ICD-10-CM | POA: Insufficient documentation

## 2016-04-18 DIAGNOSIS — W57XXXA Bitten or stung by nonvenomous insect and other nonvenomous arthropods, initial encounter: Secondary | ICD-10-CM

## 2016-04-18 NOTE — Assessment & Plan Note (Signed)
Reassurance provided that there eis no signs of infection currently and no abx are needed at this time.  There are no retained tick parts.  rx for doxycycline given and instructions to start if symptoms of RMSF or cellulitis develop.

## 2016-05-30 ENCOUNTER — Other Ambulatory Visit: Payer: Self-pay | Admitting: Internal Medicine

## 2016-06-03 ENCOUNTER — Ambulatory Visit (INDEPENDENT_AMBULATORY_CARE_PROVIDER_SITE_OTHER): Payer: Medicare Other | Admitting: Family

## 2016-06-03 ENCOUNTER — Encounter: Payer: Self-pay | Admitting: Family

## 2016-06-03 ENCOUNTER — Telehealth: Payer: Self-pay | Admitting: Family

## 2016-06-03 ENCOUNTER — Ambulatory Visit
Admission: RE | Admit: 2016-06-03 | Discharge: 2016-06-03 | Disposition: A | Discharge status: Home / Self Care | Payer: Medicare Other | Source: Ambulatory Visit | Attending: Family | Admitting: Family

## 2016-06-03 VITALS — BP 100/70 | HR 75 | Temp 98.3°F | Wt 123.8 lb

## 2016-06-03 DIAGNOSIS — M2550 Pain in unspecified joint: Secondary | ICD-10-CM | POA: Diagnosis not present

## 2016-06-03 DIAGNOSIS — K5730 Diverticulosis of large intestine without perforation or abscess without bleeding: Secondary | ICD-10-CM | POA: Diagnosis not present

## 2016-06-03 DIAGNOSIS — R195 Other fecal abnormalities: Secondary | ICD-10-CM | POA: Insufficient documentation

## 2016-06-03 DIAGNOSIS — Z9049 Acquired absence of other specified parts of digestive tract: Secondary | ICD-10-CM | POA: Insufficient documentation

## 2016-06-03 DIAGNOSIS — Z90710 Acquired absence of both cervix and uterus: Secondary | ICD-10-CM | POA: Diagnosis not present

## 2016-06-03 DIAGNOSIS — R1030 Lower abdominal pain, unspecified: Secondary | ICD-10-CM

## 2016-06-03 DIAGNOSIS — R51 Headache: Secondary | ICD-10-CM

## 2016-06-03 DIAGNOSIS — E875 Hyperkalemia: Secondary | ICD-10-CM

## 2016-06-03 DIAGNOSIS — R519 Headache, unspecified: Secondary | ICD-10-CM

## 2016-06-03 LAB — CBC WITH DIFFERENTIAL/PLATELET
BASOS PCT: 0.4 % (ref 0.0–3.0)
Basophils Absolute: 0 10*3/uL (ref 0.0–0.1)
EOS ABS: 0.1 10*3/uL (ref 0.0–0.7)
EOS PCT: 0.8 % (ref 0.0–5.0)
HCT: 44.7 % (ref 36.0–46.0)
HEMOGLOBIN: 14.8 g/dL (ref 12.0–15.0)
LYMPHS ABS: 2.4 10*3/uL (ref 0.7–4.0)
LYMPHS PCT: 26.8 % (ref 12.0–46.0)
MCHC: 33.1 g/dL (ref 30.0–36.0)
MCV: 96 fl (ref 78.0–100.0)
MONO ABS: 0.5 10*3/uL (ref 0.1–1.0)
MONOS PCT: 5.3 % (ref 3.0–12.0)
NEUTROS ABS: 6 10*3/uL (ref 1.4–7.7)
Neutrophils Relative %: 66.7 % (ref 43.0–77.0)
Platelets: 362 10*3/uL (ref 150.0–400.0)
RBC: 4.66 Mil/uL (ref 3.87–5.11)
RDW: 14 % (ref 11.5–15.5)
WBC: 9.1 10*3/uL (ref 4.0–10.5)

## 2016-06-03 LAB — COMPREHENSIVE METABOLIC PANEL
ALK PHOS: 29 U/L — AB (ref 39–117)
ALT: 13 U/L (ref 0–35)
AST: 17 U/L (ref 0–37)
Albumin: 4.6 g/dL (ref 3.5–5.2)
BUN: 25 mg/dL — AB (ref 6–23)
CHLORIDE: 97 meq/L (ref 96–112)
CO2: 28 meq/L (ref 19–32)
Calcium: 10.6 mg/dL — ABNORMAL HIGH (ref 8.4–10.5)
Creatinine, Ser: 0.84 mg/dL (ref 0.40–1.20)
GFR: 71.85 mL/min (ref >60.00)
GLUCOSE: 91 mg/dL (ref 70–99)
POTASSIUM: 5.8 meq/L — AB (ref 3.5–5.1)
SODIUM: 134 meq/L — AB (ref 135–145)
TOTAL PROTEIN: 7.2 g/dL (ref 6.0–8.3)
Total Bilirubin: 0.5 mg/dL (ref 0.2–1.2)

## 2016-06-03 LAB — LIPASE: Lipase: 48 U/L (ref 11.0–59.0)

## 2016-06-03 MED ORDER — CIPROFLOXACIN HCL 500 MG PO TABS: 500.0000 mg | ORAL_TABLET | Freq: Two times a day (BID) | ORAL | Status: DC

## 2016-06-03 MED ORDER — IOPAMIDOL (ISOVUE-300) INJECTION 61%
100.0000 mL | Freq: Once | INTRAVENOUS | Status: AC | PRN
  Administered 2016-06-03: 100 mL via INTRAVENOUS

## 2016-06-03 MED ORDER — METRONIDAZOLE 500 MG PO TABS: 500.0000 mg | ORAL_TABLET | Freq: Three times a day (TID) | ORAL | Status: DC

## 2016-06-03 NOTE — Patient Instructions (Signed)
Lab work and CT abdomen.  Will call with all results.   I went ahead and sent antibiotics to pharmacy.   If there is no improvement in your symptoms, or if there is any worsening of symptoms, or if you have any additional concerns, please return for re-evaluation; or, if we are closed, consider going to the Emergency Room for evaluation if symptoms urgent.

## 2016-06-03 NOTE — Telephone Encounter (Signed)
Spoke with patient regarding results of CT abdomen. 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 . Patient stated she has no worsening or frequent back pain to require further evaluation of cyst at this time.   CT abdomen also showed prominent stool burden and I explain some of her symptoms likely related to constipation. Also reviewed lab work with her and explained her potassium is high , and we would like to recheck this tomorrow. Patient verbalized plan and states she will come back to recheck blood work tomorrow. I advised her not to take antibiotics at this time.

## 2016-06-03 NOTE — Progress Notes (Signed)
Subjective:    Patient ID: Veronica Ferrell, female    DOB: 1951/06/16, 65 y.o.   MRN: [id number]   Veronica Ferrell is a 65 y.o. female who presents today for an acute visit.    HPI Comments: Patient here for evaluation of multiple complaints. She describes a dull headache since yesterday. Largely resolved now. Rates pain 2/10. Relieved with Advil.  She complains of nausea and diffuse abdominal 'gnawing' discomfort. No vomiting,diarrhea, new tick bites, rash,sinus pressure, cough, fever, or chills. She also notes ankles 'feel heavy' which started today.She also noted posterior neck pain which started today. Feels somewhat stiff. No numbness or tingling in neck or arms. No  LE swelling. Endorses fatigue and states she didn't sleep well last night.   Has Addison's Disease and follows with Dr. Derrel Nip and states that she feels the disease is stable right now 'doesn't feel 'out of whack.'  History of diverticulitis. She was hospitalized in the past for flare.   Past Medical History  Diagnosis Date  . GERD (gastroesophageal reflux disease)   . Colon polyps   . Diverticulitis   . Addison's disease ([employer])   . Chicken pox    Allergies: Alendronate sodium; Boniva; Clindamycin/lincomycin; and Penicillins Current Outpatient Prescriptions on File Prior to Visit  Medication Sig Dispense Refill  . ALPRAZolam (XANAX) 1 MG tablet Take 1 tablet (1 mg total) by mouth at bedtime as needed for anxiety. 30 tablet 2  . aspirin 81 MG tablet Take 81 mg by mouth once a week.     . calcium citrate (CALCITRATE - DOSED IN MG ELEMENTAL CALCIUM) 950 MG tablet Take 1 tablet by mouth daily.    . Cholecalciferol (VITAMIN D3) 1000 UNITS CAPS Take 1 capsule by mouth daily.    . fludrocortisone (FLORINEF) 0.1 MG tablet TAKE 1 TABLET EVERY DAY 90 tablet 1  . hydrocortisone (CORTEF) 10 MG tablet TAKE TWO TABLETS EVERY MORNING AND TAKE ONE TABLET EVERY EVENING 270 tablet 1  . ipratropium (ATROVENT) 0.06 % nasal spray Place  2 sprays into both nostrils 4 (four) times daily. 15 mL 12  . mirtazapine (REMERON) 30 MG tablet TAKE ONE TABLET AT BEDTIME 90 tablet 2  . Multiple Vitamins-Minerals (MULTIVITAMIN WITH MINERALS) tablet Take 1 tablet by mouth daily.    . polyethylene glycol powder (GLYCOLAX/MIRALAX) powder USE AS DIRECTED 527 g 3  . PREMARIN 0.3 MG tablet TAKE 1 TABLET EVERY DAY 90 tablet 2  . vitamin C (ASCORBIC ACID) 500 MG tablet Take 500 mg by mouth daily.     No current facility-administered medications on file prior to visit.    Social History  Substance Use Topics  . Smoking status: Never Smoker   . Smokeless tobacco: Never Used  . Alcohol Use: 4.2 oz/week    7 Glasses of wine per week     Comment: WINE EACH WEEK    Review of Systems  Constitutional: Negative for fever and chills.  HENT: Negative for congestion and sinus pressure.   Respiratory: Negative for cough, shortness of breath and wheezing.   Cardiovascular: Negative for chest pain, palpitations and leg swelling.  Gastrointestinal: Positive for abdominal pain. Negative for nausea, vomiting, diarrhea, constipation and abdominal distention.  Genitourinary: Negative for dysuria and flank pain.  Musculoskeletal: Positive for arthralgias. Negative for joint swelling.  Skin: Negative for rash.  Neurological: Positive for headaches. Negative for dizziness and numbness.      Objective:    BP 100/70 mmHg  Pulse 75  Temp(Src)  98.3 F (36.8 C)  Wt 123 lb 12.8 oz (56.155 kg)  SpO2 97%   Physical Exam  Constitutional: She appears well-developed and well-nourished.  HENT:  Head: Normocephalic and atraumatic.  Right Ear: Hearing, tympanic membrane, external ear and ear canal normal. No swelling or tenderness. Tympanic membrane is not erythematous and not bulging. No middle ear effusion.  Left Ear: Tympanic membrane, external ear and ear canal normal. No swelling or tenderness. Tympanic membrane is not erythematous and not bulging.  No  middle ear effusion.  Nose: Nose normal. No rhinorrhea. Right sinus exhibits no maxillary sinus tenderness and no frontal sinus tenderness. Left sinus exhibits no maxillary sinus tenderness and no frontal sinus tenderness.  Mouth/Throat: Uvula is midline, oropharynx is clear and moist and mucous membranes are normal. No posterior oropharyngeal edema or posterior oropharyngeal erythema.  Eyes: Conjunctivae, EOM and lids are normal. Pupils are equal, round, and reactive to light. Lids are everted and swept, no foreign bodies found.  Normal fundus bilaterally  Cardiovascular: Normal rate, regular rhythm, normal heart sounds and normal pulses.   No LE swelling.   Pulmonary/Chest: Effort normal and breath sounds normal. She has no wheezes. She has no rhonchi. She has no rales.  Abdominal: Soft. Normal appearance. She exhibits no distension, no fluid wave, no ascites and no mass. Bowel sounds are increased. There is tenderness in the right lower quadrant and left lower quadrant. There is no rigidity, no rebound, no guarding, no CVA tenderness, no tenderness at McBurney's point and negative Murphy's sign.  Musculoskeletal:       Right ankle: She exhibits normal range of motion and no swelling. No tenderness.       Left ankle: Normal. She exhibits normal range of motion and no swelling. No tenderness.       Cervical back: She exhibits normal range of motion, no tenderness and no bony tenderness.  Lymphadenopathy:       Head (right side): No submental, no submandibular, no tonsillar, no preauricular, no posterior auricular and no occipital adenopathy present.       Head (left side): No submental, no submandibular, no tonsillar, no preauricular, no posterior auricular and no occipital adenopathy present.    She has no cervical adenopathy.       Right cervical: No superficial cervical, no deep cervical and no posterior cervical adenopathy present.      Left cervical: No superficial cervical, no deep cervical  and no posterior cervical adenopathy present.  Neurological: She is alert. She has normal strength. No cranial nerve deficit or sensory deficit. She displays a negative Romberg sign.  Reflex Scores:      Bicep reflexes are 2+ on the right side and 2+ on the left side.      Patellar reflexes are 2+ on the right side and 2+ on the left side. Grip equal and strong bilateral upper extremities. Gait strong and steady. Able to perform  finger-to-nose without difficulty.   Skin: Skin is warm and dry.  Psychiatric: She has a normal mood and affect. Her speech is normal and behavior is normal. Thought content normal.  Vitals reviewed.      Assessment & Plan:   1. Lower abdominal pain Concern for diverticulitis. History of diverticulitis and focal abdominal pain. Pending CT abdomen and pelvis.  - Comprehensive metabolic panel; Future - Lipase; Future - CBC with Differential/Platelet; Future - ciprofloxacin (CIPRO) 500 MG tablet; Take 1 tablet (500 mg total) by mouth 2 (two) times daily.  Dispense: 14  tablet; Refill: 0 - metroNIDAZOLE (FLAGYL) 500 MG tablet; Take 1 tablet (500 mg total) by mouth 3 (three) times daily.  Dispense: 21 tablet; Refill: 0 - CT Abdomen Pelvis W Contrast; Future  2. Arthralgias Joint pain bilateral ankles and posterior neck is nonspecific at this time. Full ROM, no point tenderness of joints. Due to patient's level of concern with recent tick bites, further evaluation with Lyme titers. As symptoms have only been present for 2 days, patient and I agreed that we would closely observe, and if symptoms change, or worsen, she will follow with her PCP for further evaluation.   - Lyme Ab/Western Blot Reflex  3. Headache.  I'm reassured by normal neurologic exam and that the headache has largely resolved today. Patient I agreed that no imaging is necessary at this time. If headache recurs or worsens, patient will let us know.  I have discontinued Ms. Morken's benzonatate and  doxycycline. I am also having her maintain her aspirin, Vitamin D3, calcium citrate, multivitamin with minerals, vitamin C, ALPRAZolam, hydrocortisone, fludrocortisone, ipratropium, mirtazapine, PREMARIN, and polyethylene glycol powder.   No orders of the defined types were placed in this encounter.     Start medications as prescribed and explained to patient on After Visit Summary ( AVS). Risks, benefits, and alternatives of the medications and treatment plan prescribed today were discussed, and patient expressed understanding.   Education regarding symptom management and diagnosis given to patient.   Follow-up:Plan follow-up and return precautions given if any worsening symptoms or change in condition.   Continue to follow with Crecencio Mc, MD for routine health maintenance.   Doreene Nest and I agreed with plan.   Mable Paris, FNP

## 2016-06-04 ENCOUNTER — Other Ambulatory Visit (INDEPENDENT_AMBULATORY_CARE_PROVIDER_SITE_OTHER): Payer: Medicare Other

## 2016-06-04 ENCOUNTER — Encounter: Payer: Self-pay | Admitting: Family

## 2016-06-04 DIAGNOSIS — E875 Hyperkalemia: Secondary | ICD-10-CM | POA: Diagnosis not present

## 2016-06-04 LAB — BASIC METABOLIC PANEL
BUN: 22 mg/dL (ref 6–23)
CHLORIDE: 98 meq/L (ref 96–112)
CO2: 28 meq/L (ref 19–32)
Calcium: 9.4 mg/dL (ref 8.4–10.5)
Creatinine, Ser: 0.9 mg/dL (ref 0.40–1.20)
GFR: 66.35 mL/min (ref >60.00)
GLUCOSE: 92 mg/dL (ref 70–99)
POTASSIUM: 5.1 meq/L (ref 3.5–5.1)
SODIUM: 134 meq/L — AB (ref 135–145)

## 2016-06-04 LAB — LYME AB/WESTERN BLOT REFLEX

## 2016-07-28 ENCOUNTER — Other Ambulatory Visit: Payer: Self-pay | Admitting: Internal Medicine

## 2016-07-28 NOTE — Telephone Encounter (Signed)
Okay to refill hydrocortisone 10mg   Tablets? Last seen in April & next appt in April 2018.

## 2016-10-21 ENCOUNTER — Other Ambulatory Visit: Payer: Self-pay | Admitting: Internal Medicine

## 2016-10-21 DIAGNOSIS — Z1231 Encounter for screening mammogram for malignant neoplasm of breast: Secondary | ICD-10-CM

## 2016-10-30 ENCOUNTER — Other Ambulatory Visit: Payer: Self-pay | Admitting: Internal Medicine

## 2016-11-07 ENCOUNTER — Telehealth: Payer: Self-pay | Admitting: Internal Medicine

## 2016-11-07 MED ORDER — ESTRADIOL 0.5 MG PO TABS: 0.5000 mg | ORAL_TABLET | Freq: Every day | ORAL | 2 refills | Status: DC

## 2016-11-07 MED ORDER — ALPRAZOLAM 1 MG PO TABS: 1.0000 mg | ORAL_TABLET | Freq: Every evening | ORAL | 0 refills | Status: DC | PRN

## 2016-11-07 NOTE — Telephone Encounter (Signed)
mychart message has been sent

## 2016-11-07 NOTE — Telephone Encounter (Signed)
Please call, thanks 

## 2016-11-07 NOTE — Telephone Encounter (Signed)
Estradiol 0.5 mg tablet sent in place of Premarin  The alprazolam was refilled for 30 days as a courtesy ,  Please set her up for a 6 month follow up which is required for refill on all controlled substances

## 2016-11-07 NOTE — Telephone Encounter (Signed)
No  OV since 4/17 ok to fill alprazolam? Patient insurance will not cover premarin, patient willing to try something else.

## 2016-11-07 NOTE — Telephone Encounter (Signed)
Pt dropped off a form from her insurance company. Insurance will not cover PREMARIN 0.3 MG tablet and she is willing to try another medication, oral only.   Also, pt needs a refill on her ALPRAZolam (XANAX) 1 MG tablet.   Insurance papers are up front in [employer]

## 2016-11-07 NOTE — Telephone Encounter (Signed)
Left message for patient to return call back.  

## 2016-11-10 ENCOUNTER — Other Ambulatory Visit: Payer: Self-pay | Admitting: Internal Medicine

## 2016-11-10 MED ORDER — ALPRAZOLAM 1 MG PO TABS: 1.0000 mg | ORAL_TABLET | Freq: Every evening | ORAL | 4 refills | Status: DC | PRN

## 2016-12-18 ENCOUNTER — Telehealth: Payer: Self-pay | Admitting: Internal Medicine

## 2016-12-18 NOTE — Telephone Encounter (Signed)
Your insurance is refusing to pay for Premarin tablets unless you try 2 of the alternatives  That are covered,  Which include premarin vaginal cream, Evista, and several other vaginal estrogen alternatives.  Would you lie to try plain old estradiol?

## 2017-01-05 ENCOUNTER — Other Ambulatory Visit: Payer: Self-pay | Admitting: Internal Medicine

## 2017-01-05 NOTE — Telephone Encounter (Signed)
Refilled 03/26/16. Pt last seen 03/18/16 for this. Please advise?

## 2017-01-09 ENCOUNTER — Ambulatory Visit: Payer: Medicare Other

## 2017-02-02 ENCOUNTER — Other Ambulatory Visit: Payer: Self-pay | Admitting: Internal Medicine

## 2017-02-02 ENCOUNTER — Ambulatory Visit
Admission: RE | Admit: 2017-02-02 | Discharge: 2017-02-02 | Disposition: A | Discharge status: Home / Self Care | Payer: Medicare Other | Source: Ambulatory Visit | Attending: Internal Medicine | Admitting: Internal Medicine

## 2017-02-02 DIAGNOSIS — Z1231 Encounter for screening mammogram for malignant neoplasm of breast: Secondary | ICD-10-CM

## 2017-02-02 NOTE — Telephone Encounter (Signed)
Pt last refill on Florinef was on 10/31/16 and has a future scheduled appt on 03/25/17. Last OV was on 04/16/16. Ok to refill?

## 2017-02-02 NOTE — Telephone Encounter (Signed)
YES,OK TO REFILL

## 2017-02-24 ENCOUNTER — Other Ambulatory Visit: Payer: Self-pay | Admitting: Internal Medicine

## 2017-02-25 ENCOUNTER — Telehealth: Payer: Self-pay | Admitting: Internal Medicine

## 2017-02-25 DIAGNOSIS — R5383 Other fatigue: Secondary | ICD-10-CM

## 2017-02-25 DIAGNOSIS — E039 Hypothyroidism, unspecified: Secondary | ICD-10-CM

## 2017-02-25 DIAGNOSIS — K5909 Other constipation: Secondary | ICD-10-CM

## 2017-02-25 DIAGNOSIS — M8588 Other specified disorders of bone density and structure, other site: Secondary | ICD-10-CM

## 2017-02-25 DIAGNOSIS — E7800 Pure hypercholesterolemia, unspecified: Secondary | ICD-10-CM

## 2017-02-25 NOTE — Telephone Encounter (Signed)
Please advise what labs would you like drawn?

## 2017-02-25 NOTE — Telephone Encounter (Signed)
Pt called wanting to get her blood work done before her appt on 03/25/2017 Need order please and thank you!  Call pt @ [phone].

## 2017-02-26 NOTE — Telephone Encounter (Signed)
Fasting labs ordered

## 2017-02-26 NOTE — Telephone Encounter (Signed)
Spoke with pt and scheduled her a lab appt for 03/19/2017 @ 9:30am. Pt is aware she needs to be fasting for this lab appt.

## 2017-03-19 ENCOUNTER — Other Ambulatory Visit (INDEPENDENT_AMBULATORY_CARE_PROVIDER_SITE_OTHER): Payer: Medicare Other

## 2017-03-19 DIAGNOSIS — M8588 Other specified disorders of bone density and structure, other site: Secondary | ICD-10-CM

## 2017-03-19 DIAGNOSIS — E039 Hypothyroidism, unspecified: Secondary | ICD-10-CM

## 2017-03-19 DIAGNOSIS — E7800 Pure hypercholesterolemia, unspecified: Secondary | ICD-10-CM

## 2017-03-19 DIAGNOSIS — R5383 Other fatigue: Secondary | ICD-10-CM

## 2017-03-19 LAB — COMPREHENSIVE METABOLIC PANEL
ALT: 13 U/L (ref 0–35)
AST: 16 U/L (ref 0–37)
Albumin: 4.5 g/dL (ref 3.5–5.2)
Alkaline Phosphatase: 27 U/L — ABNORMAL LOW (ref 39–117)
BUN: 22 mg/dL (ref 6–23)
CO2: 29 mEq/L (ref 19–32)
Calcium: 9.5 mg/dL (ref 8.4–10.5)
Chloride: 99 mEq/L (ref 96–112)
Creatinine, Ser: 0.95 mg/dL (ref 0.40–1.20)
GFR: 62.19 mL/min (ref >60.00)
GLUCOSE: 94 mg/dL (ref 70–99)
POTASSIUM: 5.2 meq/L — AB (ref 3.5–5.1)
SODIUM: 133 meq/L — AB (ref 135–145)
Total Bilirubin: 0.5 mg/dL (ref 0.2–1.2)
Total Protein: 6.8 g/dL (ref 6.0–8.3)

## 2017-03-19 LAB — LIPID PANEL
CHOL/HDL RATIO: 2
Cholesterol: 218 mg/dL — ABNORMAL HIGH (ref 0–200)
HDL: 90.5 mg/dL (ref >39.00)
LDL Cholesterol: 108 mg/dL — ABNORMAL HIGH (ref 0–99)
NonHDL: 127.39
Triglycerides: 97 mg/dL (ref 0.0–149.0)
VLDL: 19.4 mg/dL (ref 0.0–40.0)

## 2017-03-19 LAB — CBC WITH DIFFERENTIAL/PLATELET
BASOS PCT: 0.7 % (ref 0.0–3.0)
Basophils Absolute: 0.1 10*3/uL (ref 0.0–0.1)
EOS PCT: 1.6 % (ref 0.0–5.0)
Eosinophils Absolute: 0.1 10*3/uL (ref 0.0–0.7)
HEMATOCRIT: 42.7 % (ref 36.0–46.0)
Hemoglobin: 14.1 g/dL (ref 12.0–15.0)
LYMPHS PCT: 48 % — AB (ref 12.0–46.0)
Lymphs Abs: 3.9 10*3/uL (ref 0.7–4.0)
MCHC: 33.1 g/dL (ref 30.0–36.0)
MCV: 97.6 fl (ref 78.0–100.0)
MONOS PCT: 9 % (ref 3.0–12.0)
Monocytes Absolute: 0.7 10*3/uL (ref 0.1–1.0)
NEUTROS ABS: 3.3 10*3/uL (ref 1.4–7.7)
Neutrophils Relative %: 40.7 % — ABNORMAL LOW (ref 43.0–77.0)
PLATELETS: 368 10*3/uL (ref 150.0–400.0)
RBC: 4.38 Mil/uL (ref 3.87–5.11)
RDW: 13.5 % (ref 11.5–15.5)
WBC: 8 10*3/uL (ref 4.0–10.5)

## 2017-03-19 LAB — TSH: TSH: 3.7 u[IU]/mL (ref 0.35–4.50)

## 2017-03-19 LAB — VITAMIN D 25 HYDROXY (VIT D DEFICIENCY, FRACTURES): VITD: 52.01 ng/mL (ref 30.00–100.00)

## 2017-03-22 ENCOUNTER — Encounter: Payer: Self-pay | Admitting: Internal Medicine

## 2017-03-25 ENCOUNTER — Ambulatory Visit (INDEPENDENT_AMBULATORY_CARE_PROVIDER_SITE_OTHER): Payer: Medicare Other | Admitting: Internal Medicine

## 2017-03-25 ENCOUNTER — Encounter: Payer: Self-pay | Admitting: Internal Medicine

## 2017-03-25 VITALS — BP 100/62 | HR 88 | Temp 98.1°F | Resp 16 | Ht 64.0 in | Wt 126.8 lb

## 2017-03-25 DIAGNOSIS — Z0000 Encounter for general adult medical examination without abnormal findings: Secondary | ICD-10-CM

## 2017-03-25 DIAGNOSIS — E2839 Other primary ovarian failure: Secondary | ICD-10-CM | POA: Diagnosis not present

## 2017-03-25 DIAGNOSIS — K219 Gastro-esophageal reflux disease without esophagitis: Secondary | ICD-10-CM | POA: Insufficient documentation

## 2017-03-25 DIAGNOSIS — N951 Menopausal and female climacteric states: Secondary | ICD-10-CM | POA: Diagnosis not present

## 2017-03-25 DIAGNOSIS — E271 Primary adrenocortical insufficiency: Secondary | ICD-10-CM

## 2017-03-25 DIAGNOSIS — K222 Esophageal obstruction: Secondary | ICD-10-CM | POA: Diagnosis not present

## 2017-03-25 MED ORDER — OMEPRAZOLE 40 MG PO CPDR: 40.0000 mg | DELAYED_RELEASE_CAPSULE | Freq: Every day | ORAL | 3 refills | Status: DC

## 2017-03-25 MED ORDER — ESTRADIOL 1 MG PO TABS: 1.0000 mg | ORAL_TABLET | Freq: Every day | ORAL | 5 refills | Status: DC

## 2017-03-25 NOTE — Progress Notes (Signed)
Pre visit review using our clinic review tool, if applicable. No additional management support is needed unless otherwise documented below in the visit note. 

## 2017-03-25 NOTE — Patient Instructions (Signed)
I increased your estradiol dose to 1.0 mg daily   Barium swallow study and referral to Dr Roselyn Reef are in process  Pleasee resume omeprazole daily using the rx I sent in    Health Maintenance for Postmenopausal Women Menopause is a normal process in which your reproductive ability comes to an end. This process happens gradually over a span of months to years, usually between the ages of 67 and 51. Menopause is complete when you have missed 12 consecutive menstrual periods. It is important to talk with your health care provider about some of the most common conditions that affect postmenopausal women, such as heart disease, cancer, and bone loss (osteoporosis). Adopting a healthy lifestyle and getting preventive care can help to promote your health and wellness. Those actions can also lower your chances of developing some of these common conditions. What should I know about menopause? During menopause, you may experience a number of symptoms, such as:  Moderate-to-severe hot flashes.  Night sweats.  Decrease in sex drive.  Mood swings.  Headaches.  Tiredness.  Irritability.  Memory problems.  Insomnia. Choosing to treat or not to treat menopausal changes is an individual decision that you make with your health care provider. What should I know about hormone replacement therapy and supplements? Hormone therapy products are effective for treating symptoms that are associated with menopause, such as hot flashes and night sweats. Hormone replacement carries certain risks, especially as you become older. If you are thinking about using estrogen or estrogen with progestin treatments, discuss the benefits and risks with your health care provider. What should I know about heart disease and stroke? Heart disease, heart attack, and stroke become more likely as you age. This may be due, in part, to the hormonal changes that your body experiences during menopause. These can affect how your body  processes dietary fats, triglycerides, and cholesterol. Heart attack and stroke are both medical emergencies. There are many things that you can do to help prevent heart disease and stroke:  Have your blood pressure checked at least every 1-2 years. High blood pressure causes heart disease and increases the risk of stroke.  If you are 28-69 years old, ask your health care provider if you should take aspirin to prevent a heart attack or a stroke.  Do not use any tobacco products, including cigarettes, chewing tobacco, or electronic cigarettes. If you need help quitting, ask your health care provider.  It is important to eat a healthy diet and maintain a healthy weight.  Be sure to include plenty of vegetables, fruits, low-fat dairy products, and lean protein.  Avoid eating foods that are high in solid fats, added sugars, or salt (sodium).  Get regular exercise. This is one of the most important things that you can do for your health.  Try to exercise for at least 150 minutes each week. The type of exercise that you do should increase your heart rate and make you sweat. This is known as moderate-intensity exercise.  Try to do strengthening exercises at least twice each week. Do these in addition to the moderate-intensity exercise.  Know your numbers.Ask your health care provider to check your cholesterol and your blood glucose. Continue to have your blood tested as directed by your health care provider. What should I know about cancer screening? There are several types of cancer. Take the following steps to reduce your risk and to catch any cancer development as early as possible. Breast Cancer  Practice breast self-awareness.  This means  understanding how your breasts normally appear and feel.  It also means doing regular breast self-exams. Let your health care provider know about any changes, no matter how small.  If you are 38 or older, have a clinician do a breast exam (clinical  breast exam or CBE) every year. Depending on your age, family history, and medical history, it may be recommended that you also have a yearly breast X-ray (mammogram).  If you have a family history of breast cancer, talk with your health care provider about genetic screening.  If you are at high risk for breast cancer, talk with your health care provider about having an MRI and a mammogram every year.  Breast cancer (BRCA) gene test is recommended for women who have family members with BRCA-related cancers. Results of the assessment will determine the need for genetic counseling and BRCA1 and for BRCA2 testing. BRCA-related cancers include these types:  Breast. This occurs in males or females.  Ovarian.  Tubal. This may also be called fallopian tube cancer.  Cancer of the abdominal or pelvic lining (peritoneal cancer).  Prostate.  Pancreatic. Cervical, Uterine, and Ovarian Cancer  Your health care provider may recommend that you be screened regularly for cancer of the pelvic organs. These include your ovaries, uterus, and vagina. This screening involves a pelvic exam, which includes checking for microscopic changes to the surface of your cervix (Pap test).  For women ages 21-65, health care providers may recommend a pelvic exam and a Pap test every three years. For women ages 28-65, they may recommend the Pap test and pelvic exam, combined with testing for human papilloma virus (HPV), every five years. Some types of HPV increase your risk of cervical cancer. Testing for HPV may also be done on women of any age who have unclear Pap test results.  Other health care providers may not recommend any screening for nonpregnant women who are considered low risk for pelvic cancer and have no symptoms. Ask your health care provider if a screening pelvic exam is right for you.  If you have had past treatment for cervical cancer or a condition that could lead to cancer, you need Pap tests and screening  for cancer for at least 20 years after your treatment. If Pap tests have been discontinued for you, your risk factors (such as having a new sexual partner) need to be reassessed to determine if you should start having screenings again. Some women have medical problems that increase the chance of getting cervical cancer. In these cases, your health care provider may recommend that you have screening and Pap tests more often.  If you have a family history of uterine cancer or ovarian cancer, talk with your health care provider about genetic screening.  If you have vaginal bleeding after reaching menopause, tell your health care provider.  There are currently no reliable tests available to screen for ovarian cancer. Lung Cancer  Lung cancer screening is recommended for adults 23-19 years old who are at high risk for lung cancer because of a history of smoking. A yearly low-dose CT scan of the lungs is recommended if you:  Currently smoke.  Have a history of at least 30 pack-years of smoking and you currently smoke or have quit within the past 15 years. A pack-year is smoking an average of one pack of cigarettes per day for one year. Yearly screening should:  Continue until it has been 15 years since you quit.  Stop if you develop a health problem  that would prevent you from having lung cancer treatment. Colorectal Cancer  This type of cancer can be detected and can often be prevented.  Routine colorectal cancer screening usually begins at age 27 and continues through age 46.  If you have risk factors for colon cancer, your health care provider may recommend that you be screened at an earlier age.  If you have a family history of colorectal cancer, talk with your health care provider about genetic screening.  Your health care provider may also recommend using home test kits to check for hidden blood in your stool.  A small camera at the end of a tube can be used to examine your colon  directly (sigmoidoscopy or colonoscopy). This is done to check for the earliest forms of colorectal cancer.  Direct examination of the colon should be repeated every 5-10 years until age 19. However, if early forms of precancerous polyps or small growths are found or if you have a family history or genetic risk for colorectal cancer, you may need to be screened more often. Skin Cancer  Check your skin from head to toe regularly.  Monitor any moles. Be sure to tell your health care provider:  About any new moles or changes in moles, especially if there is a change in a mole's shape or color.  If you have a mole that is larger than the size of a pencil eraser.  If any of your family members has a history of skin cancer, especially at a young age, talk with your health care provider about genetic screening.  Always use sunscreen. Apply sunscreen liberally and repeatedly throughout the day.  Whenever you are outside, protect yourself by wearing long sleeves, pants, a wide-brimmed hat, and sunglasses. What should I know about osteoporosis? Osteoporosis is a condition in which bone destruction happens more quickly than new bone creation. After menopause, you may be at an increased risk for osteoporosis. To help prevent osteoporosis or the bone fractures that can happen because of osteoporosis, the following is recommended:  If you are 83-60 years old, get at least 1,000 mg of calcium and at least 600 mg of vitamin D per day.  If you are older than age 75 but younger than age 59, get at least 1,200 mg of calcium and at least 600 mg of vitamin D per day.  If you are older than age 27, get at least 1,200 mg of calcium and at least 800 mg of vitamin D per day. Smoking and excessive alcohol intake increase the risk of osteoporosis. Eat foods that are rich in calcium and vitamin D, and do weight-bearing exercises several times each week as directed by your health care provider. What should I know  about how menopause affects my mental health? Depression may occur at any age, but it is more common as you become older. Common symptoms of depression include:  Low or sad mood.  Changes in sleep patterns.  Changes in appetite or eating patterns.  Feeling an overall lack of motivation or enjoyment of activities that you previously enjoyed.  Frequent crying spells. Talk with your health care provider if you think that you are experiencing depression. What should I know about immunizations? It is important that you get and maintain your immunizations. These include:  Tetanus, diphtheria, and pertussis (Tdap) booster vaccine.  Influenza every year before the flu season begins.  Pneumonia vaccine.  Shingles vaccine. Your health care provider may also recommend other immunizations. This information is not intended  to replace advice given to you by your health care provider. Make sure you discuss any questions you have with your health care provider. Document Released: 01/30/2006 Document Revised: 06/27/2016 Document Reviewed: 09/11/2015 Elsevier Interactive Patient Education  2017 [employer].
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
## 2017-03-26 ENCOUNTER — Other Ambulatory Visit: Payer: Self-pay | Admitting: Internal Medicine

## 2017-03-26 DIAGNOSIS — Z0000 Encounter for general adult medical examination without abnormal findings: Secondary | ICD-10-CM | POA: Insufficient documentation

## 2017-03-26 NOTE — Assessment & Plan Note (Signed)
Managed with hydrocortisone.  Asymptomatic and stable .

## 2017-03-26 NOTE — Assessment & Plan Note (Signed)
Annual comprehensive preventive exam was done as well as an evaluation and management of chronic conditions .  During the course of the visit the patient was educated and counseled about appropriate screening and preventive services including :  diabetes screening, lipid analysis with projected  10 year  risk for CAD , nutrition counseling, breast, cervical and colorectal cancer screening, and recommended immunizations.  Printed recommendations for health maintenance screenings was given 

## 2017-03-26 NOTE — Assessment & Plan Note (Addendum)
She has symptoms suggestive of stricture.resume prilosec daily.  Dg esophagus  And gi evaluation ordered.

## 2017-03-26 NOTE — Assessment & Plan Note (Signed)
Increasing estradiol dose today fore persistent symptos

## 2017-03-27 ENCOUNTER — Encounter: Payer: Self-pay | Admitting: Internal Medicine

## 2017-03-30 ENCOUNTER — Telehealth: Payer: Self-pay | Admitting: Internal Medicine

## 2017-03-30 NOTE — Telephone Encounter (Signed)
Pt called to follow up on getting the barium swallow done. Pt needs a referral to have that done with Radiology. Referral needed please and thank you!  Call pt @ [phone].

## 2017-03-30 NOTE — Telephone Encounter (Signed)
Please advise 

## 2017-03-31 ENCOUNTER — Telehealth: Payer: Self-pay | Admitting: Gastroenterology

## 2017-03-31 NOTE — Telephone Encounter (Signed)
Ready to schedule EGD Has her appt for barium swallow

## 2017-04-01 ENCOUNTER — Telehealth: Payer: Self-pay | Admitting: Gastroenterology

## 2017-04-01 ENCOUNTER — Other Ambulatory Visit: Payer: Self-pay

## 2017-04-01 DIAGNOSIS — R1319 Other dysphagia: Secondary | ICD-10-CM

## 2017-04-01 DIAGNOSIS — R1310 Dysphagia, unspecified: Secondary | ICD-10-CM

## 2017-04-01 NOTE — Telephone Encounter (Signed)
Pt scheduled for an EGD (92957) at [facility] with [place] on 04/10/17. Please precert for:  Esophageal Dysphagia R13.10

## 2017-04-01 NOTE — Telephone Encounter (Signed)
04/01/17 NO prior auth required per web site on EGD (73532) at [facility] with Dr. Allen Norris for R13.10 Esophageal Dysphagia for 04/10/17.

## 2017-04-03 ENCOUNTER — Telehealth: Payer: Self-pay | Admitting: Gastroenterology

## 2017-04-03 ENCOUNTER — Ambulatory Visit
Admission: RE | Admit: 2017-04-03 | Discharge: 2017-04-03 | Disposition: A | Discharge status: Home / Self Care | Payer: Medicare Other | Source: Ambulatory Visit | Attending: Internal Medicine | Admitting: Internal Medicine

## 2017-04-03 ENCOUNTER — Encounter: Payer: Self-pay | Admitting: Internal Medicine

## 2017-04-03 DIAGNOSIS — K222 Esophageal obstruction: Secondary | ICD-10-CM | POA: Insufficient documentation

## 2017-04-03 DIAGNOSIS — K219 Gastro-esophageal reflux disease without esophagitis: Secondary | ICD-10-CM | POA: Diagnosis present

## 2017-04-03 DIAGNOSIS — K210 Gastro-esophageal reflux disease with esophagitis: Secondary | ICD-10-CM | POA: Diagnosis not present

## 2017-04-03 NOTE — Telephone Encounter (Signed)
04/03/17 UHC website prior auth NOT required Decision ID# [id number] for EGD 77116 / K21.9, K22.2 Dr. Allen Norris at [facility]

## 2017-04-06 ENCOUNTER — Encounter: Payer: Self-pay | Admitting: *Deleted

## 2017-04-09 NOTE — Discharge Instructions (Signed)

## 2017-04-10 ENCOUNTER — Encounter
Admission: RE | Disposition: A | Discharge status: Home / Self Care | Payer: Self-pay | Source: Ambulatory Visit | Attending: Gastroenterology

## 2017-04-10 ENCOUNTER — Ambulatory Visit: Payer: Medicare Other | Admitting: Anesthesiology

## 2017-04-10 ENCOUNTER — Ambulatory Visit
Admission: RE | Admit: 2017-04-10 | Discharge: 2017-04-10 | Disposition: A | Discharge status: Home / Self Care | Payer: Medicare Other | Source: Ambulatory Visit | Attending: Gastroenterology | Admitting: Gastroenterology

## 2017-04-10 DIAGNOSIS — Z880 Allergy status to penicillin: Secondary | ICD-10-CM | POA: Insufficient documentation

## 2017-04-10 DIAGNOSIS — M19049 Primary osteoarthritis, unspecified hand: Secondary | ICD-10-CM | POA: Insufficient documentation

## 2017-04-10 DIAGNOSIS — K317 Polyp of stomach and duodenum: Secondary | ICD-10-CM | POA: Diagnosis not present

## 2017-04-10 DIAGNOSIS — Z86010 Personal history of colonic polyps: Secondary | ICD-10-CM | POA: Insufficient documentation

## 2017-04-10 DIAGNOSIS — K222 Esophageal obstruction: Secondary | ICD-10-CM | POA: Diagnosis not present

## 2017-04-10 DIAGNOSIS — E271 Primary adrenocortical insufficiency: Secondary | ICD-10-CM | POA: Diagnosis not present

## 2017-04-10 DIAGNOSIS — Z881 Allergy status to other antibiotic agents status: Secondary | ICD-10-CM | POA: Insufficient documentation

## 2017-04-10 DIAGNOSIS — Z79899 Other long term (current) drug therapy: Secondary | ICD-10-CM | POA: Diagnosis not present

## 2017-04-10 DIAGNOSIS — Z90710 Acquired absence of both cervix and uterus: Secondary | ICD-10-CM | POA: Insufficient documentation

## 2017-04-10 DIAGNOSIS — K2100 Gastro-esophageal reflux disease with esophagitis, without bleeding: Secondary | ICD-10-CM

## 2017-04-10 DIAGNOSIS — R1310 Dysphagia, unspecified: Secondary | ICD-10-CM

## 2017-04-10 DIAGNOSIS — Z888 Allergy status to other drugs, medicaments and biological substances status: Secondary | ICD-10-CM | POA: Diagnosis not present

## 2017-04-10 DIAGNOSIS — Z7982 Long term (current) use of aspirin: Secondary | ICD-10-CM | POA: Diagnosis not present

## 2017-04-10 DIAGNOSIS — Z803 Family history of malignant neoplasm of breast: Secondary | ICD-10-CM | POA: Insufficient documentation

## 2017-04-10 HISTORY — DX: Unspecified osteoarthritis, unspecified site: M19.90

## 2017-04-10 HISTORY — PX: ESOPHAGOGASTRODUODENOSCOPY (EGD) WITH PROPOFOL: SHX5813

## 2017-04-10 SURGERY — ESOPHAGOGASTRODUODENOSCOPY (EGD) WITH PROPOFOL
Anesthesia: Monitor Anesthesia Care | Wound class: Clean Contaminated

## 2017-04-10 MED ORDER — GLYCOPYRROLATE 0.2 MG/ML IJ SOLN
INTRAMUSCULAR | Status: DC | PRN
  Administered 2017-04-10: 0.2 mg via INTRAVENOUS

## 2017-04-10 MED ORDER — LIDOCAINE HCL (CARDIAC) 20 MG/ML IV SOLN
INTRAVENOUS | Status: DC | PRN
  Administered 2017-04-10: 50 mg via INTRAVENOUS

## 2017-04-10 MED ORDER — PROPOFOL 10 MG/ML IV BOLUS
INTRAVENOUS | Status: DC | PRN
  Administered 2017-04-10 (×2): 50 mg via INTRAVENOUS
  Administered 2017-04-10: 100 mg via INTRAVENOUS

## 2017-04-10 MED ORDER — LACTATED RINGERS IV SOLN
INTRAVENOUS | Status: DC
  Administered 2017-04-10: 08:00:00 via INTRAVENOUS

## 2017-04-10 SURGICAL SUPPLY — 32 items
BALLN DILATOR 10-12 8 (BALLOONS)
BALLN DILATOR 12-15 8 (BALLOONS)
BALLN DILATOR 15-18 8 (BALLOONS) ×2
BALLN DILATOR CRE 0-12 8 (BALLOONS)
BALLN DILATOR ESOPH 8 10 CRE (MISCELLANEOUS) IMPLANT
BALLOON DILATOR 12-15 8 (BALLOONS) IMPLANT
BALLOON DILATOR 15-18 8 (BALLOONS) ×1 IMPLANT
BALLOON DILATOR CRE 0-12 8 (BALLOONS) IMPLANT
BLOCK BITE 60FR ADLT L/F GRN (MISCELLANEOUS) ×2 IMPLANT
CANISTER SUCT 1200ML W/VALVE (MISCELLANEOUS) ×2 IMPLANT
CLIP HMST 235XBRD CATH ROT (MISCELLANEOUS) IMPLANT
CLIP RESOLUTION 360 11X235 (MISCELLANEOUS)
FCP ESCP3.2XJMB 240X2.8X (MISCELLANEOUS)
FORCEPS BIOP RAD 4 LRG CAP 4 (CUTTING FORCEPS) IMPLANT
FORCEPS BIOP RJ4 240 W/NDL (MISCELLANEOUS)
FORCEPS ESCP3.2XJMB 240X2.8X (MISCELLANEOUS) IMPLANT
GOWN CVR UNV OPN BCK APRN NK (MISCELLANEOUS) ×2 IMPLANT
GOWN ISOL THUMB LOOP REG UNIV (MISCELLANEOUS) ×2
INJECTOR VARIJECT VIN23 (MISCELLANEOUS) IMPLANT
KIT DEFENDO VALVE AND CONN (KITS) IMPLANT
KIT ENDO PROCEDURE OLY (KITS) ×2 IMPLANT
MARKER SPOT ENDO TATTOO 5ML (MISCELLANEOUS) IMPLANT
PAD GROUND ADULT SPLIT (MISCELLANEOUS) IMPLANT
RETRIEVER NET PLAT FOOD (MISCELLANEOUS) IMPLANT
SNARE SHORT THROW 13M SML OVAL (MISCELLANEOUS) IMPLANT
SNARE SHORT THROW 30M LRG OVAL (MISCELLANEOUS) IMPLANT
SPOT EX ENDOSCOPIC TATTOO (MISCELLANEOUS)
SYR INFLATION 60ML (SYRINGE) ×2 IMPLANT
TRAP ETRAP POLY (MISCELLANEOUS) IMPLANT
VARIJECT INJECTOR VIN23 (MISCELLANEOUS)
WATER STERILE IRR 250ML POUR (IV SOLUTION) ×2 IMPLANT
WIRE CRE 18-20MM 8CM F G (MISCELLANEOUS) IMPLANT

## 2017-04-10 NOTE — H&P (Signed)
Veronica Lame, MD [address] [place], [place] [address] Phone:[phone] Fax : [phone]  Primary Care Physician:  Crecencio Mc, MD Primary Gastroenterologist:  Dr. Allen Norris  Pre-Procedure History & Physical: HPI:  Veronica Ferrell is a 66 y.o. female is here for an endoscopy.   Past Medical History:  Diagnosis Date  . Addison's disease ([place])   . Arthritis    fingers  . Chicken pox   . Colon polyps   . Diverticulitis   . GERD (gastroesophageal reflux disease)     Past Surgical History:  Procedure Laterality Date  . ABDOMINAL HYSTERECTOMY    . APPENDECTOMY    . BREAST BIOPSY Right 2010  . BREAST SURGERY    . FOOT SURGERY Bilateral   . varicose veins      Prior to Admission medications   Medication Sig Start Date End Date Taking? Authorizing Provider  ALPRAZolam Duanne Moron) 1 MG tablet Take 1 tablet (1 mg total) by mouth at bedtime as needed for anxiety. For refill on or after Dec 07 2016 11/10/16  Yes Crecencio Mc, MD  fludrocortisone (FLORINEF) 0.1 MG tablet TAKE 1 TABLET BY MOUTH DAILY 02/02/17  Yes Crecencio Mc, MD  hydrocortisone (CORTEF) 10 MG tablet TAKE 2 TABLETS BY MOUTH EVERY MORNING AND 1 TABLET EVERY EVENING 07/29/16  Yes Crecencio Mc, MD  ipratropium (ATROVENT) 0.06 % nasal spray Place 2 sprays into both nostrils 4 (four) times daily. 03/18/16  Yes Crecencio Mc, MD  mirtazapine (REMERON) 30 MG tablet TAKE ONE TABLET AT BEDTIME 03/26/17  Yes Crecencio Mc, MD  polyethylene glycol powder (GLYCOLAX/MIRALAX) powder USE AS DIRECTED 05/30/16  Yes Crecencio Mc, MD  aspirin 81 MG tablet Take 81 mg by mouth once a week.     Historical Provider, MD  calcium citrate (CALCITRATE - DOSED IN MG ELEMENTAL CALCIUM) 950 MG tablet Take 1 tablet by mouth daily.    Historical Provider, MD  Cholecalciferol (VITAMIN D3) 1000 UNITS CAPS Take 1 capsule by mouth daily.    Historical Provider, MD  estradiol (ESTRACE) 1 MG tablet Take 1 tablet (1 mg total) by mouth  daily. 03/25/17   Crecencio Mc, MD  Multiple Vitamins-Minerals (MULTIVITAMIN WITH MINERALS) tablet Take 1 tablet by mouth daily.    Historical Provider, MD  omeprazole (PRILOSEC) 40 MG capsule Take 1 capsule (40 mg total) by mouth daily. 03/25/17   Crecencio Mc, MD  vitamin C (ASCORBIC ACID) 500 MG tablet Take 500 mg by mouth daily.    Historical Provider, MD    Allergies as of 04/01/2017 - Review Complete 03/25/2017  Allergen Reaction Noted  . Alendronate sodium Nausea Only 09/06/2015  . Boniva [ibandronic acid] Nausea And Vomiting 07/06/2013  . Clindamycin/lincomycin Dermatitis 07/06/2013  . Penicillins Rash 07/06/2013    Family History  Problem Relation Age of Onset  . Cancer Mother 56    Breast Ca  colon Ca (41)  and brain Ca (70)  . Breast cancer Mother   . Cancer Father   . Cancer Maternal Aunt     breast  . Breast cancer Maternal Aunt   . Cancer Maternal Grandmother     breast  . Breast cancer Maternal Grandmother   . Cancer Maternal Aunt     breast ca  . Breast cancer Maternal Aunt     Social History   Social History  . Marital status: Married    Spouse name: N/A  . Number of children:  N/A  . Years of education: N/A   Occupational History  . Not on file.   Social History Main Topics  . Smoking status: Never Smoker  . Smokeless tobacco: Never Used  . Alcohol use 4.2 oz/week    7 Glasses of wine per week     Comment: [place]  . Drug use: No  . Sexual activity: Yes   Other Topics Concern  . Not on file   Social History Narrative  . No narrative on file    Review of Systems: See HPI, otherwise negative ROS  Physical Exam: BP 102/67   Pulse 77   Temp 97.3 F (36.3 C) (Tympanic)   Resp 16   Ht 5\' 4"  (1.626 m)   Wt 122 lb (55.3 kg)   SpO2 100%   BMI 20.94 kg/m  General:   Alert,  pleasant and cooperative in NAD Head:  Normocephalic and atraumatic. Neck:  Supple; no masses or thyromegaly. Lungs:  Clear throughout to auscultation.      Heart:  Regular rate and rhythm. Abdomen:  Soft, nontender and nondistended. Normal bowel sounds, without guarding, and without rebound.   Neurologic:  Alert and  oriented x4;  grossly normal neurologically.  Impression/Plan: [name] is here for an endoscopy to be performed for dysphagia  Risks, benefits, limitations, and alternatives regarding  endoscopy have been reviewed with the patient.  Questions have been answered.  All parties agreeable.   Veronica Lame, MD  04/10/2017, 8:33 AM

## 2017-04-10 NOTE — Transfer of Care (Signed)
Immediate Anesthesia Transfer of Care Note  Patient: Veronica Ferrell  Procedure(s) Performed: Procedure(s) with comments: ESOPHAGOGASTRODUODENOSCOPY (EGD) WITH PROPOFOL (N/A) - requeests early as possible  Patient Location: PACU  Anesthesia Type: MAC  Level of Consciousness: awake, alert  and patient cooperative  Airway and Oxygen Therapy: Patient Spontanous Breathing and Patient connected to supplemental oxygen  Post-op Assessment: Post-op Vital signs reviewed, Patient's Cardiovascular Status Stable, Respiratory Function Stable, Patent Airway and No signs of Nausea or vomiting  Post-op Vital Signs: Reviewed and stable  Complications: No apparent anesthesia complications

## 2017-04-10 NOTE — Anesthesia Procedure Notes (Signed)
Procedures

## 2017-04-10 NOTE — Op Note (Signed)
[facility] Gastroenterology Patient Name: Veronica Ferrell Procedure Date: 04/10/2017 8:30 AM MRN: [id number] Account #: [id number] Date of Birth: [birth year] Admit Type: Outpatient Age: 66 Room: [facility] OR ROOM 01 Gender: Female Note Status: Finalized Procedure:            Upper GI endoscopy Indications:          Dysphagia Providers:            Lucilla Lame MD, MD Referring MD:         Deborra Medina, MD (Referring MD) Medicines:            Propofol per Anesthesia Complications:        No immediate complications. Procedure:            Pre-Anesthesia Assessment:                       - Prior to the procedure, a History and Physical was                        performed, and patient medications and allergies were                        reviewed. The patient's tolerance of previous                        anesthesia was also reviewed. The risks and benefits of                        the procedure and the sedation options and risks were                        discussed with the patient. All questions were                        answered, and informed consent was obtained. Prior                        Anticoagulants: The patient has taken no previous                        anticoagulant or antiplatelet agents. ASA Grade                        Assessment: II - A patient with mild systemic disease.                        After reviewing the risks and benefits, the patient was                        deemed in satisfactory condition to undergo the                        procedure.                       After obtaining informed consent, the endoscope was                        passed under direct vision. Throughout the procedure,  the patient's blood pressure, pulse, and oxygen                        saturations were monitored continuously. The Olympus                        GIF H180J Endoscope (O#:[id number]) was introduced through                        the  mouth, and advanced to the second part of duodenum.                        The upper GI endoscopy was accomplished without                        difficulty. The patient tolerated the procedure well. Findings:      One mild benign-appearing, intrinsic stenosis was found at the       gastroesophageal junction. And was traversed. A TTS dilator was passed       through the scope. Dilation with a 15-16.5-18 mm balloon dilator was       performed to 18 mm. The dilation site was examined following endoscope       reinsertion and showed complete resolution of luminal narrowing.      LA Grade B (one or more mucosal breaks greater than 5 mm, not extending       between the tops of two mucosal folds) esophagitis with no bleeding was       found at the gastroesophageal junction.      A few sessile polyps with no stigmata of recent bleeding were found in       the gastric body.      The examined duodenum was normal. Impression:           - Benign-appearing esophageal stenosis. Dilated.                       - LA Grade B reflux esophagitis.                       - A few gastric polyps.                       - Normal examined duodenum.                       - No specimens collected. Recommendation:       - Discharge patient to home.                       - Resume previous diet.                       - Use a proton pump inhibitor PO daily. Procedure Code(s):    --- Professional ---                       [phone], Esophagogastroduodenoscopy, flexible, transoral;                        with transendoscopic balloon dilation of esophagus                        (  less than 30 mm diameter) Diagnosis Code(s):    --- Professional ---                       R13.10, Dysphagia, unspecified                       K21.0, Gastro-esophageal reflux disease with esophagitis                       K31.7, Polyp of stomach and duodenum CPT copyright 2016 American Medical Association. All rights reserved. The codes documented  in this report are preliminary and upon coder review may  be revised to meet current compliance requirements. Lucilla Lame MD, MD 04/10/2017 8:50:56 AM This report has been signed electronically. Number of Addenda: 0 Note Initiated On: 04/10/2017 8:30 AM      [facility]

## 2017-04-10 NOTE — Anesthesia Preprocedure Evaluation (Signed)
Anesthesia Evaluation   Patient awake    Reviewed: Allergy & Precautions, NPO status   Airway Mallampati: I  TM Distance: >3 FB Neck ROM: Full    Dental  (+) Teeth Intact   Pulmonary neg pulmonary ROS,    breath sounds clear to auscultation       Cardiovascular negative cardio ROS   Rhythm:Regular Rate:Normal     Neuro/Psych negative neurological ROS     GI/Hepatic GERD  ,  Endo/Other  Addison's disease  Renal/GU      Musculoskeletal  (+) Arthritis ,   Abdominal   Peds  Hematology   Anesthesia Other Findings   Reproductive/Obstetrics                            Anesthesia Physical Anesthesia Plan  ASA: II  Anesthesia Plan: MAC   Post-op Pain Management:    Induction:   Airway Management Planned: Nasal Cannula  Additional Equipment:   Intra-op Plan:   Post-operative Plan:   Informed Consent: I have reviewed the patients History and Physical, chart, labs and discussed the procedure including the risks, benefits and alternatives for the proposed anesthesia with the patient or authorized representative who has indicated his/her understanding and acceptance.     Plan Discussed with: CRNA  Anesthesia Plan Comments:         Anesthesia Quick Evaluation

## 2017-04-10 NOTE — Anesthesia Procedure Notes (Signed)
Procedure Name: MAC Performed by: Marieclaire Bettenhausen Pre-anesthesia Checklist: Patient identified, Emergency Drugs available, Suction available, Timeout performed and Patient being monitored Patient Re-evaluated:Patient Re-evaluated prior to inductionOxygen Delivery Method: Nasal cannula Placement Confirmation: positive ETCO2     

## 2017-04-10 NOTE — Anesthesia Postprocedure Evaluation (Signed)
Anesthesia Post Note  Patient: Veronica Ferrell  Procedure(s) Performed: Procedure(s) (LRB): ESOPHAGOGASTRODUODENOSCOPY (EGD) WITH PROPOFOL (N/A)  Patient location during evaluation: PACU Anesthesia Type: MAC Level of consciousness: awake and alert Pain management: pain level controlled Vital Signs Assessment: post-procedure vital signs reviewed and stable Respiratory status: spontaneous breathing, nonlabored ventilation and respiratory function stable Cardiovascular status: blood pressure returned to baseline and stable Postop Assessment: no signs of nausea or vomiting Anesthetic complications: no    Veda Canning

## 2017-04-13 ENCOUNTER — Encounter: Payer: Self-pay | Admitting: Gastroenterology

## 2017-04-13 ENCOUNTER — Other Ambulatory Visit: Payer: Self-pay | Admitting: Internal Medicine

## 2017-04-13 ENCOUNTER — Encounter: Payer: Self-pay | Admitting: Internal Medicine

## 2017-05-27 ENCOUNTER — Other Ambulatory Visit: Payer: Self-pay | Admitting: Internal Medicine

## 2017-07-27 ENCOUNTER — Other Ambulatory Visit: Payer: Self-pay | Admitting: Internal Medicine

## 2017-07-27 DIAGNOSIS — K222 Esophageal obstruction: Principal | ICD-10-CM

## 2017-07-27 DIAGNOSIS — K219 Gastro-esophageal reflux disease without esophagitis: Secondary | ICD-10-CM

## 2017-07-29 ENCOUNTER — Other Ambulatory Visit: Payer: Self-pay | Admitting: Internal Medicine

## 2017-08-04 ENCOUNTER — Telehealth: Payer: Self-pay | Admitting: Internal Medicine

## 2017-08-04 NOTE — Telephone Encounter (Signed)
Pt declined AWV. °

## 2017-08-26 ENCOUNTER — Other Ambulatory Visit: Payer: Self-pay | Admitting: Internal Medicine

## 2017-08-26 DIAGNOSIS — K219 Gastro-esophageal reflux disease without esophagitis: Secondary | ICD-10-CM

## 2017-08-26 DIAGNOSIS — K222 Esophageal obstruction: Principal | ICD-10-CM

## 2017-09-16 NOTE — Telephone Encounter (Signed)
Error

## 2017-09-16 NOTE — Telephone Encounter (Signed)
error 

## 2017-09-30 ENCOUNTER — Other Ambulatory Visit: Payer: Self-pay | Admitting: Internal Medicine

## 2017-10-01 ENCOUNTER — Other Ambulatory Visit: Payer: Self-pay | Admitting: Internal Medicine

## 2017-10-02 ENCOUNTER — Other Ambulatory Visit: Payer: Self-pay

## 2017-10-02 ENCOUNTER — Telehealth: Payer: Self-pay | Admitting: Internal Medicine

## 2017-10-02 ENCOUNTER — Other Ambulatory Visit: Payer: Self-pay | Admitting: Internal Medicine

## 2017-10-02 MED ORDER — MIRTAZAPINE 30 MG PO TABS: 30.0000 mg | ORAL_TABLET | Freq: Every day | ORAL | 1 refills | Status: DC

## 2017-10-02 MED ORDER — MIRTAZAPINE 30 MG PO TABS: 30.0000 mg | ORAL_TABLET | Freq: Every day | ORAL | 3 refills | Status: DC

## 2017-10-02 NOTE — Telephone Encounter (Signed)
remeron faxed to [place] since [place] phones are down . My chart message sent

## 2017-10-02 NOTE — Telephone Encounter (Signed)
Please advise for refill, thanks 

## 2017-10-02 NOTE — Telephone Encounter (Signed)
Printed, signed and faxed.  

## 2017-10-05 ENCOUNTER — Other Ambulatory Visit: Payer: Self-pay | Admitting: Internal Medicine

## 2017-10-05 MED ORDER — MIRTAZAPINE 30 MG PO TABS: 30.0000 mg | ORAL_TABLET | Freq: Every day | ORAL | 1 refills | Status: DC

## 2017-10-05 MED ORDER — MIRTAZAPINE 30 MG PO TABS: 30.0000 mg | ORAL_TABLET | Freq: Every day | ORAL | 3 refills | Status: DC

## 2017-10-05 NOTE — Addendum Note (Signed)
Addended by: Bevelyn Ngo on: 10/05/2017 03:38 PM   Modules accepted: Orders

## 2017-10-05 NOTE — Addendum Note (Signed)
Addended by: Nanci Pina on: 10/05/2017 03:07 PM   Modules accepted: Orders

## 2017-10-06 ENCOUNTER — Other Ambulatory Visit: Payer: Self-pay | Admitting: Internal Medicine

## 2017-11-02 ENCOUNTER — Other Ambulatory Visit: Payer: Self-pay | Admitting: Internal Medicine

## 2017-11-02 DIAGNOSIS — K219 Gastro-esophageal reflux disease without esophagitis: Secondary | ICD-10-CM

## 2017-11-02 DIAGNOSIS — K222 Esophageal obstruction: Principal | ICD-10-CM

## 2017-12-01 ENCOUNTER — Other Ambulatory Visit: Payer: Self-pay | Admitting: Internal Medicine

## 2017-12-23 ENCOUNTER — Other Ambulatory Visit: Payer: Self-pay | Admitting: Internal Medicine

## 2017-12-23 DIAGNOSIS — Z1231 Encounter for screening mammogram for malignant neoplasm of breast: Secondary | ICD-10-CM

## 2017-12-28 ENCOUNTER — Other Ambulatory Visit: Payer: Self-pay | Admitting: Internal Medicine

## 2018-02-02 ENCOUNTER — Encounter: Payer: Self-pay | Admitting: Internal Medicine

## 2018-02-02 DIAGNOSIS — K222 Esophageal obstruction: Principal | ICD-10-CM

## 2018-02-02 DIAGNOSIS — K219 Gastro-esophageal reflux disease without esophagitis: Secondary | ICD-10-CM

## 2018-02-03 ENCOUNTER — Ambulatory Visit
Admission: RE | Admit: 2018-02-03 | Discharge: 2018-02-03 | Disposition: A | Discharge status: Home / Self Care | Payer: Medicare Other | Source: Ambulatory Visit | Attending: Internal Medicine | Admitting: Internal Medicine

## 2018-02-03 DIAGNOSIS — Z1231 Encounter for screening mammogram for malignant neoplasm of breast: Secondary | ICD-10-CM | POA: Insufficient documentation

## 2018-02-03 HISTORY — DX: Malignant (primary) neoplasm, unspecified: C80.1

## 2018-02-04 MED ORDER — FLUDROCORTISONE ACETATE 0.1 MG PO TABS: 100.0000 ug | ORAL_TABLET | Freq: Every day | ORAL | 1 refills | Status: DC

## 2018-02-04 MED ORDER — OMEPRAZOLE 40 MG PO CPDR: 40.0000 mg | DELAYED_RELEASE_CAPSULE | Freq: Every day | ORAL | 1 refills | Status: DC

## 2018-02-04 MED ORDER — ESTRADIOL 1 MG PO TABS: 1.0000 mg | ORAL_TABLET | Freq: Every day | ORAL | 2 refills | Status: DC

## 2018-02-04 MED ORDER — POLYETHYLENE GLYCOL 3350 17 GM/SCOOP PO POWD: ORAL | 1 refills | Status: DC

## 2018-02-04 MED ORDER — MIRTAZAPINE 30 MG PO TABS: 30.0000 mg | ORAL_TABLET | Freq: Every day | ORAL | 1 refills | Status: DC

## 2018-02-04 MED ORDER — IPRATROPIUM BROMIDE 0.06 % NA SOLN: NASAL | 2 refills | Status: DC

## 2018-02-04 MED ORDER — HYDROCORTISONE 10 MG PO TABS: ORAL_TABLET | ORAL | 1 refills | Status: DC

## 2018-02-05 ENCOUNTER — Encounter: Payer: Self-pay | Admitting: Internal Medicine

## 2018-02-17 ENCOUNTER — Telehealth: Payer: Self-pay

## 2018-02-17 NOTE — Telephone Encounter (Signed)
PA has been submitted on covermymeds for estradiol.

## 2018-02-19 NOTE — Telephone Encounter (Signed)
LMTCB. Need to let pt know that her insurance has denied coverage of her estradiol prescription. [facility] may speak with pt.

## 2018-03-22 ENCOUNTER — Telehealth: Payer: Self-pay | Admitting: Radiology

## 2018-03-22 DIAGNOSIS — E559 Vitamin D deficiency, unspecified: Secondary | ICD-10-CM

## 2018-03-22 DIAGNOSIS — E271 Primary adrenocortical insufficiency: Secondary | ICD-10-CM

## 2018-03-22 DIAGNOSIS — E782 Mixed hyperlipidemia: Secondary | ICD-10-CM

## 2018-03-22 NOTE — Telephone Encounter (Signed)
Pt coming in for labs tomorrow, please place future orders. Thank you.  

## 2018-03-22 NOTE — Telephone Encounter (Signed)
Labs ordered.

## 2018-03-22 NOTE — Addendum Note (Signed)
Addended by: Crecencio Mc on: 03/22/2018 12:45 PM   Modules accepted: Orders

## 2018-03-23 ENCOUNTER — Other Ambulatory Visit (INDEPENDENT_AMBULATORY_CARE_PROVIDER_SITE_OTHER): Payer: Medicare Other

## 2018-03-23 DIAGNOSIS — E782 Mixed hyperlipidemia: Secondary | ICD-10-CM | POA: Diagnosis not present

## 2018-03-23 DIAGNOSIS — E559 Vitamin D deficiency, unspecified: Secondary | ICD-10-CM

## 2018-03-23 DIAGNOSIS — E271 Primary adrenocortical insufficiency: Secondary | ICD-10-CM

## 2018-03-23 LAB — CBC WITH DIFFERENTIAL/PLATELET
Basophils Absolute: 0.1 10*3/uL (ref 0.0–0.1)
Basophils Relative: 0.7 % (ref 0.0–3.0)
EOS PCT: 1.5 % (ref 0.0–5.0)
Eosinophils Absolute: 0.1 10*3/uL (ref 0.0–0.7)
HEMATOCRIT: 42.1 % (ref 36.0–46.0)
HEMOGLOBIN: 14 g/dL (ref 12.0–15.0)
Lymphocytes Relative: 57.2 % — ABNORMAL HIGH (ref 12.0–46.0)
Lymphs Abs: 4.3 10*3/uL — ABNORMAL HIGH (ref 0.7–4.0)
MCHC: 33.3 g/dL (ref 30.0–36.0)
MCV: 98.3 fl (ref 78.0–100.0)
MONO ABS: 0.6 10*3/uL (ref 0.1–1.0)
MONOS PCT: 8.3 % (ref 3.0–12.0)
Neutro Abs: 2.5 10*3/uL (ref 1.4–7.7)
Neutrophils Relative %: 32.3 % — ABNORMAL LOW (ref 43.0–77.0)
Platelets: 351 10*3/uL (ref 150.0–400.0)
RBC: 4.28 Mil/uL (ref 3.87–5.11)
RDW: 13.3 % (ref 11.5–15.5)
WBC: 7.6 10*3/uL (ref 4.0–10.5)

## 2018-03-23 LAB — COMPREHENSIVE METABOLIC PANEL
ALBUMIN: 4.2 g/dL (ref 3.5–5.2)
ALT: 14 U/L (ref 0–35)
AST: 18 U/L (ref 0–37)
Alkaline Phosphatase: 26 U/L — ABNORMAL LOW (ref 39–117)
BUN: 23 mg/dL (ref 6–23)
CALCIUM: 9.8 mg/dL (ref 8.4–10.5)
CHLORIDE: 97 meq/L (ref 96–112)
CO2: 29 meq/L (ref 19–32)
Creatinine, Ser: 0.78 mg/dL (ref 0.40–1.20)
GFR: 77.85 mL/min (ref >60.00)
Glucose, Bld: 89 mg/dL (ref 70–99)
POTASSIUM: 4.6 meq/L (ref 3.5–5.1)
Sodium: 131 mEq/L — ABNORMAL LOW (ref 135–145)
Total Bilirubin: 0.6 mg/dL (ref 0.2–1.2)
Total Protein: 6.9 g/dL (ref 6.0–8.3)

## 2018-03-23 LAB — LIPID PANEL
CHOL/HDL RATIO: 2
CHOLESTEROL: 206 mg/dL — AB (ref 0–200)
HDL: 93 mg/dL (ref >39.00)
LDL CALC: 98 mg/dL (ref 0–99)
NonHDL: 113.17
TRIGLYCERIDES: 76 mg/dL (ref 0.0–149.0)
VLDL: 15.2 mg/dL (ref 0.0–40.0)

## 2018-03-23 LAB — VITAMIN D 25 HYDROXY (VIT D DEFICIENCY, FRACTURES): VITD: 58.97 ng/mL (ref 30.00–100.00)

## 2018-03-23 LAB — TSH: TSH: 4.71 u[IU]/mL — ABNORMAL HIGH (ref 0.35–4.50)

## 2018-03-30 ENCOUNTER — Encounter: Payer: Self-pay | Admitting: Internal Medicine

## 2018-03-30 ENCOUNTER — Other Ambulatory Visit: Payer: Self-pay

## 2018-03-30 ENCOUNTER — Other Ambulatory Visit: Payer: Self-pay | Admitting: Internal Medicine

## 2018-03-30 ENCOUNTER — Ambulatory Visit (INDEPENDENT_AMBULATORY_CARE_PROVIDER_SITE_OTHER): Payer: Medicare Other | Admitting: Internal Medicine

## 2018-03-30 VITALS — BP 102/60 | HR 90 | Temp 97.9°F | Wt 126.4 lb

## 2018-03-30 DIAGNOSIS — E063 Autoimmune thyroiditis: Secondary | ICD-10-CM

## 2018-03-30 DIAGNOSIS — R7989 Other specified abnormal findings of blood chemistry: Secondary | ICD-10-CM | POA: Diagnosis not present

## 2018-03-30 DIAGNOSIS — Z780 Asymptomatic menopausal state: Secondary | ICD-10-CM | POA: Diagnosis not present

## 2018-03-30 DIAGNOSIS — Z0000 Encounter for general adult medical examination without abnormal findings: Secondary | ICD-10-CM | POA: Diagnosis not present

## 2018-03-30 DIAGNOSIS — Z23 Encounter for immunization: Secondary | ICD-10-CM

## 2018-03-30 DIAGNOSIS — F5104 Psychophysiologic insomnia: Secondary | ICD-10-CM

## 2018-03-30 MED ORDER — LEVOFLOXACIN 500 MG PO TABS: 500.0000 mg | ORAL_TABLET | Freq: Every day | ORAL | 0 refills | Status: DC

## 2018-03-30 MED ORDER — ALPRAZOLAM 1 MG PO TABS: 1.0000 mg | ORAL_TABLET | Freq: Every evening | ORAL | 5 refills | Status: DC | PRN

## 2018-03-30 MED ORDER — PROMETHAZINE HCL 12.5 MG PO TABS: 12.5000 mg | ORAL_TABLET | Freq: Four times a day (QID) | ORAL | 0 refills | Status: DC | PRN

## 2018-03-30 MED ORDER — ESTRADIOL 1 MG PO TABS: 1.0000 mg | ORAL_TABLET | Freq: Every day | ORAL | 3 refills | Status: DC

## 2018-03-30 MED ORDER — ZOSTER VAC RECOMB ADJUVANTED 50 MCG/0.5ML IM SUSR: 0.5000 mL | Freq: Once | INTRAMUSCULAR | 1 refills | Status: AC

## 2018-03-30 NOTE — Progress Notes (Signed)
Patient ID: Veronica Ferrell, female    DOB: [birth year]  Age: 67 y.o. MRN: [id number]  The patient is here for annual preventive  examination and management of other chronic and acute problems.  Mammogram Feb 2019 annual Colonoscopy by Veronica Ferrell  2015 NO COPY IN CHART .  confirmed by patient  Last PAP normal  5 YEARS AGO  The risk factors are reflected in the social history.  The roster of all physicians providing medical care to patient - is listed in the Snapshot section of the chart.  Activities of daily living:  The patient is 100% independent in all ADLs: dressing, toileting, feeding as well as independent mobility  Home safety : The patient has smoke detectors in the home. They wear seatbelts.  There are no firearms at home. There is no violence in the home.   There is no risks for hepatitis, STDs or HIV. There is no   history of blood transfusion. They have no travel history to infectious disease endemic areas of the world.  The patient has seen their dentist in the last six month. They have seen their eye doctor in the last year. They admit to slight hearing difficulty with regard to whispered voices and some television programs.  They have deferred audiologic testing in the last year.  They do not  have excessive sun exposure. Discussed the need for sun protection: hats, long sleeves and use of sunscreen if there is significant sun exposure.   Diet: the importance of a healthy diet is discussed. They do have a healthy diet.  The benefits of regular aerobic exercise were discussed. She walks 4 times per week ,  20 minutes.   Depression screen: there are no signs or vegative symptoms of depression- irritability, change in appetite, anhedonia, sadness/tearfullness.  Cognitive assessment: the patient manages all their financial and personal affairs and is actively engaged. They could relate day,date,year and events; recalled 2/3 objects at 3 minutes; performed clock-face test normally.  The  following portions of the patient's history were reviewed and updated as appropriate: allergies, current medications, past family history, past medical history,  past surgical history, past social history  and problem list.  Visual acuity was not assessed per patient preference since she has regular follow up with her ophthalmologist. Hearing and body mass index were assessed and reviewed.   During the course of the visit the patient was educated and counseled about appropriate screening and preventive services including : fall prevention , diabetes screening, nutrition counseling, colorectal cancer screening, and recommended immunizations.    CC: The primary encounter diagnosis was Abnormal TSH. Diagnoses of Need for 23-polyvalent pneumococcal polysaccharide vaccine, Encounter for preventive health examination, Acquired autoimmune hypothyroidism, Chronic insomnia, and Menopause were also pertinent to this visit.  Bowels not moving regularly,  Alternates between constipation and soft stools. Uses citrucel and miralax on a regular basis.  Fasting labs reviewed :  Thyroid slightly underactive Chronic mild hyponatremia ,  potassium normal      Insomnia occurs when travelling and after going out.  Uses alprazolam prn not daily  History Veronica Ferrell has a past medical history of Addison's disease (Veronica Ferrell), Arthritis, Cancer (Veronica Ferrell), Chicken pox, Colon polyps, Diverticulitis, and GERD (gastroesophageal reflux disease).   She has a past surgical history that includes varicose veins; Breast surgery; Appendectomy; Abdominal hysterectomy; Foot surgery (Bilateral); Esophagogastroduodenoscopy (egd) with propofol (N/A, 04/10/2017); and Breast biopsy (Right, 2010).   Her family history includes Breast cancer in her maternal aunt, maternal aunt, and maternal  grandmother; Breast cancer (age of onset: 7) in her mother; Cancer in her father, maternal aunt, maternal aunt, and maternal grandmother; Cancer (age of onset: 44) in  her mother.She reports that she has never smoked. She has never used smokeless tobacco. She reports that she drinks about 4.2 oz of alcohol per week. She reports that she does not use drugs.  Outpatient Medications Prior to Visit  Medication Sig Dispense Refill  . aspirin 81 MG tablet Take 81 mg by mouth once a week.     . calcium citrate (CALCITRATE - DOSED IN MG ELEMENTAL CALCIUM) 950 MG tablet Take 1 tablet by mouth daily.    . Cholecalciferol (VITAMIN D3) 1000 UNITS CAPS Take 1 capsule by mouth daily.    . fludrocortisone (FLORINEF) 0.1 MG tablet Take 1 tablet (100 mcg total) by mouth daily. 90 tablet 1  . hydrocortisone (CORTEF) 10 MG tablet TAKE 2 TABLETS BY MOUTH EVERY MORNING AND 1 TABLET BY MOUTH IN THE EVENING AS DIRECTED BY PHYSICIAN. 270 tablet 1  . ipratropium (ATROVENT) 0.06 % nasal spray USE 2 SPRAYS IN EACH NOSTRIL 4 TIMES DAILY 15 mL 2  . mirtazapine (REMERON) 30 MG tablet Take 1 tablet (30 mg total) by mouth at bedtime. 90 tablet 1  . Multiple Vitamins-Minerals (MULTIVITAMIN WITH MINERALS) tablet Take 1 tablet by mouth daily.    Marland Kitchen omeprazole (PRILOSEC) 40 MG capsule Take 1 capsule (40 mg total) by mouth daily. 90 capsule 1  . polyethylene glycol powder (GLYCOLAX/MIRALAX) powder USE AS DIRECTED 527 g 1  . vitamin C (ASCORBIC ACID) 500 MG tablet Take 500 mg by mouth daily.    Marland Kitchen ALPRAZolam (XANAX) 1 MG tablet Take 1 tablet (1 mg total) by mouth at bedtime as needed for anxiety. For refill on or after Dec 07 2016 30 tablet 4  . estradiol (ESTRACE) 1 MG tablet Take 1 tablet (1 mg total) by mouth daily. 30 tablet 2  . mirtazapine (REMERON) 30 MG tablet TAKE 1 TABLET BY MOUTH AT BEDTIME (Patient not taking: Reported on 03/30/2018) 90 tablet 1  . mirtazapine (REMERON) 30 MG tablet Take 1 tablet (30 mg total) by mouth at bedtime. (Patient not taking: Reported on 03/30/2018) 90 tablet 1   No facility-administered medications prior to visit.     Review of Systems   Patient denies  headache, fevers, malaise, unintentional weight loss, skin rash, eye pain, sinus congestion and sinus pain, sore throat, dysphagia,  hemoptysis , cough, dyspnea, wheezing, chest pain, palpitations, orthopnea, edema, abdominal pain, nausea, melena, diarrhea, constipation, flank pain, dysuria, hematuria, urinary  Frequency, nocturia, numbness, tingling, seizures,  Focal weakness, Loss of consciousness,  Tremor, insomnia, depression, anxiety, and suicidal ideation.     Objective:  BP 102/60 (BP Location: Left Arm, Patient Position: Sitting, Cuff Size: Normal)   Pulse 90   Temp 97.9 F (36.6 C)   Wt 126 lb 6.4 oz (57.3 kg)   SpO2 97%   BMI 21.70 kg/m   Physical Exam  General appearance: alert, cooperative and appears stated age Ears: normal TM's and external ear canals both ears Throat: lips, mucosa, and tongue normal; teeth and gums normal Neck: no adenopathy, no carotid bruit, supple, symmetrical, trachea midline and thyroid not enlarged, symmetric, no tenderness/mass/nodules Back: symmetric, no curvature. ROM normal. No CVA tenderness. Lungs: clear to auscultation bilaterally Heart: regular rate and rhythm, S1, S2 normal, no murmur, click, rub or gallop Abdomen: soft, non-tender; bowel sounds normal; no masses,  no organomegaly Pulses: 2+ and symmetric  Skin: Skin color, texture, turgor normal. No rashes or lesions Lymph nodes: Cervical, supraclavicular, and axillary nodes normal.   Assessment & Plan:   Problem List Items Addressed This Visit    Menopause    She remains symptomatic without HRT and is willing to continue Estradiol out of pocket      Encounter for preventive health examination    Annual comprehensive preventive exam was done as well as an evaluation and management of chronic conditions .  During the course of the visit the patient was educated and counseled about appropriate screening and preventive services including :  diabetes screening, lipid analysis with  projected  10 year  risk for CAD , nutrition counseling, breast, cervical and colorectal cancer screening, and recommended immunizations.  Printed recommendations for health maintenance screenings was give      Chronic insomnia    No longer taking remeron .  Alprazolam prn use discussed.       Acquired autoimmune hypothyroidism    tsh was slightly elevated,  Will repeat in 6 weekw.        Other Visit Diagnoses    Abnormal TSH    -  Primary   Relevant Orders   TSH   T4, free   Need for 23-polyvalent pneumococcal polysaccharide vaccine       Relevant Orders   Pneumococcal polysaccharide vaccine 23-valent greater than or equal to 2yo subcutaneous/IM (Completed)      I am having Doreene Nest start on Zoster Vaccine Adjuvanted. I am also having her maintain her aspirin, Vitamin D3, calcium citrate, multivitamin with minerals, vitamin C, mirtazapine, mirtazapine, hydrocortisone, fludrocortisone, mirtazapine, polyethylene glycol powder, ipratropium, omeprazole, ALPRAZolam, promethazine, levofloxacin, and estradiol.  Meds ordered this encounter  Medications  . ALPRAZolam (XANAX) 1 MG tablet    Sig: Take 1 tablet (1 mg total) by mouth at bedtime as needed for anxiety. For refill on or after Dec 07 2016    Dispense:  30 tablet    Refill:  5  . promethazine (PHENERGAN) 12.5 MG tablet    Sig: Take 1 tablet (12.5 mg total) by mouth every 6 (six) hours as needed for nausea or vomiting.    Dispense:  30 tablet    Refill:  0  . levofloxacin (LEVAQUIN) 500 MG tablet    Sig: Take 1 tablet (500 mg total) by mouth daily.    Dispense:  7 tablet    Refill:  0  . estradiol (ESTRACE) 1 MG tablet    Sig: Take 1 tablet (1 mg total) by mouth daily.    Dispense:  90 tablet    Refill:  3  . Zoster Vaccine Adjuvanted [facility]) injection    Sig: Inject 0.5 mLs into the muscle once for 1 dose.    Dispense:  1 each    Refill:  1    Medications Discontinued During This Encounter  Medication  Reason  . ALPRAZolam (XANAX) 1 MG tablet Reorder  . estradiol (ESTRACE) 1 MG tablet Reorder    Follow-up: No follow-ups on file.   Crecencio Mc, MD

## 2018-03-30 NOTE — Patient Instructions (Addendum)
[facility]'s for levaquin and phenergan [facility] to [place]   Reduce dose of alprazolam to 1/2 tablet if  You are using phenergan at night.   Take a probiotic if you use the levaquin  To prevent c dif colitis  Flush your sinuses after flying (Nettie Rickey Primus Meds sinus rinse, etc ) to prevent infections from public travel   You received the Pneumonia vaccine today (your final pneumonia vaccine)  We will recheck your thyroid function in 6 weeks  Your other labs are excellent!   The ShingRx vaccine is now available in local pharmacies and is much more protective thant Zostavaxs,  It is therefore ADVISED for all interested adults over 50 to prevent shingles   I have given you an rx for it   Health Maintenance for Postmenopausal Women Menopause is a normal process in which your reproductive ability comes to an end. This process happens gradually over a span of months to years, usually between the ages of 45 and 51. Menopause is complete when you have missed 12 consecutive menstrual periods. It is important to talk with your health care provider about some of the most common conditions that affect postmenopausal women, such as heart disease, cancer, and bone loss (osteoporosis). Adopting a healthy lifestyle and getting preventive care can help to promote your health and wellness. Those actions can also lower your chances of developing some of these common conditions. What should I know about menopause? During menopause, you may experience a number of symptoms, such as:  Moderate-to-severe hot flashes.  Night sweats.  Decrease in sex drive.  Mood swings.  Headaches.  Tiredness.  Irritability.  Memory problems.  Insomnia.  Choosing to treat or not to treat menopausal changes is an individual decision that you make with your health care provider. What should I know about hormone replacement therapy and supplements? Hormone therapy products are  effective for treating symptoms that are associated with menopause, such as hot flashes and night sweats. Hormone replacement carries certain risks, especially as you become older. If you are thinking about using estrogen or estrogen with progestin treatments, discuss the benefits and risks with your health care provider. What should I know about heart disease and stroke? Heart disease, heart attack, and stroke become more likely as you age. This may be due, in part, to the hormonal changes that your body experiences during menopause. These can affect how your body processes dietary fats, triglycerides, and cholesterol. Heart attack and stroke are both medical emergencies. There are many things that you can do to help prevent heart disease and stroke:  Have your blood pressure checked at least every 1-2 years. High blood pressure causes heart disease and increases the risk of stroke.  If you are 65-88 years old, ask your health care provider if you should take aspirin to prevent a heart attack or a stroke.  Do not use any tobacco products, including cigarettes, chewing tobacco, or electronic cigarettes. If you need help quitting, ask your health care provider.  It is important to eat a healthy diet and maintain a healthy weight. ? Be sure to include plenty of vegetables, fruits, low-fat dairy products, and lean protein. ? Avoid eating foods that are high in solid fats, added sugars, or salt (sodium).  Get regular exercise. This is one of the most important things that you can do for your health. ? Try to exercise for at least 150 minutes each week. The type of  exercise that you do should increase your heart rate and make you sweat. This is known as moderate-intensity exercise. ? Try to do strengthening exercises at least twice each week. Do these in addition to the moderate-intensity exercise.  Know your numbers.Ask your health care provider to check your cholesterol and your blood glucose.  Continue to have your blood tested as directed by your health care provider.  What should I know about cancer screening? There are several types of cancer. Take the following steps to reduce your risk and to catch any cancer development as early as possible. Breast Cancer  Practice breast self-awareness. ? This means understanding how your breasts normally appear and feel. ? It also means doing regular breast self-exams. Let your health care provider know about any changes, no matter how small.  If you are 61 or older, have a clinician do a breast exam (clinical breast exam or CBE) every year. Depending on your age, family history, and medical history, it may be recommended that you also have a yearly breast X-ray (mammogram).  If you have a family history of breast cancer, talk with your health care provider about genetic screening.  If you are at high risk for breast cancer, talk with your health care provider about having an MRI and a mammogram every year.  Breast cancer (BRCA) gene test is recommended for women who have family members with BRCA-related cancers. Results of the assessment will determine the need for genetic counseling and BRCA1 and for BRCA2 testing. BRCA-related cancers include these types: ? Breast. This occurs in males or females. ? Ovarian. ? Tubal. This may also be called fallopian tube cancer. ? Cancer of the abdominal or pelvic lining (peritoneal cancer). ? Prostate. ? Pancreatic.  Cervical, Uterine, and Ovarian Cancer Your health care provider may recommend that you be screened regularly for cancer of the pelvic organs. These include your ovaries, uterus, and vagina. This screening involves a pelvic exam, which includes checking for microscopic changes to the surface of your cervix (Pap test).  For women ages 21-65, health care providers may recommend a pelvic exam and a Pap test every three years. For women ages 73-65, they may recommend the Pap test and pelvic  exam, combined with testing for human papilloma virus (HPV), every five years. Some types of HPV increase your risk of cervical cancer. Testing for HPV may also be done on women of any age who have unclear Pap test results.  Other health care providers may not recommend any screening for nonpregnant women who are considered low risk for pelvic cancer and have no symptoms. Ask your health care provider if a screening pelvic exam is right for you.  If you have had past treatment for cervical cancer or a condition that could lead to cancer, you need Pap tests and screening for cancer for at least 20 years after your treatment. If Pap tests have been discontinued for you, your risk factors (such as having a new sexual partner) need to be reassessed to determine if you should start having screenings again. Some women have medical problems that increase the chance of getting cervical cancer. In these cases, your health care provider may recommend that you have screening and Pap tests more often.  If you have a family history of uterine cancer or ovarian cancer, talk with your health care provider about genetic screening.  If you have vaginal bleeding after reaching menopause, tell your health care provider.  There are currently no reliable tests available  to screen for ovarian cancer.  Lung Cancer Lung cancer screening is recommended for adults 41-49 years old who are at high risk for lung cancer because of a history of smoking. A yearly low-dose CT scan of the lungs is recommended if you:  Currently smoke.  Have a history of at least 30 pack-years of smoking and you currently smoke or have quit within the past 15 years. A pack-year is smoking an average of one pack of cigarettes per day for one year.  Yearly screening should:  Continue until it has been 15 years since you quit.  Stop if you develop a health problem that would prevent you from having lung cancer treatment.  Colorectal  Cancer  This type of cancer can be detected and can often be prevented.  Routine colorectal cancer screening usually begins at age 92 and continues through age 47.  If you have risk factors for colon cancer, your health care provider may recommend that you be screened at an earlier age.  If you have a family history of colorectal cancer, talk with your health care provider about genetic screening.  Your health care provider may also recommend using home test kits to check for hidden blood in your stool.  A small camera at the end of a tube can be used to examine your colon directly (sigmoidoscopy or colonoscopy). This is done to check for the earliest forms of colorectal cancer.  Direct examination of the colon should be repeated every 5-10 years until age 24. However, if early forms of precancerous polyps or small growths are found or if you have a family history or genetic risk for colorectal cancer, you may need to be screened more often.  Skin Cancer  Check your skin from head to toe regularly.  Monitor any moles. Be sure to tell your health care provider: ? About any new moles or changes in moles, especially if there is a change in a mole's shape or color. ? If you have a mole that is larger than the size of a pencil eraser.  If any of your family members has a history of skin cancer, especially at a young age, talk with your health care provider about genetic screening.  Always use sunscreen. Apply sunscreen liberally and repeatedly throughout the day.  Whenever you are outside, protect yourself by wearing long sleeves, pants, a wide-brimmed hat, and sunglasses.  What should I know about osteoporosis? Osteoporosis is a condition in which bone destruction happens more quickly than new bone creation. After menopause, you may be at an increased risk for osteoporosis. To help prevent osteoporosis or the bone fractures that can happen because of osteoporosis, the following is  recommended:  If you are 78-67 years old, get at least 1,000 mg of calcium and at least 600 mg of vitamin D per day.  If you are older than age 78 but younger than age 20, get at least 1,200 mg of calcium and at least 600 mg of vitamin D per day.  If you are older than age 24, get at least 1,200 mg of calcium and at least 800 mg of vitamin D per day.  Smoking and excessive alcohol intake increase the risk of osteoporosis. Eat foods that are rich in calcium and vitamin D, and do weight-bearing exercises several times each week as directed by your health care provider. What should I know about how menopause affects my mental health? Depression may occur at any age, but it is more common as  you become older. Common symptoms of depression include:  Low or sad mood.  Changes in sleep patterns.  Changes in appetite or eating patterns.  Feeling an overall lack of motivation or enjoyment of activities that you previously enjoyed.  Frequent crying spells.  Talk with your health care provider if you think that you are experiencing depression. What should I know about immunizations? It is important that you get and maintain your immunizations. These include:  Tetanus, diphtheria, and pertussis (Tdap) booster vaccine.  Influenza every year before the flu season begins.  Pneumonia vaccine.  Shingles vaccine.  Your health care provider may also recommend other immunizations. This information is not intended to replace advice given to you by your health care provider. Make sure you discuss any questions you have with your health care provider. Document Released: 01/30/2006 Document Revised: 06/27/2016 Document Reviewed: 09/11/2015 Elsevier Interactive Patient Education  2018 [employer].

## 2018-03-31 DIAGNOSIS — Z780 Asymptomatic menopausal state: Secondary | ICD-10-CM | POA: Insufficient documentation

## 2018-03-31 DIAGNOSIS — E063 Autoimmune thyroiditis: Secondary | ICD-10-CM | POA: Insufficient documentation

## 2018-03-31 NOTE — Assessment & Plan Note (Signed)
No longer taking remeron .  Alprazolam prn use discussed.

## 2018-03-31 NOTE — Assessment & Plan Note (Signed)
tsh was slightly elevated,  Will repeat in 6 weekw.

## 2018-03-31 NOTE — Assessment & Plan Note (Signed)
Annual comprehensive preventive exam was done as well as an evaluation and management of chronic conditions .  During the course of the visit the patient was educated and counseled about appropriate screening and preventive services including :  diabetes screening, lipid analysis with projected  10 year  risk for CAD , nutrition counseling, breast, cervical and colorectal cancer screening, and recommended immunizations.  Printed recommendations for health maintenance screenings was give 

## 2018-03-31 NOTE — Assessment & Plan Note (Signed)
She remains symptomatic without HRT and is willing to continue Estradiol out of pocket 

## 2018-04-20 ENCOUNTER — Telehealth: Payer: Self-pay

## 2018-04-20 NOTE — Telephone Encounter (Signed)
Patient is willing to pay out of pocket per last OV for estradiol

## 2018-04-20 NOTE — Telephone Encounter (Signed)
Prior authorization for Estradiol tablet 10mg  was denied by insurance.

## 2018-04-29 ENCOUNTER — Other Ambulatory Visit: Payer: Self-pay | Admitting: Internal Medicine

## 2018-04-29 DIAGNOSIS — K222 Esophageal obstruction: Principal | ICD-10-CM

## 2018-04-29 DIAGNOSIS — K219 Gastro-esophageal reflux disease without esophagitis: Secondary | ICD-10-CM

## 2018-05-11 ENCOUNTER — Ambulatory Visit: Payer: Medicare Other | Admitting: Internal Medicine

## 2018-05-11 ENCOUNTER — Other Ambulatory Visit: Payer: Medicare Other

## 2018-05-12 ENCOUNTER — Other Ambulatory Visit (INDEPENDENT_AMBULATORY_CARE_PROVIDER_SITE_OTHER): Payer: Medicare Other

## 2018-05-12 DIAGNOSIS — R7989 Other specified abnormal findings of blood chemistry: Secondary | ICD-10-CM

## 2018-05-12 LAB — TSH: TSH: 2.48 u[IU]/mL (ref 0.35–4.50)

## 2018-05-12 LAB — T4, FREE: FREE T4: 1 ng/dL (ref 0.60–1.60)

## 2018-05-19 ENCOUNTER — Encounter: Payer: Self-pay | Admitting: Internal Medicine

## 2018-05-19 ENCOUNTER — Ambulatory Visit (INDEPENDENT_AMBULATORY_CARE_PROVIDER_SITE_OTHER): Payer: Medicare Other | Admitting: Podiatry

## 2018-05-19 ENCOUNTER — Ambulatory Visit: Payer: Medicare Other | Admitting: Internal Medicine

## 2018-05-19 ENCOUNTER — Ambulatory Visit (INDEPENDENT_AMBULATORY_CARE_PROVIDER_SITE_OTHER): Payer: Medicare Other

## 2018-05-19 VITALS — BP 120/66 | HR 100 | Temp 98.0°F | Resp 15 | Ht 64.0 in | Wt 123.2 lb

## 2018-05-19 DIAGNOSIS — S93402A Sprain of unspecified ligament of left ankle, initial encounter: Secondary | ICD-10-CM | POA: Diagnosis not present

## 2018-05-19 DIAGNOSIS — S82892A Other fracture of left lower leg, initial encounter for closed fracture: Secondary | ICD-10-CM

## 2018-05-19 DIAGNOSIS — M25572 Pain in left ankle and joints of left foot: Secondary | ICD-10-CM

## 2018-05-19 DIAGNOSIS — M79676 Pain in unspecified toe(s): Secondary | ICD-10-CM

## 2018-05-19 NOTE — Patient Instructions (Signed)
You have either a serious ligament sprain ,  Or a hairline fracture of your lateral malleolus   The x rays today will help rule out a fracture   Referral to [facility]) in progress  Rest Ice compression Elevation   Ankle Sprain An ankle sprain is a stretch or tear in one of the tough, fiber-like tissues (ligaments) in the ankle. The ligaments in your ankle help to hold the bones of the ankle together. What are the causes? This condition is often caused by stepping on or falling on the outer edge of the foot. What increases the risk? This condition is more likely to develop in people who play sports. What are the signs or symptoms? Symptoms of this condition include:  Pain in your ankle.  Swelling.  Bruising. Bruising may develop right after you sprain your ankle or 1-2 days later.  Trouble standing or walking, especially when you turn or change directions.  How is this diagnosed? This condition is diagnosed with a physical exam. During the exam, your health care provider will press on certain parts of your foot and ankle and try to move them in certain ways. X-rays may be taken to see how severe the sprain is and to check for broken bones. How is this treated? This condition may be treated with:  A brace. This is used to keep the ankle from moving until it heals.  An elastic bandage. This is used to support the ankle.  Crutches.  Pain medicine.  Surgery. This may be needed if the sprain is severe.  Physical therapy. This may help to improve the range of motion in the ankle.  Follow these instructions at home:  Rest your ankle.  Take over-the-counter and prescription medicines only as told by your health care provider.  For 2-3 days, keep your ankle raised (elevated) above the level of your heart as much as possible.  If directed, apply ice to the area: ? Put ice in a plastic bag. ? Place a towel between your skin and the bag. ? Leave the ice on for 20 minutes, 2-3  times a day.  If you were given a brace: ? Wear it as directed. ? Remove it to shower or bathe. ? Try not to move your ankle much, but wiggle your toes from time to time. This helps to prevent swelling.  If you were given an elastic bandage (dressing): ? Remove it to shower or bathe. ? Try not to move your ankle much, but wiggle your toes from time to time. This helps to prevent swelling. ? Adjust the dressing to make it more comfortable if it feels too tight. ? Loosen the dressing if you have numbness or tingling in your foot, or if your foot becomes cold and blue.  If you have crutches, use them as told by your health care provider. Continue to use them until you can walk without feeling pain in your ankle. Contact a health care provider if:  You have rapidly increasing bruising or swelling.  Your pain is not relieved with medicine. Get help right away if:  Your toes or foot becomes numb or blue.  You have severe pain that gets worse. This information is not intended to replace advice given to you by your health care provider. Make sure you discuss any questions you have with your health care provider. Document Released: 12/08/2005 Document Revised: 01/15/2017 Document Reviewed: 07/10/2015 Elsevier Interactive Patient Education  [employer].

## 2018-05-19 NOTE — Progress Notes (Signed)
Patient comes in today by referral from Dr. Derrel Nip to pick up a cam walker boot.  Dr. Milinda Pointer spoke with Dr. Derrel Nip about a xray of lt ankle.  He will follow up with her in 2 weeks with follow up xray lt ankle

## 2018-05-19 NOTE — Progress Notes (Signed)
Subjective:  Patient ID: Veronica Ferrell, female    DOB: [birth year]  Age: 67 y.o. MRN: [id number]  CC: The primary encounter diagnosis was Acute left ankle pain. A diagnosis of Sprain of left ankle, unspecified ligament, initial encounter was also pertinent to this visit.  HPI LYLITH BEBEAU presents for left ankle pain for the past 2 weeks . The pain occurred after missing the bottom step of an unfamiliar staircase.  She had excruciating pain for an hours,  But she did not have excessive bruising.  She has been bearing weight and trying to resume her regular exercise regimen   for the past 2 weeks with some difficulty,  The pain is located along her lateral malleolus, but at times involves the distal medial  malleolus  It it aggravated by plantar flexing .    Plain films today were negative for fractures but showed lateral soft tissue swof the lateral malleolus.   Outpatient Medications Prior to Visit  Medication Sig Dispense Refill  . ALPRAZolam (XANAX) 1 MG tablet Take 1 tablet (1 mg total) by mouth at bedtime as needed for anxiety. For refill on or after Dec 07 2016 30 tablet 5  . aspirin 81 MG tablet Take 81 mg by mouth once a week.     . calcium citrate (CALCITRATE - DOSED IN MG ELEMENTAL CALCIUM) 950 MG tablet Take 1 tablet by mouth daily.    . Cholecalciferol (VITAMIN D3) 1000 UNITS CAPS Take 1 capsule by mouth daily.    Marland Kitchen estradiol (ESTRACE) 1 MG tablet Take 1 tablet (1 mg total) by mouth daily. 90 tablet 3  . fludrocortisone (FLORINEF) 0.1 MG tablet Take 1 tablet (100 mcg total) by mouth daily. 90 tablet 1  . hydrocortisone (CORTEF) 10 MG tablet TAKE 2 TABLETS BY MOUTH EVERY MORNING AND 1 TABLET BY MOUTH IN THE EVENING AS DIRECTED BY PHYSICIAN. 270 tablet 1  . ipratropium (ATROVENT) 0.06 % nasal spray USE 2 SPRAYS IN EACH NOSTRIL FOUR TIMES DAILY 15 mL 2  . mirtazapine (REMERON) 30 MG tablet Take 1 tablet (30 mg total) by mouth at bedtime. 90 tablet 1  . Multiple Vitamins-Minerals  (MULTIVITAMIN WITH MINERALS) tablet Take 1 tablet by mouth daily.    Marland Kitchen omeprazole (PRILOSEC) 40 MG capsule TAKE 1 CAPSULE BY MOUTH EVERY DAY 90 capsule 1  . polyethylene glycol powder (GLYCOLAX/MIRALAX) powder USE AS DIRECTED 527 g 1  . promethazine (PHENERGAN) 12.5 MG tablet Take 1 tablet (12.5 mg total) by mouth every 6 (six) hours as needed for nausea or vomiting. 30 tablet 0  . vitamin C (ASCORBIC ACID) 500 MG tablet Take 500 mg by mouth daily.    Marland Kitchen levofloxacin (LEVAQUIN) 500 MG tablet Take 1 tablet (500 mg total) by mouth daily. (Patient not taking: Reported on 05/19/2018) 7 tablet 0  . mirtazapine (REMERON) 30 MG tablet TAKE 1 TABLET BY MOUTH AT BEDTIME (Patient not taking: Reported on 03/30/2018) 90 tablet 1  . mirtazapine (REMERON) 30 MG tablet Take 1 tablet (30 mg total) by mouth at bedtime. (Patient not taking: Reported on 03/30/2018) 90 tablet 1   No facility-administered medications prior to visit.     Review of Systems;  Patient denies headache, fevers, malaise, unintentional weight loss, skin rash, eye pain, sinus congestion and sinus pain, sore throat, dysphagia,  hemoptysis , cough, dyspnea, wheezing, chest pain, palpitations, orthopnea, edema, abdominal pain, nausea, melena, diarrhea, constipation, flank pain, dysuria, hematuria, urinary  Frequency, nocturia, numbness, tingling, seizures,  Focal weakness,  Loss of consciousness,  Tremor, insomnia, depression, anxiety, and suicidal ideation.      Objective:  BP 120/66 (BP Location: Left Arm, Patient Position: Sitting, Cuff Size: Normal)   Pulse 100   Temp 98 F (36.7 C) (Oral)   Resp 15   Ht 5\' 4"  (1.626 m)   Wt 123 lb 3.2 oz (55.9 kg)   SpO2 96%   BMI 21.15 kg/m   BP Readings from Last 3 Encounters:  05/19/18 120/66  03/30/18 102/60  04/10/17 97/68    Wt Readings from Last 3 Encounters:  05/19/18 123 lb 3.2 oz (55.9 kg)  03/30/18 126 lb 6.4 oz (57.3 kg)  04/10/17 122 lb (55.3 kg)    General appearance: alert,  cooperative and appears stated age Back: symmetric, no curvature. ROM normal. No CVA tenderness. Pulses: 2+ and symmetric Skin: Skin color, texture, turgor normal. No rashes or lesions MSK:  Pain palpation of lateral malleolus left ankle,  No buising noted,  ROM restricted from full plantar flexion due to pain.  No results found for: HGBA1C  Lab Results  Component Value Date   CREATININE 0.78 03/23/2018   CREATININE 0.95 03/19/2017   CREATININE 0.90 06/04/2016    Lab Results  Component Value Date   WBC 7.6 03/23/2018   HGB 14.0 03/23/2018   HCT 42.1 03/23/2018   PLT 351.0 03/23/2018   GLUCOSE 89 03/23/2018   CHOL 206 (H) 03/23/2018   TRIG 76.0 03/23/2018   HDL 93.00 03/23/2018   LDLCALC 98 03/23/2018   ALT 14 03/23/2018   AST 18 03/23/2018   NA 131 (L) 03/23/2018   K 4.6 03/23/2018   CL 97 03/23/2018   CREATININE 0.78 03/23/2018   BUN 23 03/23/2018   CO2 29 03/23/2018   TSH 2.48 05/12/2018    Mm Screening Breast Tomo Bilateral  Result Date: 02/04/2018 CLINICAL DATA:  Screening. EXAM: DIGITAL SCREENING BILATERAL MAMMOGRAM WITH TOMO AND CAD COMPARISON:  Previous exam(s). ACR Breast Density Category d: The breast tissue is extremely dense, which lowers the sensitivity of mammography. FINDINGS: There are no findings suspicious for malignancy. Images were processed with CAD. IMPRESSION: No mammographic evidence of malignancy. A result letter of this screening mammogram will be mailed directly to the patient. RECOMMENDATION: Screening mammogram in one year. (Code:SM-B-01Y) BI-RADS CATEGORY  1: Negative. Electronically Signed   By: Ammie Ferrier M.D.   On: 02/04/2018 10:56    Assessment & Plan:   Problem List Items Addressed This Visit    Left ankle sprain    Secondary to fall that occurred two weeks prior to presentation,  Plain  x rays negative for fracture.  Discussed case with her podiatrist Dr Milinda Pointer by phone,  Sending patient over for placement in CAM boot and 2 week  follow up films        Other Visit Diagnoses    Acute left ankle pain    -  Primary   Relevant Orders   DG Ankle Complete Left (Completed)   Ambulatory referral to Podiatry    A total of 40 minutes was spent with patient more than half of which was spent in counseling patient on the above mentioned issues , reviewing  and imaging studies done, discussing patient with Dr Milinda Pointer, and coordination of care.  I have discontinued Lluvia H. Aversa's levofloxacin. I am also having her maintain her aspirin, Vitamin D3, calcium citrate, multivitamin with minerals, vitamin C, mirtazapine, hydrocortisone, fludrocortisone, polyethylene glycol powder, ALPRAZolam, promethazine, estradiol, ipratropium, and omeprazole.  No  orders of the defined types were placed in this encounter.   Medications Discontinued During This Encounter  Medication Reason  . levofloxacin (LEVAQUIN) 500 MG tablet Patient has not taken in last 30 days  . mirtazapine (REMERON) 30 MG tablet Duplicate  . mirtazapine (REMERON) 30 MG tablet Duplicate    Follow-up: No follow-ups on file.   Crecencio Mc, MD

## 2018-05-21 DIAGNOSIS — S93402A Sprain of unspecified ligament of left ankle, initial encounter: Secondary | ICD-10-CM | POA: Insufficient documentation

## 2018-05-21 NOTE — Assessment & Plan Note (Addendum)
Secondary to fall that occurred two weeks prior to presentation,  Plain  x rays negative for fracture.  Discussed case with her podiatrist Dr Milinda Pointer by phone,  Sending patient over for placement in CAM boot and 2 week follow up films

## 2018-05-25 ENCOUNTER — Other Ambulatory Visit: Payer: Self-pay | Admitting: Internal Medicine

## 2018-05-25 NOTE — Telephone Encounter (Signed)
Refilled: 02/04/2018 Last OV: 05/19/2018 Next OV: 04/13/2019

## 2018-06-03 ENCOUNTER — Ambulatory Visit: Payer: Medicare Other | Admitting: Podiatry

## 2018-06-03 ENCOUNTER — Encounter: Payer: Self-pay | Admitting: Podiatry

## 2018-06-03 ENCOUNTER — Ambulatory Visit (INDEPENDENT_AMBULATORY_CARE_PROVIDER_SITE_OTHER): Payer: Medicare Other

## 2018-06-03 DIAGNOSIS — S82892D Other fracture of left lower leg, subsequent encounter for closed fracture with routine healing: Secondary | ICD-10-CM

## 2018-06-03 NOTE — Progress Notes (Signed)
She presents today for follow-up of her stress fracture to her ankle.  She states that seems to be doing fine and is feeling much better as she refers to her left ankle around the fibula.  She states that she is been wearing her boot faithfully for the past 2 to 3 weeks.  Objective: Vital signs are stable she is alert and oriented x3.  Pulses are palpable.  She has minimal tenderness on palpation of the fibula primarily medial lateral compression.  Radiographs do not demonstrate any type of osseous abnormalities.  Assessment: Resolving stress fracture or stress injury to the fibula.  Plan: Encouraged her to wear compression anklet which we dispensed today and I will follow-up with her on an as-needed basis.  I warned her of overactivity.

## 2018-06-09 ENCOUNTER — Ambulatory Visit: Payer: Medicare Other | Admitting: Podiatry

## 2018-06-11 ENCOUNTER — Telehealth: Payer: Self-pay | Admitting: *Deleted

## 2018-06-11 NOTE — Telephone Encounter (Signed)
Copied from [place] [id number]. Topic: Appointment Scheduling - Scheduling Inquiry for Clinic >> Jun 11, 2018  9:49 AM Boyd Kerbs wrote: Reason for CRM:  pt. Got call from [facility] to schedule her [place].  No crm please call her

## 2018-06-11 NOTE — Telephone Encounter (Signed)
Message given to Rashedah to schedule

## 2018-06-17 ENCOUNTER — Ambulatory Visit (INDEPENDENT_AMBULATORY_CARE_PROVIDER_SITE_OTHER): Payer: Medicare Other

## 2018-06-17 VITALS — BP 108/62 | HR 81 | Temp 98.7°F | Resp 15 | Ht 64.5 in | Wt 123.1 lb

## 2018-06-17 DIAGNOSIS — Z0000 Encounter for general adult medical examination without abnormal findings: Secondary | ICD-10-CM

## 2018-06-17 NOTE — Patient Instructions (Addendum)
  Ms. Shugrue , Thank you for taking time to come for your Medicare Wellness Visit. I appreciate your ongoing commitment to your health goals. Please review the following plan we discussed and let me know if I can assist you in the future.   Follow up as needed.    Bring a copy of your [place] and/or Living Will to be scanned into chart.  Have a great day!  These are the goals we discussed: Goals    . Maintain Healthy Lifestyle       This is a list of the screening recommended for you and due dates:  Health Maintenance  Topic Date Due  . Flu Shot  07/22/2018  . Mammogram  02/03/2019  . Tetanus Vaccine  03/08/2021  . Colon Cancer Screening  01/09/2024  . DEXA scan (bone density measurement)  Completed  .  Hepatitis C: One time screening is recommended by Center for Disease Control  (CDC) for  adults born from [birth year] through 1967.   Completed  . Pneumonia vaccines  Completed

## 2018-06-17 NOTE — Progress Notes (Addendum)
Subjective:   Veronica Ferrell is a 67 y.o. female who presents for Medicare Annual (Subsequent) preventive examination.  Review of Systems:  No ROS.  Medicare Wellness Visit. Additional risk factors are reflected in the social history.     Objective:     Vitals: BP 108/62 (BP Location: Left Arm, Patient Position: Sitting, Cuff Size: Normal)   Pulse 81   Temp 98.7 F (37.1 C) (Oral)   Resp 15   Ht 5' 4.5" (1.638 m)   Wt 123 lb 1.9 oz (55.8 kg)   SpO2 96%   BMI 20.81 kg/m   Body mass index is 20.81 kg/m.  Advanced Directives 06/17/2018 04/10/2017  Does Patient Have a Medical Advance Directive? Yes Yes  Type of Paramedic of [place];Living will [place]  Does patient want to make changes to medical advance directive? No - Patient declined No - Patient declined  Copy of [place] in Chart? No - copy requested No - copy requested    Tobacco Social History   Tobacco Use  Smoking Status Never Smoker  Smokeless Tobacco Never Used     Counseling given: Not Answered   Clinical Intake:  Pre-visit preparation completed: Yes  Pain : No/denies pain     Nutritional Status: BMI of 19-24  Normal Diabetes: No  How often do you need to have someone help you when you read instructions, pamphlets, or other written materials from your doctor or pharmacy?: 1 - Never  Interpreter Needed?: No     Past Medical History:  Diagnosis Date  . Addison's disease ([place])   . Arthritis    fingers  . Cancer ([place])    skin  . Chicken pox   . Colon polyps   . Diverticulitis   . GERD (gastroesophageal reflux disease)    Past Surgical History:  Procedure Laterality Date  . ABDOMINAL HYSTERECTOMY    . APPENDECTOMY    . BREAST BIOPSY Right 2010  . BREAST SURGERY    . ESOPHAGOGASTRODUODENOSCOPY (EGD) WITH PROPOFOL N/A 04/10/2017   Procedure: ESOPHAGOGASTRODUODENOSCOPY (EGD) WITH PROPOFOL;  Surgeon: Lucilla Lame, MD;   Location: [facility];  Service: Endoscopy;  Laterality: N/A;  requeests early as possible  . FOOT SURGERY Bilateral   . varicose veins     Family History  Problem Relation Age of Onset  . Cancer Mother 68       Breast Ca  colon Ca (44)  and brain Ca (54)  . Breast cancer Mother 57  . Cancer Father   . Cancer Maternal Aunt        breast  . Breast cancer Maternal Aunt   . Cancer Maternal Grandmother        breast  . Breast cancer Maternal Grandmother   . Cancer Maternal Aunt        breast ca  . Breast cancer Maternal Aunt    Social History   Socioeconomic History  . Marital status: Married    Spouse name: Not on file  . Number of children: Not on file  . Years of education: Not on file  . Highest education level: Not on file  Occupational History  . Not on file  Social Needs  . Financial resource strain: Not hard at all  . Food insecurity:    Worry: Never true    Inability: Never true  . Transportation needs:    Medical: No    Non-medical: No  Tobacco Use  .  Smoking status: Never Smoker  . Smokeless tobacco: Never Used  Substance and Sexual Activity  . Alcohol use: Yes    Alcohol/week: 4.2 oz    Types: 7 Glasses of wine per week    Comment: [place]  . Drug use: No  . Sexual activity: Yes  Lifestyle  . Physical activity:    Days per week: 5 days    Minutes per session: 60 min  . Stress: Not at all  Relationships  . Social connections:    Talks on phone: Not on file    Gets together: Not on file    Attends religious service: Not on file    Active member of club or organization: Not on file    Attends meetings of clubs or organizations: Not on file    Relationship status: Not on file  Other Topics Concern  . Not on file  Social History Narrative  . Not on file    Outpatient Encounter Medications as of 06/17/2018  Medication Sig  . ALPRAZolam (XANAX) 1 MG tablet Take 1 tablet (1 mg total) by mouth at bedtime as needed for anxiety. For  refill on or after Dec 07 2016  . aspirin 81 MG tablet Take 81 mg by mouth once a week.   . calcium citrate (CALCITRATE - DOSED IN MG ELEMENTAL CALCIUM) 950 MG tablet Take 1 tablet by mouth daily.  . Cholecalciferol (VITAMIN D3) 1000 UNITS CAPS Take 1 capsule by mouth daily.  Marland Kitchen estradiol (ESTRACE) 1 MG tablet Take 1 tablet (1 mg total) by mouth daily.  . fludrocortisone (FLORINEF) 0.1 MG tablet Take 1 tablet (100 mcg total) by mouth daily.  . hydrocortisone (CORTEF) 10 MG tablet TAKE TWO TABLETS BY MOUTH EVERY MORNING AND TAKE ONE TABLET BY MOUTH EVERY EVENING AS DIRECTED BY PHYSICIAN  . ipratropium (ATROVENT) 0.06 % nasal spray USE 2 SPRAYS IN EACH NOSTRIL FOUR TIMES DAILY  . mirtazapine (REMERON) 30 MG tablet Take 1 tablet (30 mg total) by mouth at bedtime.  . Multiple Vitamins-Minerals (MULTIVITAMIN WITH MINERALS) tablet Take 1 tablet by mouth daily.  Marland Kitchen omeprazole (PRILOSEC) 40 MG capsule TAKE 1 CAPSULE BY MOUTH EVERY DAY  . polyethylene glycol powder (GLYCOLAX/MIRALAX) powder USE AS DIRECTED  . promethazine (PHENERGAN) 12.5 MG tablet Take 1 tablet (12.5 mg total) by mouth every 6 (six) hours as needed for nausea or vomiting.  . vitamin C (ASCORBIC ACID) 500 MG tablet Take 500 mg by mouth daily.   No facility-administered encounter medications on file as of 06/17/2018.     Activities of Daily Living In your present state of health, do you have any difficulty performing the following activities: 06/17/2018  Hearing? N  Vision? N  Difficulty concentrating or making decisions? N  Walking or climbing stairs? N  Dressing or bathing? N  Doing errands, shopping? N  Preparing Food and eating ? N  Using the Toilet? N  In the past six months, have you accidently leaked urine? N  Do you have problems with loss of bowel control? N  Managing your Medications? N  Managing your Finances? N  Housekeeping or managing your Housekeeping? N  Some recent data might be hidden    Patient Care  Team: Crecencio Mc, MD as PCP - General (Internal Medicine)    Assessment:   This is a routine wellness examination for [name].  The goal of the wellness visit is to assist the patient how to close the gaps in care and create a  preventative care plan for the patient.   The roster of all physicians providing medical care to patient is listed in the Snapshot section of the chart.  Taking calcium VIT D as appropriate/Osteoporosis risk reviewed.    Safety issues reviewed; Smoke and carbon monoxide detectors in the home. No firearms in the home. Wears seatbelts when driving or riding with others. No violence in the home.  They do not have excessive sun exposure.  Discussed the need for sun protection: hats, long sleeves and the use of sunscreen if there is significant sun exposure.  Patient is alert, normal appearance, oriented to person/place/and time. Correctly identified the president of the [place] and recalls of 3/3 words.Performs simple calculations and can read correct time from watch face. Displays appropriate judgement.  No new identified risk were noted.  No failures at ADL's or IADL's.    BMI- discussed the importance of a healthy diet, water intake and the benefits of aerobic exercise. Educational material provided.   24 hour diet recall: Low carb diet  Dental- every 6 months.  Sleep patterns- Sleeps 8 hours at night.  Wakes feeling rested.   Health maintenance gaps- closed.  Patient Concerns: None at this time. Follow up with PCP as needed.  Exercise Activities and Dietary recommendations Current Exercise Habits: Structured exercise class, Type of exercise: calisthenics;yoga;strength training/weights;walking;treadmill, Time (Minutes): 60, Frequency (Times/Week): 5, Weekly Exercise (Minutes/Week): 300  Goals    . Maintain Healthy Lifestyle       Fall Risk Fall Risk  06/17/2018 03/30/2018 03/25/2017 01/30/2016 09/11/2014  Falls in the past year? No No No No No    Depression Screen PHQ 2/9 Scores 06/17/2018 03/25/2017 01/30/2016 09/11/2014  PHQ - 2 Score 0 0 0 0     Cognitive Function MMSE - Mini Mental State Exam 06/17/2018  Orientation to time 5  Orientation to Place 5  Registration 3  Attention/ Calculation 5  Recall 3  Language- name 2 objects 2  Language- repeat 1  Language- follow 3 step command 3  Language- read & follow direction 1  Write a sentence 1  Copy design 1  Total score 30        Immunization History  Administered Date(s) Administered  . Influenza Split 09/21/2013, 09/19/2015, 08/30/2017  . Influenza,inj,Quad PF,6+ Mos 09/11/2014  . Pneumococcal Conjugate-13 09/11/2014  . Pneumococcal Polysaccharide-23 03/08/2012, 03/30/2018  . Tdap 03/09/2011  . Zoster 03/08/2012    Screening Tests Health Maintenance  Topic Date Due  . INFLUENZA VACCINE  07/22/2018  . MAMMOGRAM  02/03/2019  . TETANUS/TDAP  03/08/2021  . COLONOSCOPY  01/09/2024  . DEXA SCAN  Completed  . Hepatitis C Screening  Completed  . PNA vac Low Risk Adult  Completed      Plan:    End of life planning; Advance aging; Advanced directives discussed. Copy of current HCPOA/Living Will requested.    I have personally reviewed and noted the following in the patient's chart:   . Medical and social history . Use of alcohol, tobacco or illicit drugs  . Current medications and supplements . Functional ability and status . Nutritional status . Physical activity . Advanced directives . List of other physicians . Hospitalizations, surgeries, and ER visits in previous 12 months . Vitals . Screenings to include cognitive, depression, and falls . Referrals and appointments  In addition, I have reviewed and discussed with patient certain preventive protocols, quality metrics, and best practice recommendations. A written personalized care plan for preventive services as well as general  preventive health recommendations were provided to patient.      OBrien-Blaney, Elsy Chiang L, LPN  8/42/1031     I have reviewed the above information and agree with above.   Deborra Medina, MD

## 2018-07-30 ENCOUNTER — Emergency Department
Admission: EM | Admit: 2018-07-30 | Discharge: 2018-07-30 | Disposition: A | Discharge status: Home / Self Care | Payer: Medicare Other | Attending: Emergency Medicine | Admitting: Emergency Medicine

## 2018-07-30 ENCOUNTER — Emergency Department: Payer: Medicare Other

## 2018-07-30 ENCOUNTER — Other Ambulatory Visit: Payer: Self-pay

## 2018-07-30 ENCOUNTER — Telehealth: Payer: Self-pay | Admitting: Internal Medicine

## 2018-07-30 ENCOUNTER — Ambulatory Visit: Payer: Self-pay

## 2018-07-30 DIAGNOSIS — R1030 Lower abdominal pain, unspecified: Secondary | ICD-10-CM

## 2018-07-30 DIAGNOSIS — Z7982 Long term (current) use of aspirin: Secondary | ICD-10-CM | POA: Insufficient documentation

## 2018-07-30 DIAGNOSIS — K5900 Constipation, unspecified: Secondary | ICD-10-CM

## 2018-07-30 DIAGNOSIS — K5641 Fecal impaction: Secondary | ICD-10-CM | POA: Insufficient documentation

## 2018-07-30 DIAGNOSIS — Z85828 Personal history of other malignant neoplasm of skin: Secondary | ICD-10-CM | POA: Diagnosis not present

## 2018-07-30 DIAGNOSIS — Z79899 Other long term (current) drug therapy: Secondary | ICD-10-CM | POA: Diagnosis not present

## 2018-07-30 LAB — CBC WITH DIFFERENTIAL/PLATELET
BASOS ABS: 0.1 10*3/uL (ref 0–0.1)
Basophils Relative: 1 %
Eosinophils Absolute: 0.1 10*3/uL (ref 0–0.7)
Eosinophils Relative: 1 %
HEMATOCRIT: 41.9 % (ref 35.0–47.0)
Hemoglobin: 14.4 g/dL (ref 12.0–16.0)
LYMPHS PCT: 31 %
Lymphs Abs: 3.3 10*3/uL (ref 1.0–3.6)
MCH: 33 pg (ref 26.0–34.0)
MCHC: 34.3 g/dL (ref 32.0–36.0)
MCV: 96.3 fL (ref 80.0–100.0)
MONO ABS: 0.7 10*3/uL (ref 0.2–0.9)
MONOS PCT: 7 %
NEUTROS ABS: 6.5 10*3/uL (ref 1.4–6.5)
Neutrophils Relative %: 60 %
Platelets: 335 10*3/uL (ref 150–440)
RBC: 4.35 MIL/uL (ref 3.80–5.20)
RDW: 13.6 % (ref 11.5–14.5)
WBC: 10.7 10*3/uL (ref 3.6–11.0)

## 2018-07-30 LAB — URINALYSIS, COMPLETE (UACMP) WITH MICROSCOPIC
BILIRUBIN URINE: NEGATIVE
Bacteria, UA: NONE SEEN
Glucose, UA: NEGATIVE mg/dL
Hgb urine dipstick: NEGATIVE
KETONES UR: NEGATIVE mg/dL
LEUKOCYTES UA: NEGATIVE
Nitrite: NEGATIVE
PH: 6 (ref 5.0–8.0)
Protein, ur: NEGATIVE mg/dL
Specific Gravity, Urine: 1.015 (ref 1.005–1.030)

## 2018-07-30 LAB — COMPREHENSIVE METABOLIC PANEL
ALK PHOS: 29 U/L — AB (ref 38–126)
ALT: 16 U/L (ref 0–44)
AST: 28 U/L (ref 15–41)
Albumin: 4.2 g/dL (ref 3.5–5.0)
Anion gap: 11 (ref 5–15)
BILIRUBIN TOTAL: 0.9 mg/dL (ref 0.3–1.2)
BUN: 22 mg/dL (ref 8–23)
CALCIUM: 9.4 mg/dL (ref 8.9–10.3)
CO2: 23 mmol/L (ref 22–32)
CREATININE: 1.02 mg/dL — AB (ref 0.44–1.00)
Chloride: 102 mmol/L (ref 98–111)
GFR calc Af Amer: >60 mL/min (ref >60)
GFR, EST NON AFRICAN AMERICAN: 55 mL/min — AB (ref >60)
Glucose, Bld: 95 mg/dL (ref 70–99)
POTASSIUM: 3.9 mmol/L (ref 3.5–5.1)
Sodium: 136 mmol/L (ref 135–145)
TOTAL PROTEIN: 7 g/dL (ref 6.5–8.1)

## 2018-07-30 LAB — LIPASE, BLOOD: LIPASE: 36 U/L (ref 11–51)

## 2018-07-30 MED ORDER — ONDANSETRON HCL 4 MG/2ML IJ SOLN
4.0000 mg | Freq: Once | INTRAMUSCULAR | Status: AC
  Administered 2018-07-30: 4 mg via INTRAVENOUS

## 2018-07-30 MED ORDER — MORPHINE SULFATE (PF) 2 MG/ML IV SOLN
INTRAVENOUS | Status: AC
Start: 2018-07-30 — End: 2018-07-30
  Administered 2018-07-30: 2 mg via INTRAVENOUS
  Filled 2018-07-30: qty 1

## 2018-07-30 MED ORDER — SODIUM CHLORIDE 0.9 % IV BOLUS
1000.0000 mL | Freq: Once | INTRAVENOUS | Status: AC
  Administered 2018-07-30: 1000 mL via INTRAVENOUS

## 2018-07-30 MED ORDER — ONDANSETRON HCL 4 MG/2ML IJ SOLN
INTRAMUSCULAR | Status: AC
  Administered 2018-07-30: 4 mg via INTRAVENOUS
  Filled 2018-07-30: qty 2

## 2018-07-30 MED ORDER — LACTULOSE 20 GM/30ML PO SOLN: ORAL | 3 refills | Status: DC

## 2018-07-30 MED ORDER — IOHEXOL 300 MG/ML  SOLN
75.0000 mL | Freq: Once | INTRAMUSCULAR | Status: AC | PRN
  Administered 2018-07-30: 75 mL via INTRAVENOUS

## 2018-07-30 MED ORDER — MORPHINE SULFATE (PF) 2 MG/ML IV SOLN
2.0000 mg | Freq: Once | INTRAVENOUS | Status: AC
  Administered 2018-07-30: 2 mg via INTRAVENOUS

## 2018-07-30 NOTE — ED Notes (Signed)
Pt back from Ct

## 2018-07-30 NOTE — ED Notes (Signed)
Pt alert and oriented X4, active, cooperative, pt in NAD. RR even and unlabored, color WNL.  Pt informed to return if any life threatening symptoms occur.  Discharge and followup instructions reviewed. Left with family.  Pt is not driving.

## 2018-07-30 NOTE — Telephone Encounter (Signed)
Patient triaged by [facility] Nurse and advised to go to ED.

## 2018-07-30 NOTE — ED Triage Notes (Signed)
Sudden onset of severe lower abdominal pain to center of abdomen. Pt tearful. Nausea.

## 2018-07-30 NOTE — ED Notes (Addendum)
Pt had large BM. EDP notified.

## 2018-07-30 NOTE — ED Notes (Signed)
ED Provider at bedside. 

## 2018-07-30 NOTE — Telephone Encounter (Signed)
Pt. C/o severe lower abdominal cramping which started 30 minutes ago. States has problems with constipation. Had "a small BM this morning." Pain is a cramping pain and is a "10" on a scale of 1-10.Reports she is "in a cold sweat." "Has never had pain like this before." Pain is constant.States she has had diverticulitis in the past. Pt. Having shortness of breath as well. Pt. Instructed to go to ED for evaluation. Pt. Will have her husband take her. Reason for Disposition . [1] SEVERE pain AND [2] age > 50  Answer Assessment - Initial Assessment Questions 1. LOCATION: "Where does it hurt?"      Lower abdominal cramps 2. RADIATION: "Does the pain shoot anywhere else?" (e.g., chest, back)     No 3. ONSET: "When did the pain begin?" (e.g., minutes, hours or days ago)      30 minutes ago 4. SUDDEN: "Gradual or sudden onset?"     Sudden 5. PATTERN "Does the pain come and go, or is it constant?"    - If constant: "Is it getting better, staying the same, or worsening?"      (Note: Constant means the pain never goes away completely; most serious pain is constant and it progresses)     - If intermittent: "How long does it last?" "Do you have pain now?"     (Note: Intermittent means the pain goes away completely between bouts)     Constant 6. SEVERITY: "How bad is the pain?"  (e.g., Scale 1-10; mild, moderate, or severe)   - MILD (1-3): doesn't interfere with normal activities, abdomen soft and not tender to touch    - MODERATE (4-7): interferes with normal activities or awakens from sleep, tender to touch    - SEVERE (8-10): excruciating pain, doubled over, unable to do any normal activities      10 7. RECURRENT SYMPTOM: "Have you ever had this type of abdominal pain before?" If so, ask: "When was the last time?" and "What happened that time?"      No 8. CAUSE: "What do you think is causing the abdominal pain?"     unsure 9. RELIEVING/AGGRAVATING FACTORS: "What makes it better or worse?" (e.g.,  movement, antacids, bowel movement)     Nothing 10. OTHER SYMPTOMS: "Has there been any vomiting, diarrhea, constipation, or urine problems?"       Cold sweats, shortness of breath 11. PREGNANCY: "Is there any chance you are pregnant?" "When was your last menstrual period?"       No  Protocols used: ABDOMINAL PAIN - [facility]

## 2018-07-30 NOTE — ED Notes (Signed)
At bedside with EDP. EDP disimpacts patient. Pt up to bathroom, states she has to have BM.

## 2018-07-30 NOTE — ED Provider Notes (Signed)
[facility] Emergency Department Provider Note  ____________________________________________  Time seen: Approximately 9:02 AM  I have reviewed the triage vital signs and the nursing notes.   HISTORY  Chief Complaint Abdominal Pain   HPI Veronica Ferrell is a 67 y.o. female with history of Addison's disease and diverticulitis who presents for evaluation of abdominal pain.  Patient reports that she was in her usual state of health when she woke up this morning.  While eating breakfast she felt she had to have a bowel movement.  She kept going to and from the bathroom with cramping abdominal pain but was unable to have a bowel movement.  She does have a history of constipation and takes MiraLAX every day which usually helps.  Her last bowel movement was this morning before the pain started.  She reports that she had a small one.  She reports that while sitting on the toilet she developed severe lower abdominal cramping, she broke out in a sweat and felt very nauseous.  At this time the pain has resolved however she reports that the pain has been intermittent and she feels like the pain will come back.  She still complaining of nausea.  No fever chills, no dysuria or hematuria.  She is not passing flatus this morning.  She has had a hysterectomy and appendectomy in the past.  No history of SBO.  No abdominal distention.   Past Medical History:  Diagnosis Date  . Addison's disease ([place])   . Arthritis    fingers  . Cancer ([place])    skin  . Chicken pox   . Colon polyps   . Diverticulitis   . GERD (gastroesophageal reflux disease)     Patient Active Problem List   Diagnosis Date Noted  . Left ankle sprain 05/21/2018  . 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 Menopause 03/31/2018  . Problems with swallowing and mastication   . Reflux esophagitis   . Gastric polyp   . Encounter for preventive health examination 03/26/2017  . GERD with stricture  03/25/2017  . Breast mass, right 03/19/2016  . Whiplash injury 01/30/2016  . Chronic constipation 07/31/2015  . Abdominal pain, left lower quadrant 03/15/2015  . Menopause syndrome 01/24/2015  . Osteopenia 11/05/2014  . Vaginitis and vulvovaginitis 07/19/2014  . Medicare annual wellness visit, initial 03/09/2014  . 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 Family history of colon cancer requiring screening colonoscopy 07/08/2013  . Chronic insomnia 07/08/2013  . Addison's disease ([place])   . Diverticulosis of colon without hemorrhage     Past Surgical History:  Procedure Laterality Date  . ABDOMINAL HYSTERECTOMY    . APPENDECTOMY    . BREAST BIOPSY Right 2010  . BREAST SURGERY    . ESOPHAGOGASTRODUODENOSCOPY (EGD) WITH PROPOFOL N/A 04/10/2017   Procedure: ESOPHAGOGASTRODUODENOSCOPY (EGD) WITH PROPOFOL;  Surgeon: Lucilla Lame, MD;  Location: [facility];  Service: Endoscopy;  Laterality: N/A;  requeests early as possible  . FOOT SURGERY Bilateral   . varicose veins      Prior to Admission medications   Medication Sig Start Date End Date Taking? Authorizing Provider  ALPRAZolam Duanne Moron) 1 MG tablet Take 1 tablet (1 mg total) by mouth at bedtime as needed for anxiety. For refill on or after Dec 07 2016 03/30/18   Crecencio Mc, MD  aspirin 81 MG tablet Take 81 mg by mouth once a week.     [provider]  calcium citrate (CALCITRATE - DOSED IN MG  ELEMENTAL CALCIUM) 950 MG tablet Take 1 tablet by mouth daily.    [provider]  Cholecalciferol (VITAMIN D3) 1000 UNITS CAPS Take 1 capsule by mouth daily.    [provider]  estradiol (ESTRACE) 1 MG tablet Take 1 tablet (1 mg total) by mouth daily. 03/30/18   Crecencio Mc, MD  fludrocortisone (FLORINEF) 0.1 MG tablet Take 1 tablet (100 mcg total) by mouth daily. 02/04/18   Crecencio Mc, MD  hydrocortisone (CORTEF) 10 MG tablet TAKE TWO TABLETS BY MOUTH EVERY MORNING AND TAKE ONE TABLET BY MOUTH EVERY  EVENING AS DIRECTED BY PHYSICIAN 05/25/18   Crecencio Mc, MD  ipratropium (ATROVENT) 0.06 % nasal spray USE 2 SPRAYS IN EACH NOSTRIL FOUR TIMES DAILY 04/29/18   Crecencio Mc, MD  mirtazapine (REMERON) 30 MG tablet Take 1 tablet (30 mg total) by mouth at bedtime. 10/05/17   Crecencio Mc, MD  Multiple Vitamins-Minerals (MULTIVITAMIN WITH MINERALS) tablet Take 1 tablet by mouth daily.    [provider]  omeprazole (PRILOSEC) 40 MG capsule TAKE 1 CAPSULE BY MOUTH EVERY DAY 04/29/18   Crecencio Mc, MD  polyethylene glycol powder (GLYCOLAX/MIRALAX) powder USE AS DIRECTED 02/04/18   Crecencio Mc, MD  promethazine (PHENERGAN) 12.5 MG tablet Take 1 tablet (12.5 mg total) by mouth every 6 (six) hours as needed for nausea or vomiting. 03/30/18   Crecencio Mc, MD  vitamin C (ASCORBIC ACID) 500 MG tablet Take 500 mg by mouth daily.    [provider]    Allergies Alendronate sodium; Boniva [ibandronic acid]; Clindamycin/lincomycin; Tramadol; and Penicillins  Family History  Problem Relation Age of Onset  . Cancer Mother 70       Breast Ca  colon Ca (61)  and brain Ca (5)  . Breast cancer Mother 61  . Cancer Father   . Cancer Maternal Aunt        breast  . Breast cancer Maternal Aunt   . Cancer Maternal Grandmother        breast  . Breast cancer Maternal Grandmother   . Cancer Maternal Aunt        breast ca  . Breast cancer Maternal Aunt     Social History Social History   Tobacco Use  . Smoking status: Never Smoker  . Smokeless tobacco: Never Used  Substance Use Topics  . Alcohol use: Yes    Alcohol/week: 7.0 standard drinks    Types: 7 Glasses of wine per week    Comment: WINE EACH WEEK  . Drug use: No    Review of Systems  Constitutional: Negative for fever. Eyes: Negative for visual changes. ENT: Negative for sore throat. Neck: No neck pain  Cardiovascular: Negative for chest pain. Respiratory: Negative for shortness of breath. Gastrointestinal:  + lower abdominal pain, nausea. No vomiting or diarrhea. Genitourinary: Negative for dysuria. Musculoskeletal: Negative for back pain. Skin: Negative for rash. Neurological: Negative for headaches, weakness or numbness. Psych: No SI or HI  ____________________________________________   PHYSICAL EXAM:  VITAL SIGNS: ED Triage Vitals  Enc Vitals Group     BP 07/30/18 0846 128/78     Pulse Rate 07/30/18 0846 85     Resp 07/30/18 0846 16     Temp --      Temp src --      SpO2 07/30/18 0846 98 %     Weight 07/30/18 0848 122 lb (55.3 kg)     Height 07/30/18 0848 5\' 4"  (  1.626 m)     Head Circumference --      Peak Flow --      Pain Score --      Pain Loc --      Pain Edu? --      Excl. in [place]? --     Constitutional: Alert and oriented, looks uncomfortable but in no distress. HEENT:      Head: Normocephalic and atraumatic.         Eyes: Conjunctivae are normal. Sclera is non-icteric.       Mouth/Throat: Mucous membranes are moist.       Neck: Supple with no signs of meningismus. Cardiovascular: Regular rate and rhythm. No murmurs, gallops, or rubs. 2+ symmetrical distal pulses are present in all extremities. No JVD. Respiratory: Normal respiratory effort. Lungs are clear to auscultation bilaterally. No wheezes, crackles, or rhonchi.  Gastrointestinal: Soft, mild tenderness to palpation over the suprapubic and LLQ regions, and non distended with positive bowel sounds. No rebound or guarding. Genitourinary: No CVA tenderness. Musculoskeletal: Nontender with normal range of motion in all extremities. No edema, cyanosis, or erythema of extremities. Neurologic: Normal speech and language. Face is symmetric. Moving all extremities. No gross focal neurologic deficits are appreciated. Skin: Skin is warm, dry and intact. No rash noted. Psychiatric: Mood and affect are normal. Speech and behavior are normal.  ____________________________________________   LABS (all labs ordered are  listed, but only abnormal results are displayed)  Labs Reviewed  COMPREHENSIVE METABOLIC PANEL - Abnormal; Notable for the following components:      Result Value   Creatinine, Ser 1.02 (*)    Alkaline Phosphatase 29 (*)    GFR calc non Af Amer 55 (*)    All other components within normal limits  URINALYSIS, COMPLETE (UACMP) WITH MICROSCOPIC - Abnormal; Notable for the following components:   Color, Urine YELLOW (*)    APPearance CLEAR (*)    All other components within normal limits  CBC WITH DIFFERENTIAL/PLATELET  LIPASE, BLOOD   ____________________________________________  EKG  none  ____________________________________________  RADIOLOGY  I have personally reviewed the images performed during this visit and I agree with the Radiologist's read.   Interpretation by Radiologist:  Ct Abdomen Pelvis W Contrast  Result Date: 07/30/2018 CLINICAL DATA:  66 year old female with a history of onset lower abdominal pain EXAM: CT ABDOMEN AND PELVIS WITH CONTRAST TECHNIQUE: Multidetector CT imaging of the abdomen and pelvis was performed using the standard protocol following bolus administration of intravenous contrast. CONTRAST:  74mL OMNIPAQUE IOHEXOL 300 MG/ML  SOLN COMPARISON:  06/03/2016 FINDINGS: Lower chest: No acute abnormality. Hepatobiliary: Unremarkable appearance of liver. Unremarkable gallbladder. Pancreas: Unremarkable pancreas Spleen: Unremarkable spleen Adrenals/Urinary Tract: Unremarkable adrenal glands. Right kidney without hydronephrosis or nephrolithiasis. Unremarkable course of the right ureter. Left kidney without hydronephrosis. Nonobstructive stone of the collecting system, unchanged from the prior CT. Unremarkable course of the left ureter. Urinary bladder relatively decompressed. Stomach/Bowel: Similar appearance of sliding hiatal hernia. Small bowel without abnormal distention. No transition point of small bowel. Surgical changes of appendectomy. Large stool burden  with formed stool throughout the length of the colon. Diverticula. No evidence of focal inflammatory changes or wall thickening to indicate diverticulitis. Vascular/Lymphatic: Mild atherosclerotic changes of the abdominal aorta. No adenopathy. Reproductive: Hysterectomy Other: No ventral wall hernia. Small fat containing inguinal hernia. Musculoskeletal: No acute displaced fracture. Degenerative changes of the visualized thoracolumbar spine. IMPRESSION: No CT evidence of bowel obstruction. Large colonic stool burden, compatible with constipation, potentially fecal  impaction. Diverticular disease without evidence of acute diverticulitis. Nonobstructive left-sided nephrolithiasis. Sliding hiatal hernia. Aortic Atherosclerosis (ICD10-I70.0). Electronically Signed   By: Corrie Mckusick D.O.   On: 07/30/2018 10:42   Dg Abd 2 Views  Result Date: 07/30/2018 CLINICAL DATA:  Lower abdominal pain EXAM: ABDOMEN - 2 VIEW COMPARISON:  06/03/2016 FINDINGS: Scattered large and small bowel gas is noted. Considerable retained fecal material is noted throughout the colon consistent with constipation. No obstructive changes are seen. No free air is noted. No bony abnormality is seen. IMPRESSION: Changes of constipation. Electronically Signed   By: Inez Catalina M.D.   On: 07/30/2018 09:50      ____________________________________________   PROCEDURES  Procedure(s) performed:yes Procedures   ------------------------------------------------------------------------------------------------------------------- Fecal Disimpaction Procedure Note:  Performed by me:  Patient placed in the lateral recumbent position with knees drawn towards chest. Nurse present for patient support. Large amount of hard brown stool removed. No complications during procedure.   ------------------------------------------------------------------------------------------------------------------   Critical Care performed:   None ____________________________________________   INITIAL IMPRESSION / ASSESSMENT AND PLAN / ED COURSE   67 y.o. female with history of Addison's disease and diverticulitis who presents for evaluation of intermittent lower cramping abdominal pain associated with nausea.  Patient looks uncomfortable but she is in no distress, her abdomen is soft with mild suprapubic and left lower quadrant tenderness, her vitals are within normal limits, distal pulses are strong and present equally bilaterally, there is no palpable pulsatile mass.  Differential diagnoses including but not limited to constipation versus SBO versus diverticulitis versus volvulus versus kidney stone versus UTI.  Plan for CBC, CMP, lipase, urinalysis, KUB.  Will give morphine, Zofran and fluids for symptom relief.    _________________________ 11:22 AM on 07/30/2018 -----------------------------------------  Labs and KUB with no acute findings.  Patient continued to complain of abdominal pain and she was sent for a CT abdomen pelvis which showed constipation and fecal impaction but no signs of SBO.  Patient was disimpacted per procedure note above.  Immediately after that she had a large bowel movement with resolution of her symptoms.  Patient is good to be discharged home on senna and Colace daily, MiraLAX as needed.  Recommended probiotics and increasing fiber in her diet.  Discussed return precautions and close follow-up with primary care doctor.   As part of my medical decision making, I reviewed the following data within the [place] notes reviewed and incorporated, Labs reviewed , Old chart reviewed, Radiograph reviewed , Notes from prior ED visits and [place] Controlled Substance Database    Pertinent labs & imaging results that were available during my care of the patient were reviewed by me and considered in my medical decision making (see chart for  details).    ____________________________________________   FINAL CLINICAL IMPRESSION(S) / ED DIAGNOSES  Final diagnoses:  Lower abdominal pain  Constipation, unspecified constipation type  Fecal impaction ([place])      NEW MEDICATIONS STARTED DURING THIS VISIT:  ED Discharge Orders    None       Note:  This document was prepared using Dragon voice recognition software and may include unintentional dictation errors.    Alfred Levins, [place], MD 07/30/18 [phone]

## 2018-07-30 NOTE — ED Notes (Signed)
Patient transported to X-ray 

## 2018-07-30 NOTE — Discharge Instructions (Addendum)
Constipation: Take colace twice a day everyday. Take senna once a day at bedtime. Take daily probiotics. Drink plenty of fluids and eat a diet rich in fiber. If you go more than 3 days without a bowel movement, take 1 cap full of Miralax in the morning and one in the evening up to 5 days.  ° °

## 2018-08-23 ENCOUNTER — Other Ambulatory Visit: Payer: Self-pay | Admitting: Internal Medicine

## 2018-09-20 ENCOUNTER — Other Ambulatory Visit: Payer: Self-pay | Admitting: Internal Medicine

## 2018-11-26 ENCOUNTER — Other Ambulatory Visit: Payer: Self-pay | Admitting: Internal Medicine

## 2018-11-26 DIAGNOSIS — Z1231 Encounter for screening mammogram for malignant neoplasm of breast: Secondary | ICD-10-CM

## 2018-12-21 ENCOUNTER — Other Ambulatory Visit: Payer: Self-pay | Admitting: Internal Medicine

## 2019-02-14 ENCOUNTER — Ambulatory Visit
Admission: RE | Admit: 2019-02-14 | Discharge: 2019-02-14 | Disposition: A | Discharge status: Home / Self Care | Payer: Medicare Other | Source: Ambulatory Visit | Attending: Internal Medicine | Admitting: Internal Medicine

## 2019-02-14 DIAGNOSIS — Z1231 Encounter for screening mammogram for malignant neoplasm of breast: Secondary | ICD-10-CM | POA: Insufficient documentation

## 2019-02-23 ENCOUNTER — Telehealth: Payer: Self-pay

## 2019-02-23 DIAGNOSIS — R7301 Impaired fasting glucose: Secondary | ICD-10-CM

## 2019-02-23 DIAGNOSIS — E039 Hypothyroidism, unspecified: Secondary | ICD-10-CM

## 2019-02-23 DIAGNOSIS — E782 Mixed hyperlipidemia: Secondary | ICD-10-CM

## 2019-02-23 DIAGNOSIS — R5383 Other fatigue: Secondary | ICD-10-CM

## 2019-02-23 DIAGNOSIS — R7989 Other specified abnormal findings of blood chemistry: Secondary | ICD-10-CM

## 2019-02-23 NOTE — Telephone Encounter (Signed)
Copied from [place] [id number]. Topic: Appointment Scheduling - Scheduling Inquiry for Clinic >> Feb 23, 2019  1:16 PM Scherrie Gerlach wrote: Reason for CRM: pt has cpe on 04/13/19.  pt states she always comes in prior to her cpe for labs. Pt requested I ask the dr for there order for her to do labs in advance.

## 2019-03-10 ENCOUNTER — Other Ambulatory Visit: Payer: Self-pay | Admitting: Internal Medicine

## 2019-03-10 DIAGNOSIS — E063 Autoimmune thyroiditis: Secondary | ICD-10-CM

## 2019-03-10 NOTE — Telephone Encounter (Signed)
Pt is scheduled for Physical on 04/13/2019 and would like to get her labs done prior to visit. I ordered CBC, CMP, A1c, Lipid panel, and TSH. Is there anything else that needs to be ordered?

## 2019-03-10 NOTE — Addendum Note (Signed)
Addended by: Adair Laundry on: 03/10/2019 03:42 PM   Modules accepted: Orders

## 2019-03-10 NOTE — Progress Notes (Signed)
Free t4 added

## 2019-03-10 NOTE — Telephone Encounter (Signed)
Thanks!  Added T4 as per last year

## 2019-03-11 ENCOUNTER — Other Ambulatory Visit: Payer: Self-pay | Admitting: Internal Medicine

## 2019-03-11 DIAGNOSIS — K219 Gastro-esophageal reflux disease without esophagitis: Secondary | ICD-10-CM

## 2019-03-11 DIAGNOSIS — K222 Esophageal obstruction: Principal | ICD-10-CM

## 2019-03-15 NOTE — Telephone Encounter (Signed)
Spoke with pt to let her know that we are not scheduling lab appts due to the Covid-19 but that she will get her lab work done the day of her appt. The pt's appt is not until 2pm so the pt was advised that she could eat breakfast and then just wait to eat lunch after her appt with Dr. Derrel Nip. Pt gave a verbal understanding.

## 2019-04-05 ENCOUNTER — Encounter: Payer: Medicare Other | Admitting: Internal Medicine

## 2019-04-07 ENCOUNTER — Other Ambulatory Visit: Payer: Self-pay

## 2019-04-07 ENCOUNTER — Ambulatory Visit (INDEPENDENT_AMBULATORY_CARE_PROVIDER_SITE_OTHER): Payer: Medicare Other | Admitting: Internal Medicine

## 2019-04-07 DIAGNOSIS — K2100 Gastro-esophageal reflux disease with esophagitis, without bleeding: Secondary | ICD-10-CM

## 2019-04-07 DIAGNOSIS — Z79899 Other long term (current) drug therapy: Secondary | ICD-10-CM | POA: Diagnosis not present

## 2019-04-07 DIAGNOSIS — E271 Primary adrenocortical insufficiency: Secondary | ICD-10-CM | POA: Diagnosis not present

## 2019-04-07 DIAGNOSIS — E063 Autoimmune thyroiditis: Secondary | ICD-10-CM

## 2019-04-07 DIAGNOSIS — Z800 Family history of malignant neoplasm of digestive organs: Secondary | ICD-10-CM | POA: Diagnosis not present

## 2019-04-07 DIAGNOSIS — Z7189 Other specified counseling: Secondary | ICD-10-CM

## 2019-04-07 DIAGNOSIS — F5104 Psychophysiologic insomnia: Secondary | ICD-10-CM

## 2019-04-07 DIAGNOSIS — K317 Polyp of stomach and duodenum: Secondary | ICD-10-CM

## 2019-04-07 MED ORDER — ESTRADIOL 1 MG PO TABS: 1.0000 mg | ORAL_TABLET | Freq: Every day | ORAL | 3 refills | Status: DC

## 2019-04-07 MED ORDER — TRIAMCINOLONE ACETONIDE 0.1 % EX CREA: 1.0000 "application " | TOPICAL_CREAM | Freq: Two times a day (BID) | CUTANEOUS | 0 refills | Status: DC

## 2019-04-07 NOTE — Progress Notes (Addendum)
Virtual Visit via [email or web address]  This visit type was conducted due to national recommendations for restrictions regarding the COVID-19 pandemic (e.g. social distancing).  This format is felt to be most appropriate for this patient at this time.  All issues noted in this document were discussed and addressed.  No physical exam was performed (except for noted visual exam findings with Video Visits).   I connected with@ on 04/07/19 at  9:30 AM EDT by a video enabled telemedicine application or telephone and verified that I am speaking with the correct person using two identifiers. Location patient: home Location provider: work or home office Persons participating in the virtual visit: patient, provider  I discussed the limitations, risks, security and privacy concerns of performing an evaluation and management service by telephone and the availability of in person appointments. I also discussed with the patient that there may be a patient responsible charge related to this service. The patient expressed understanding and agreed to proceed.  Reason for visit: follow up   HPI:   68 year  old female with Addison's disease  managed with hydrocortisone and florinef. She feels generally well.  The patient has no signs or symptoms of COVID 19 infection (fever, cough and shortness of breath). She is sheltering at home.   Constipation managed  with miralax and colace daily. Uses stimulant laxative and and prn lactulose menopause managed with Estrae  GERD managed with omeprazole  Wants to wean off Remeron Colonoscopy jan 2015 [facility] .  Polyps and FH of colon ca  5 yr follow up advised and due  Esophageal stricture dilated April 2018: no symptoms currently     COVID-19 Education: The signs and symptoms of COVID-19 were discussed with the patient and how to seek care for testing (follow up with PCP or arrange E-visit).  The importance of social distancing was discussed today.  She is sheltering at home and does  not have a mask to use when out other than the one she uses to mow the lawn.   DEXA [facility] 2015  Spine -1.6   ROS: See pertinent positives and negatives per HPI.  Past Medical History:  Diagnosis Date  . Addison's disease ([place])   . Arthritis    fingers  . Cancer ([place])    skin  . Chicken pox   . Colon polyps   . Diverticulitis   . GERD (gastroesophageal reflux disease)     Past Surgical History:  Procedure Laterality Date  . ABDOMINAL HYSTERECTOMY    . APPENDECTOMY    . BREAST BIOPSY Right 2010   neg  . BREAST SURGERY    . ESOPHAGOGASTRODUODENOSCOPY (EGD) WITH PROPOFOL N/A 04/10/2017   Procedure: ESOPHAGOGASTRODUODENOSCOPY (EGD) WITH PROPOFOL;  Surgeon: Lucilla Lame, MD;  Location: [facility];  Service: Endoscopy;  Laterality: N/A;  requeests early as possible  . FOOT SURGERY Bilateral   . varicose veins      Family History  Problem Relation Age of Onset  . Cancer Mother 40       Breast Ca  colon Ca (78)  and brain Ca (27)  . Breast cancer Mother 27  . Cancer Father   . Cancer Maternal Aunt        breast  . Breast cancer Maternal Aunt   . Cancer Maternal Grandmother        breast  . Breast cancer Maternal Grandmother   . Cancer Maternal Aunt        breast ca  . Breast cancer Maternal  Aunt     SOCIAL HX: married., no tobacco use. Moderate alcohol use.    Current Outpatient Medications:  .  ALPRAZolam (XANAX) 1 MG tablet, Take 1 tablet (1 mg total) by mouth at bedtime as needed for anxiety. For refill on or after Dec 07 2016, Disp: 30 tablet, Rfl: 5 .  aspirin 81 MG tablet, Take 81 mg by mouth once a week. , Disp: , Rfl:  .  calcium citrate (CALCITRATE - DOSED IN MG ELEMENTAL CALCIUM) 950 MG tablet, Take 1 tablet by mouth daily., Disp: , Rfl:  .  Cholecalciferol (VITAMIN D3) 1000 UNITS CAPS, Take 1 capsule by mouth daily., Disp: , Rfl:  .  estradiol (ESTRACE) 1 MG tablet, Take 1 tablet (1 mg total) by mouth daily., Disp: 90 tablet, Rfl: 3 .  fludrocortisone  (FLORINEF) 0.1 MG tablet, TAKE ONE TABLET BY MOUTH EVERY DAY, Disp: 90 tablet, Rfl: 1 .  hydrocortisone (CORTEF) 10 MG tablet, TAKE TWO TABLETS BY MOUTH EVERY MORNING AND TAKE ONE TABLET BY MOUTH EVERY EVENING AS DIRECTED BY PHYSICIAN, Disp: 270 tablet, Rfl: 3 .  ipratropium (ATROVENT) 0.06 % nasal spray, USE TWO SPRAYS INTO EACH NOSTRIL FOUR TIMES A DAY, Disp: 15 mL, Rfl: 2 .  Lactulose 20 GM/30ML SOLN, 30 ml every 4 hours until constipation is relieved, Disp: 236 mL, Rfl: 3 .  Multiple Vitamins-Minerals (MULTIVITAMIN WITH MINERALS) tablet, Take 1 tablet by mouth daily., Disp: , Rfl:  .  omeprazole (PRILOSEC) 40 MG capsule, TAKE 1 CAPSULE EVERY DAY, Disp: 90 capsule, Rfl: 1 .  polyethylene glycol powder (GLYCOLAX/MIRALAX) powder, USE AS DIRECTED, Disp: 527 g, Rfl: 1 .  promethazine (PHENERGAN) 12.5 MG tablet, Take 1 tablet (12.5 mg total) by mouth every 6 (six) hours as needed for nausea or vomiting., Disp: 30 tablet, Rfl: 0 .  vitamin C (ASCORBIC ACID) 500 MG tablet, Take 500 mg by mouth daily., Disp: , Rfl:  .  triamcinolone cream (KENALOG) 0.1 %, Apply 1 application topically 2 (two) times daily., Disp: 453.6 g, Rfl: 0  EXAM:  VITALS per patient if applicable:  GENERAL: alert, oriented, appears well and in no acute distress  HEENT: atraumatic, conjunttiva clear, no obvious abnormalities on inspection of external nose and ears  NECK: normal movements of the head and neck  LUNGS: on inspection no signs of respiratory distress, breathing rate appears normal, no obvious gross SOB, gasping or wheezing  CV: no obvious cyanosis  MS: moves all visible extremities without noticeable abnormality  PSYCH/NEURO: pleasant and cooperative, no obvious depression or anxiety, speech and thought processing grossly intact  ASSESSMENT AND PLAN:  Discussed the following assessment and plan:  Family history of colon cancer requiring screening colonoscopy - Plan: Ambulatory referral to  Gastroenterology  Long-term use of high-risk medication - Plan: Lipid panel  Addison's disease ([place]) - Plan: Comprehensive metabolic panel, TSH  Gastric polyp - Plan: Vitamin B12  Acquired autoimmune hypothyroidism  Chronic insomnia  Reflux esophagitis  Educated About Covid-19 Virus Infection  Family history of colon cancer requiring screening colonoscopy 5 yr follow up with Dr Allen Norris is due. Referral made  Acquired autoimmune hypothyroidism TSH  Was therapeutic in may 2019 and has been reordered    Lab Results  Component Value Date   TSH 2.48 05/12/2018     Chronic insomnia No longer taking remeron .  Alprazolam prn use The risks and benefits of benzodiazepine use were discussed with patient today including excessive sedation leading to respiratory depression,  impaired thinking/driving, and  addiction.  Patient was advised to avoid concurrent use with alcohol, to use medication only as needed and not to share with others  . discussed.   Reflux esophagitis Using omeprazole daily.  No symptoms currently   Addison's disease ([place]) Managed with hydrocortisone and florinef  daily  Educated About Covid-19 Virus Infection COVID-19 Education: The signs and symptoms of COVID-19 were discussed with the patient and how to seek care for testing (follow up with PCP or arrange E-visit).  The importance of social distancing was discussed today    I discussed the assessment and treatment plan with the patient. The patient was provided an opportunity to ask questions and all were answered. The patient agreed with the plan and demonstrated an understanding of the instructions.   The patient was advised to call back or seek an in-person evaluation if the symptoms worsen or if the condition fails to improve as anticipated.  I provided 25 minutes of non-face-to-face time during this encounter.   Crecencio Mc, MD

## 2019-04-07 NOTE — Patient Instructions (Signed)
I'm glad to see you so relaxed!  Fasting labs have been ordered and can be done when you feel comfortable leaving your house  You can schedule the lab through the front office  And stay in your car until the lab is ready for you  Triamcinolone TUB refilled for prn use  You can now stop the Remeron.  If your insomnia returns,  We can resume it at the lowest dose of 7.5 mg    Referral to Dr Allen Norris for your colonoscopy has been made  Janett Billow will call you about the masks for you and FD

## 2019-04-09 DIAGNOSIS — Z7189 Other specified counseling: Secondary | ICD-10-CM | POA: Insufficient documentation

## 2019-04-09 NOTE — Assessment & Plan Note (Signed)
Using omeprazole daily.  No symptoms currently

## 2019-04-09 NOTE — Assessment & Plan Note (Signed)
Managed with hydrocortisone and florinef  daily

## 2019-04-09 NOTE — Assessment & Plan Note (Signed)
5 yr follow up with Dr Allen Norris is due. Referral made

## 2019-04-09 NOTE — Assessment & Plan Note (Signed)
COVID-19 Education: The signs and symptoms of COVID-19 were discussed with the patient and how to seek care for testing (follow up with PCP or arrange E-visit).  The importance of social distancing was discussed today 

## 2019-04-09 NOTE — Assessment & Plan Note (Signed)
No longer taking remeron .  Alprazolam prn use The risks and benefits of benzodiazepine use were discussed with patient today including excessive sedation leading to respiratory depression,  impaired thinking/driving, and addiction.  Patient was advised to avoid concurrent use with alcohol, to use medication only as needed and not to share with others  . discussed.

## 2019-04-09 NOTE — Assessment & Plan Note (Signed)
TSH  Was therapeutic in may 2019 and has been reordered    Lab Results  Component Value Date   TSH 2.48 05/12/2018

## 2019-04-13 ENCOUNTER — Encounter: Payer: Medicare Other | Admitting: Internal Medicine

## 2019-04-25 ENCOUNTER — Other Ambulatory Visit: Payer: Self-pay | Admitting: Internal Medicine

## 2019-04-25 MED ORDER — MIRTAZAPINE 7.5 MG PO TABS: ORAL_TABLET | ORAL | 2 refills | Status: DC

## 2019-04-25 NOTE — Progress Notes (Signed)
ta

## 2019-05-10 ENCOUNTER — Other Ambulatory Visit: Payer: Self-pay | Admitting: Internal Medicine

## 2019-05-30 ENCOUNTER — Other Ambulatory Visit: Payer: Self-pay

## 2019-05-31 ENCOUNTER — Encounter: Payer: Medicare Other | Admitting: Internal Medicine

## 2019-05-31 ENCOUNTER — Other Ambulatory Visit: Payer: Self-pay

## 2019-06-09 ENCOUNTER — Encounter: Payer: Self-pay | Admitting: Internal Medicine

## 2019-06-09 ENCOUNTER — Ambulatory Visit (INDEPENDENT_AMBULATORY_CARE_PROVIDER_SITE_OTHER): Payer: Medicare Other | Admitting: Internal Medicine

## 2019-06-09 ENCOUNTER — Other Ambulatory Visit: Payer: Self-pay

## 2019-06-09 VITALS — BP 98/66 | HR 83 | Temp 98.2°F | Resp 14 | Ht 64.0 in | Wt 126.8 lb

## 2019-06-09 DIAGNOSIS — E271 Primary adrenocortical insufficiency: Secondary | ICD-10-CM

## 2019-06-09 DIAGNOSIS — K5909 Other constipation: Secondary | ICD-10-CM | POA: Diagnosis not present

## 2019-06-09 DIAGNOSIS — N951 Menopausal and female climacteric states: Secondary | ICD-10-CM

## 2019-06-09 DIAGNOSIS — Z800 Family history of malignant neoplasm of digestive organs: Secondary | ICD-10-CM | POA: Diagnosis not present

## 2019-06-09 DIAGNOSIS — F5104 Psychophysiologic insomnia: Secondary | ICD-10-CM

## 2019-06-09 DIAGNOSIS — G2581 Restless legs syndrome: Secondary | ICD-10-CM

## 2019-06-09 DIAGNOSIS — Z0000 Encounter for general adult medical examination without abnormal findings: Secondary | ICD-10-CM

## 2019-06-09 DIAGNOSIS — G454 Transient global amnesia: Secondary | ICD-10-CM

## 2019-06-09 MED ORDER — ALPRAZOLAM 1 MG PO TABS: 1.0000 mg | ORAL_TABLET | Freq: Every evening | ORAL | 0 refills | Status: DC | PRN

## 2019-06-09 MED ORDER — DICLOFENAC SODIUM 1 % TD GEL: 2.0000 g | Freq: Four times a day (QID) | TRANSDERMAL | 1 refills | Status: DC

## 2019-06-09 MED ORDER — MIRTAZAPINE 30 MG PO TABS: ORAL_TABLET | ORAL | 1 refills | Status: DC

## 2019-06-09 NOTE — Patient Instructions (Signed)
Your leg issues sound like "restless legs."  This can be treated with medication if it becomes obstrusive,  But for now let's make sure your iron is not low   I have ordered fasting labs to be done at your convenience,  And have refilled the alprazolam and remero for future use   Health Maintenance for Postmenopausal Women Menopause is a normal process in which your reproductive ability comes to an end. This process happens gradually over a span of months to years, usually between the ages of 52 and 80. Menopause is complete when you have missed 12 consecutive menstrual periods. It is important to talk with your health care provider about some of the most common conditions that affect postmenopausal women, such as heart disease, cancer, and bone loss (osteoporosis). Adopting a healthy lifestyle and getting preventive care can help to promote your health and wellness. Those actions can also lower your chances of developing some of these common conditions. What should I know about menopause? During menopause, you may experience a number of symptoms, such as:  Moderate-to-severe hot flashes.  Night sweats.  Decrease in sex drive.  Mood swings.  Headaches.  Tiredness.  Irritability.  Memory problems.  Insomnia. Choosing to treat or not to treat menopausal changes is an individual decision that you make with your health care provider. What should I know about hormone replacement therapy and supplements? Hormone therapy products are effective for treating symptoms that are associated with menopause, such as hot flashes and night sweats. Hormone replacement carries certain risks, especially as you become older. If you are thinking about using estrogen or estrogen with progestin treatments, discuss the benefits and risks with your health care provider. What should I know about heart disease and stroke? Heart disease, heart attack, and stroke become more likely as you age. This may be due, in  part, to the hormonal changes that your body experiences during menopause. These can affect how your body processes dietary fats, triglycerides, and cholesterol. Heart attack and stroke are both medical emergencies. There are many things that you can do to help prevent heart disease and stroke:  Have your blood pressure checked at least every 1-2 years. High blood pressure causes heart disease and increases the risk of stroke.  If you are 16-25 years old, ask your health care provider if you should take aspirin to prevent a heart attack or a stroke.  Do not use any tobacco products, including cigarettes, chewing tobacco, or electronic cigarettes. If you need help quitting, ask your health care provider.  It is important to eat a healthy diet and maintain a healthy weight. ? Be sure to include plenty of vegetables, fruits, low-fat dairy products, and lean protein. ? Avoid eating foods that are high in solid fats, added sugars, or salt (sodium).  Get regular exercise. This is one of the most important things that you can do for your health. ? Try to exercise for at least 150 minutes each week. The type of exercise that you do should increase your heart rate and make you sweat. This is known as moderate-intensity exercise. ? Try to do strengthening exercises at least twice each week. Do these in addition to the moderate-intensity exercise.  Know your numbers.Ask your health care provider to check your cholesterol and your blood glucose. Continue to have your blood tested as directed by your health care provider.  What should I know about cancer screening? There are several types of cancer. Take the following steps to reduce  your risk and to catch any cancer development as early as possible. Breast Cancer  Practice breast self-awareness. ? This means understanding how your breasts normally appear and feel. ? It also means doing regular breast self-exams. Let your health care provider know about  any changes, no matter how small.  If you are 9 or older, have a clinician do a breast exam (clinical breast exam or CBE) every year. Depending on your age, family history, and medical history, it may be recommended that you also have a yearly breast X-ray (mammogram).  If you have a family history of breast cancer, talk with your health care provider about genetic screening.  If you are at high risk for breast cancer, talk with your health care provider about having an MRI and a mammogram every year.  Breast cancer (BRCA) gene test is recommended for women who have family members with BRCA-related cancers. Results of the assessment will determine the need for genetic counseling and BRCA1 and for BRCA2 testing. BRCA-related cancers include these types: ? Breast. This occurs in males or females. ? Ovarian. ? Tubal. This may also be called fallopian tube cancer. ? Cancer of the abdominal or pelvic lining (peritoneal cancer). ? Prostate. ? Pancreatic. Cervical, Uterine, and Ovarian Cancer Your health care provider may recommend that you be screened regularly for cancer of the pelvic organs. These include your ovaries, uterus, and vagina. This screening involves a pelvic exam, which includes checking for microscopic changes to the surface of your cervix (Pap test).  For women ages 21-65, health care providers may recommend a pelvic exam and a Pap test every three years. For women ages 71-65, they may recommend the Pap test and pelvic exam, combined with testing for human papilloma virus (HPV), every five years. Some types of HPV increase your risk of cervical cancer. Testing for HPV may also be done on women of any age who have unclear Pap test results.  Other health care providers may not recommend any screening for nonpregnant women who are considered low risk for pelvic cancer and have no symptoms. Ask your health care provider if a screening pelvic exam is right for you.  If you have had past  treatment for cervical cancer or a condition that could lead to cancer, you need Pap tests and screening for cancer for at least 20 years after your treatment. If Pap tests have been discontinued for you, your risk factors (such as having a new sexual partner) need to be reassessed to determine if you should start having screenings again. Some women have medical problems that increase the chance of getting cervical cancer. In these cases, your health care provider may recommend that you have screening and Pap tests more often.  If you have a family history of uterine cancer or ovarian cancer, talk with your health care provider about genetic screening.  If you have vaginal bleeding after reaching menopause, tell your health care provider.  There are currently no reliable tests available to screen for ovarian cancer. Lung Cancer Lung cancer screening is recommended for adults 35-67 years old who are at high risk for lung cancer because of a history of smoking. A yearly low-dose CT scan of the lungs is recommended if you:  Currently smoke.  Have a history of at least 30 pack-years of smoking and you currently smoke or have quit within the past 15 years. A pack-year is smoking an average of one pack of cigarettes per day for one year. Yearly screening should:  Continue until it has been 15 years since you quit.  Stop if you develop a health problem that would prevent you from having lung cancer treatment. Colorectal Cancer  This type of cancer can be detected and can often be prevented.  Routine colorectal cancer screening usually begins at age 37 and continues through age 39.  If you have risk factors for colon cancer, your health care provider may recommend that you be screened at an earlier age.  If you have a family history of colorectal cancer, talk with your health care provider about genetic screening.  Your health care provider may also recommend using home test kits to check for  hidden blood in your stool.  A small camera at the end of a tube can be used to examine your colon directly (sigmoidoscopy or colonoscopy). This is done to check for the earliest forms of colorectal cancer.  Direct examination of the colon should be repeated every 5-10 years until age 59. However, if early forms of precancerous polyps or small growths are found or if you have a family history or genetic risk for colorectal cancer, you may need to be screened more often. Skin Cancer  Check your skin from head to toe regularly.  Monitor any moles. Be sure to tell your health care provider: ? About any new moles or changes in moles, especially if there is a change in a mole's shape or color. ? If you have a mole that is larger than the size of a pencil eraser.  If any of your family members has a history of skin cancer, especially at a young age, talk with your health care provider about genetic screening.  Always use sunscreen. Apply sunscreen liberally and repeatedly throughout the day.  Whenever you are outside, protect yourself by wearing long sleeves, pants, a wide-brimmed hat, and sunglasses. What should I know about osteoporosis? Osteoporosis is a condition in which bone destruction happens more quickly than new bone creation. After menopause, you may be at an increased risk for osteoporosis. To help prevent osteoporosis or the bone fractures that can happen because of osteoporosis, the following is recommended:  If you are 35-86 years old, get at least 1,000 mg of calcium and at least 600 mg of vitamin D per day.  If you are older than age 71 but younger than age 40, get at least 1,200 mg of calcium and at least 600 mg of vitamin D per day.  If you are older than age 48, get at least 1,200 mg of calcium and at least 800 mg of vitamin D per day. Smoking and excessive alcohol intake increase the risk of osteoporosis. Eat foods that are rich in calcium and vitamin D, and do weight-bearing  exercises several times each week as directed by your health care provider. What should I know about how menopause affects my mental health? Depression may occur at any age, but it is more common as you become older. Common symptoms of depression include:  Low or sad mood.  Changes in sleep patterns.  Changes in appetite or eating patterns.  Feeling an overall lack of motivation or enjoyment of activities that you previously enjoyed.  Frequent crying spells. Talk with your health care provider if you think that you are experiencing depression. What should I know about immunizations? It is important that you get and maintain your immunizations. These include:  Tetanus, diphtheria, and pertussis (Tdap) booster vaccine.  Influenza every year before the flu season begins.  Pneumonia  vaccine.  Shingles vaccine. Your health care provider may also recommend other immunizations. This information is not intended to replace advice given to you by your health care provider. Make sure you discuss any questions you have with your health care provider. Document Released: 01/30/2006 Document Revised: 06/27/2016 Document Reviewed: 09/11/2015 Elsevier Interactive Patient Education  2019 [employer].

## 2019-06-09 NOTE — Progress Notes (Signed)
Patient ID: Veronica Ferrell, female    DOB: 16-Feb-1951  Age: 68 y.o. MRN: [id number]  The patient is here for annual preventive  examination and management of other chronic and acute problems.  Mammogram normal feb 2020  The risk factors are reflected in the social history.  The roster of all physicians providing medical care to patient - is listed in the Snapshot section of the chart.  Activities of daily living:  The patient is 100% independent in all ADLs: dressing, toileting, feeding as well as independent mobility  Home safety : The patient has smoke detectors in the home. They wear seatbelts.  There are no firearms at home. There is no violence in the home.   There is no risks for hepatitis, STDs or HIV. There is no   history of blood transfusion. They have no travel history to infectious disease endemic areas of the world.  The patient has seen their dentist in the last six month. They have seen their eye doctor in the last year.  She denies hearing difficulty and has  deferred audiologic testing in the last year.  She does not  have excessive sun exposure. Discussed the need for sun protection: hats, long sleeves and use of sunscreen if there is significant sun exposure.   Diet: the importance of a healthy diet is discussed. She has a a healthy diet.  The benefits of regular aerobic exercise were discussed. She walks 4 times per week ,  60 minutes.   Depression screen: there are no signs or vegative symptoms of depression- irritability, change in appetite, anhedonia, sadness/tearfullness.  Cognitive assessment: the patient manages all their financial and personal affairs and is actively engaged. They could relate day,date,year and events; recalled 2/3 objects at 3 minutes; performed clock-face test normally.  The following portions of the patient's history were reviewed and updated as appropriate: allergies, current medications, past family history, past medical history,  past  surgical history, past social history  and problem list.  Visual acuity was not assessed per patient preference since she has regular follow up with her ophthalmologist. Hearing and body mass index were assessed and reviewed.   During the course of the visit the patient was educated and counseled about appropriate screening and preventive services including : fall prevention , diabetes screening, nutrition counseling, colorectal cancer screening, and recommended immunizations.    CC: The primary encounter diagnosis was Restless legs. Diagnoses of Chronic constipation, Chronic insomnia, Family history of colon cancer requiring screening colonoscopy, Menopause syndrome, Transient global amnesia, Addison's disease ([place]), and Encounter for preventive health examination were also pertinent to this visit.  She feels generally well.  She is taking 15 mg remeron at bedtime for management of insomnia.    Some restless legs symtpoms occasionally at night.    She is due for 5  yr follow up colonoscopy; referral to [facility] was  made In April    History Ashling has a past medical history of Addison's disease ([place]), Arthritis, Cancer ([place]), Chicken pox, Colon polyps, Diverticulitis, and GERD (gastroesophageal reflux disease).   She has a past surgical history that includes varicose veins; Breast surgery; Appendectomy; Abdominal hysterectomy; Foot surgery (Bilateral); Esophagogastroduodenoscopy (egd) with propofol (N/A, 04/10/2017); and Breast biopsy (Right, 2010).   Her family history includes Breast cancer in her maternal aunt, maternal aunt, and maternal grandmother; Breast cancer (age of onset: 19) in her mother; Cancer in her father, maternal aunt, maternal aunt, and maternal grandmother; Cancer (age of onset: 68) in her  mother.She reports that she has never smoked. She has never used smokeless tobacco. She reports current alcohol use of about 7.0 standard drinks of alcohol per week. She reports that she does not  use drugs.  Outpatient Medications Prior to Visit  Medication Sig Dispense Refill  . aspirin 81 MG tablet Take 81 mg by mouth once a week.     . calcium citrate (CALCITRATE - DOSED IN MG ELEMENTAL CALCIUM) 950 MG tablet Take 1 tablet by mouth daily.    . Cholecalciferol (VITAMIN D3) 1000 UNITS CAPS Take 1 capsule by mouth daily.    Marland Kitchen estradiol (ESTRACE) 1 MG tablet Take 1 tablet (1 mg total) by mouth daily. 90 tablet 3  . fludrocortisone (FLORINEF) 0.1 MG tablet TAKE ONE TABLET BY MOUTH EVERY DAY 90 tablet 1  . hydrocortisone (CORTEF) 10 MG tablet TAKE TWO TABLETS EACH MORNING AND ONE TABLET EVERY NIGHT AS DIRECTED 270 tablet 3  . ipratropium (ATROVENT) 0.06 % nasal spray USE TWO SPRAYS INTO EACH NOSTRIL FOUR TIMES A DAY 15 mL 2  . Lactulose 20 GM/30ML SOLN 30 ml every 4 hours until constipation is relieved 236 mL 3  . Multiple Vitamins-Minerals (MULTIVITAMIN WITH MINERALS) tablet Take 1 tablet by mouth daily.    Marland Kitchen omeprazole (PRILOSEC) 40 MG capsule TAKE 1 CAPSULE EVERY DAY 90 capsule 1  . polyethylene glycol powder (GLYCOLAX/MIRALAX) powder USE AS DIRECTED 527 g 1  . promethazine (PHENERGAN) 12.5 MG tablet Take 1 tablet (12.5 mg total) by mouth every 6 (six) hours as needed for nausea or vomiting. 30 tablet 0  . triamcinolone cream (KENALOG) 0.1 % Apply 1 application topically 2 (two) times daily. 453.6 g 0  . vitamin C (ASCORBIC ACID) 500 MG tablet Take 500 mg by mouth daily.    Marland Kitchen ALPRAZolam (XANAX) 1 MG tablet Take 1 tablet (1 mg total) by mouth at bedtime as needed for anxiety. For refill on or after Dec 07 2016 30 tablet 5  . mirtazapine (REMERON) 7.5 MG tablet 1/2 to 1 tablet 30 minutes before bedtime daily 60 tablet 2   No facility-administered medications prior to visit.     Review of Systems   Patient denies headache, fevers, malaise, unintentional weight loss, skin rash, eye pain, sinus congestion and sinus pain, sore throat, dysphagia,  hemoptysis , cough, dyspnea, wheezing,  chest pain, palpitations, orthopnea, edema, abdominal pain, nausea, melena, diarrhea, constipation, flank pain, dysuria, hematuria, urinary  Frequency, nocturia, numbness, tingling, seizures,  Focal weakness, Loss of consciousness,  Tremor, insomnia, depression, anxiety, and suicidal ideation.      Objective:  BP 98/66 (BP Location: Left Arm, Patient Position: Sitting, Cuff Size: Normal)   Pulse 83   Temp 98.2 F (36.8 C) (Oral)   Resp 14   Ht 5\' 4"  (1.626 m)   Wt 126 lb 12.8 oz (57.5 kg)   SpO2 97%   BMI 21.77 kg/m   Physical Exam   General appearance: alert, cooperative and appears stated age Head: Normocephalic, without obvious abnormality, atraumatic Eyes: conjunctivae/corneas clear. PERRL, EOM's intact. Fundi benign. Ears: normal TM's and external ear canals both ears Nose: Nares normal. Septum midline. Mucosa normal. No drainage or sinus tenderness. Throat: lips, mucosa, and tongue normal; teeth and gums normal Neck: no adenopathy, no carotid bruit, no JVD, supple, symmetrical, trachea midline and thyroid not enlarged, symmetric, no tenderness/mass/nodules Lungs: clear to auscultation bilaterally Breasts: normal appearance, no masses or tenderness Heart: regular rate and rhythm, S1, S2 normal, no murmur, click, rub or  gallop Abdomen: soft, non-tender; bowel sounds normal; no masses,  no organomegaly Extremities: extremities normal, atraumatic, no cyanosis or edema Pulses: 2+ and symmetric Skin: Skin color, texture, turgor normal. No rashes or lesions Neurologic: Alert and oriented X 3, normal strength and tone. Normal symmetric reflexes. Normal coordination and gait.      Assessment & Plan:   Problem List Items Addressed This Visit    Addison's disease ([place])    Managed with hydrocortisone and florinef  Daily.        Chronic constipation    Now managed with daily miralax and stool softener,  Prn lactulose      Chronic insomnia    She has resumed taking remeron .   Alprazolam prn use  Has been minimized       Encounter for preventive health examination    age appropriate education and counseling updated, referrals for preventative services and immunizations addressed, dietary and smoking counseling addressed, most recent labs reviewed.  I have personally reviewed and have noted:  1) the patient's medical and social history 2) The pt's use of alcohol, tobacco, and illicit drugs 3) The patient's current medications and supplements 4) Functional ability including ADL's, fall risk, home safety risk, hearing and visual impairment 5) Diet and physical activities 6) Evidence for depression or mood disorder 7) The patient's height, weight, and BMI have been recorded in the chart  I have made referrals, and provided counseling and education based on review of the above      Family history of colon cancer requiring screening colonoscopy    She has been referred to Lucilla Lame for 5 yr follow up       Menopause syndrome    She remains symptomatic without HRT and is willing to continue Estradiol out of pocket      Transient global amnesia    She has had no subsequent events in 5 years on aspirin alone.   .       Other Visit Diagnoses    Restless legs    -  Primary   Relevant Orders   Iron, TIBC and Ferritin Panel      I have changed Chauncey Cruel. Thibeaux "Janeann Zeta Fulop"'s mirtazapine and ALPRAZolam. I am also having her start on diclofenac sodium. Additionally, I am having her maintain her aspirin, Vitamin D3, calcium citrate, multivitamin with minerals, vitamin C, polyethylene glycol powder, promethazine, Lactulose, fludrocortisone, ipratropium, omeprazole, triamcinolone cream, estradiol, and hydrocortisone.  Meds ordered this encounter  Medications  . mirtazapine (REMERON) 30 MG tablet    Sig: 1/2 to 1 tablet 30 minutes before bedtime daily    Dispense:  90 tablet    Refill:  1    KEEP ON FILE FOR FUTURE REFILLS  . diclofenac sodium  (VOLTAREN) 1 % GEL    Sig: Apply 2 g topically 4 (four) times daily.    Dispense:  150 g    Refill:  1  . ALPRAZolam (XANAX) 1 MG tablet    Sig: Take 1 tablet (1 mg total) by mouth at bedtime as needed for anxiety.    Dispense:  30 tablet    Refill:  0    Medications Discontinued During This Encounter  Medication Reason  . mirtazapine (REMERON) 7.5 MG tablet   . ALPRAZolam (XANAX) 1 MG tablet Reorder    Follow-up: No follow-ups on file.   Crecencio Mc, MD

## 2019-06-11 NOTE — Assessment & Plan Note (Signed)
She has been referred to Veronica Ferrell for 5 yr follow up

## 2019-06-11 NOTE — Assessment & Plan Note (Signed)
She remains symptomatic without HRT and is willing to continue Estradiol out of pocket

## 2019-06-11 NOTE — Assessment & Plan Note (Signed)
She has had no subsequent events in 5 years on aspirin alone.   Marland Kitchen

## 2019-06-11 NOTE — Assessment & Plan Note (Signed)

## 2019-06-11 NOTE — Assessment & Plan Note (Signed)
Now managed with daily miralax and stool softener,  Prn lactulose

## 2019-06-11 NOTE — Assessment & Plan Note (Signed)
She has resumed taking remeron .  Alprazolam prn use  Has been minimized

## 2019-06-11 NOTE — Assessment & Plan Note (Signed)
Managed with hydrocortisone and florinef  Daily.

## 2019-06-20 ENCOUNTER — Ambulatory Visit (INDEPENDENT_AMBULATORY_CARE_PROVIDER_SITE_OTHER): Payer: Medicare Other

## 2019-06-20 ENCOUNTER — Other Ambulatory Visit: Payer: Self-pay

## 2019-06-20 DIAGNOSIS — Z0000 Encounter for general adult medical examination without abnormal findings: Secondary | ICD-10-CM | POA: Diagnosis not present

## 2019-06-20 NOTE — Progress Notes (Addendum)
Subjective:   Veronica Ferrell is a 68 y.o. female who presents for Medicare Annual (Subsequent) preventive examination.  Review of Systems:  No ROS.  Medicare Wellness Virtual Visit.  Visual/audio telehealth visit, UTA vital signs.   See social history for additional risk factors.   Cardiac Risk Factors include: advanced age (>98men, >66 women)     Objective:     Vitals: There were no vitals taken for this visit.  There is no height or weight on file to calculate BMI.  Advanced Directives 06/20/2019 07/30/2018 06/17/2018 04/10/2017  Does Patient Have a Medical Advance Directive? Yes No Yes Yes  Type of Paramedic of [place];Living will - [place];Living will [place]  Does patient want to make changes to medical advance directive? No - Patient declined - No - Patient declined No - Patient declined  Copy of [place] in Chart? No - copy requested - No - copy requested No - copy requested    Tobacco Social History   Tobacco Use  Smoking Status Never Smoker  Smokeless Tobacco Never Used     Counseling given: Not Answered   Clinical Intake:  Pre-visit preparation completed: Yes        Diabetes: No  How often do you need to have someone help you when you read instructions, pamphlets, or other written materials from your doctor or pharmacy?: 1 - Never  Interpreter Needed?: No     Past Medical History:  Diagnosis Date  . Addison's disease ([place])   . Arthritis    fingers  . Cancer ([place])    skin  . Chicken pox   . Colon polyps   . Diverticulitis   . GERD (gastroesophageal reflux disease)    Past Surgical History:  Procedure Laterality Date  . ABDOMINAL HYSTERECTOMY    . APPENDECTOMY    . BREAST BIOPSY Right 2010   neg  . BREAST SURGERY    . ESOPHAGOGASTRODUODENOSCOPY (EGD) WITH PROPOFOL N/A 04/10/2017   Procedure: ESOPHAGOGASTRODUODENOSCOPY (EGD) WITH PROPOFOL;  Surgeon:  Lucilla Lame, MD;  Location: [facility];  Service: Endoscopy;  Laterality: N/A;  requeests early as possible  . FOOT SURGERY Bilateral   . varicose veins     Family History  Problem Relation Age of Onset  . Cancer Mother 3       Breast Ca  colon Ca (54)  and brain Ca (57)  . Breast cancer Mother 96  . Cancer Father   . Cancer Maternal Aunt        breast  . Breast cancer Maternal Aunt   . Cancer Maternal Grandmother        breast  . Breast cancer Maternal Grandmother   . Cancer Maternal Aunt        breast ca  . Breast cancer Maternal Aunt    Social History   Socioeconomic History  . Marital status: Married    Spouse name: Not on file  . Number of children: Not on file  . Years of education: Not on file  . Highest education level: Not on file  Occupational History  . Not on file  Social Needs  . Financial resource strain: Not hard at all  . Food insecurity    Worry: Never true    Inability: Never true  . Transportation needs    Medical: No    Non-medical: No  Tobacco Use  . Smoking status: Never Smoker  . Smokeless tobacco:  Never Used  Substance and Sexual Activity  . Alcohol use: Yes    Alcohol/week: 7.0 standard drinks    Types: 7 Glasses of wine per week    Comment: WINE EACH WEEK  . Drug use: No  . Sexual activity: Yes  Lifestyle  . Physical activity    Days per week: 5 days    Minutes per session: 60 min  . Stress: Not at all  Relationships  . Social Herbalist on phone: Not on file    Gets together: Not on file    Attends religious service: Not on file    Active member of club or organization: Not on file    Attends meetings of clubs or organizations: Not on file    Relationship status: Not on file  Other Topics Concern  . Not on file  Social History Narrative  . Not on file    Outpatient Encounter Medications as of 06/20/2019  Medication Sig  . ALPRAZolam (XANAX) 1 MG tablet Take 1 tablet (1 mg total) by mouth at bedtime as  needed for anxiety.  Marland Kitchen aspirin 81 MG tablet Take 81 mg by mouth once a week.   . calcium citrate (CALCITRATE - DOSED IN MG ELEMENTAL CALCIUM) 950 MG tablet Take 1 tablet by mouth daily.  . Cholecalciferol (VITAMIN D3) 1000 UNITS CAPS Take 1 capsule by mouth daily.  . diclofenac sodium (VOLTAREN) 1 % GEL Apply 2 g topically 4 (four) times daily.  Marland Kitchen estradiol (ESTRACE) 1 MG tablet Take 1 tablet (1 mg total) by mouth daily.  . fludrocortisone (FLORINEF) 0.1 MG tablet TAKE ONE TABLET BY MOUTH EVERY DAY  . hydrocortisone (CORTEF) 10 MG tablet TAKE TWO TABLETS EACH MORNING AND ONE TABLET EVERY NIGHT AS DIRECTED  . ipratropium (ATROVENT) 0.06 % nasal spray USE TWO SPRAYS INTO EACH NOSTRIL FOUR TIMES A DAY  . Lactulose 20 GM/30ML SOLN 30 ml every 4 hours until constipation is relieved  . mirtazapine (REMERON) 30 MG tablet 1/2 to 1 tablet 30 minutes before bedtime daily  . Multiple Vitamins-Minerals (MULTIVITAMIN WITH MINERALS) tablet Take 1 tablet by mouth daily.  Marland Kitchen omeprazole (PRILOSEC) 40 MG capsule TAKE 1 CAPSULE EVERY DAY  . polyethylene glycol powder (GLYCOLAX/MIRALAX) powder USE AS DIRECTED  . promethazine (PHENERGAN) 12.5 MG tablet Take 1 tablet (12.5 mg total) by mouth every 6 (six) hours as needed for nausea or vomiting.  . triamcinolone cream (KENALOG) 0.1 % Apply 1 application topically 2 (two) times daily.  . vitamin C (ASCORBIC ACID) 500 MG tablet Take 500 mg by mouth daily.   No facility-administered encounter medications on file as of 06/20/2019.     Activities of Daily Living In your present state of health, do you have any difficulty performing the following activities: 06/20/2019  Hearing? N  Vision? N  Difficulty concentrating or making decisions? N  Walking or climbing stairs? N  Dressing or bathing? N  Doing errands, shopping? N  Preparing Food and eating ? N  Using the Toilet? N  In the past six months, have you accidently leaked urine? N  Do you have problems with loss of  bowel control? N  Managing your Medications? N  Managing your Finances? N  Housekeeping or managing your Housekeeping? N  Some recent data might be hidden    Patient Care Team: Crecencio Mc, MD as PCP - General (Internal Medicine)    Assessment:   This is a routine wellness examination for [facility].  I  connected with patient 06/20/19 at 11:30 AM EDT by a video/audio enabled telemedicine application and verified that I am speaking with the correct person using two identifiers. Patient stated full name and DOB. Patient gave permission to continue with virtual visit. Patient's location was at home and Nurse's location was at [place] office.   Health Screenings  Mammogram - 01/2019 Colonoscopy - 12/2013 Bone Density - 09/2014 Glaucoma -none Hearing -demonstrates normal hearing during visit. Labs followed by pcp Cholesterol - 03/2018 Dental- every 6 months Vision- visits within the last 12 months. Fasting labs scheduled.  Social  Alcohol intake - yes, 7 glasses wine per week  Smoking history- never    Smokers in home? none Illicit drug use? none Exercise - walking, pilates, strength/weight bearing exercises  5 days  weekly, 60 minutes Diet - healthy Sexually Active -yes BMI- discussed the importance of a healthy diet, water intake and the benefits of aerobic exercise.  Educational material provided.   Safety  Patient feels safe at home- yes Patient does have smoke detectors at home- yes Patient does wear sunscreen or protective clothing when in direct sunlight -yes Patient does wear seat belt when in a moving vehicle -yes Patient drives- yes  [id number] precautions and sickness symptoms discussed.   Activities of Daily Living Patient denies needing assistance with: driving, household chores, feeding themselves, getting from bed to chair, getting to the toilet, bathing/showering, dressing, managing money, or preparing meals.  No new identified risk were noted.    Depression  Screen Patient denies losing interest in daily life, feeling hopeless, or crying easily over simple problems.   Medication-taking as directed and without issues.   Fall Screen Patient denies being afraid of falling or falling in the last year.   Memory Screen Patient is alert.  Patient denies difficulty focusing, concentrating or misplacing items. Correctly identified the president of the [place] , season and recall. Patient likes to read and complete sodoku for brain stimulation.  Immunizations The following Immunizations were discussed: Influenza, shingles, pneumonia, and tetanus.   Other Providers Patient Care Team: Crecencio Mc, MD as PCP - General (Internal Medicine)  Exercise Activities and Dietary recommendations Current Exercise Habits: Home exercise routine, Type of exercise: strength training/weights;walking;calisthenics, Time (Minutes): 60, Frequency (Times/Week): 5, Weekly Exercise (Minutes/Week): 300, Intensity: Moderate  Goals      Patient Stated   . Maintain Healthy Lifestyle (pt-stated)     Healthy diet Stay active Stay hydrated Follow up with pcp as needed       Fall Risk Fall Risk  06/20/2019 06/17/2018 03/30/2018 03/25/2017 01/30/2016  Falls in the past year? 0 No No No No   Depression Screen PHQ 2/9 Scores 06/20/2019 06/17/2018 03/25/2017 01/30/2016  PHQ - 2 Score 0 0 0 0     Cognitive Function MMSE - Mini Mental State Exam 06/17/2018  Orientation to time 5  Orientation to Place 5  Registration 3  Attention/ Calculation 5  Recall 3  Language- name 2 objects 2  Language- repeat 1  Language- follow 3 step command 3  Language- read & follow direction 1  Write a sentence 1  Copy design 1  Total score 30     6CIT Screen 06/20/2019  What Year? 0 points  What month? 0 points  What time? 0 points  Count back from 20 0 points  Months in reverse 0 points  Repeat phrase 0 points  Total Score 0    Immunization History  Administered Date(s) Administered   . Influenza  Split 09/21/2013, 09/19/2015, 08/30/2017  . Influenza,inj,Quad PF,6+ Mos 09/11/2014  . Pneumococcal Conjugate-13 09/11/2014  . Pneumococcal Polysaccharide-23 03/08/2012, 03/30/2018  . Tdap 03/09/2011  . Zoster 03/08/2012  . Zoster Recombinat (Shingrix) 08/24/2018, 11/19/2018   Screening Tests Health Maintenance  Topic Date Due  . INFLUENZA VACCINE  07/23/2019  . MAMMOGRAM  02/15/2020  . TETANUS/TDAP  03/08/2021  . COLONOSCOPY  01/09/2024  . DEXA SCAN  Completed  . Hepatitis C Screening  Completed  . PNA vac Low Risk Adult  Completed      Plan:    End of life planning; Advance aging; Advanced directives discussed.  Copy of current HCPOA/Living Will requested.    I have personally reviewed and noted the following in the patient's chart:   . Medical and social history . Use of alcohol, tobacco or illicit drugs  . Current medications and supplements . Functional ability and status . Nutritional status . Physical activity . Advanced directives . List of other physicians . Hospitalizations, surgeries, and ER visits in previous 12 months . Vitals . Screenings to include cognitive, depression, and falls . Referrals and appointments  In addition, I have reviewed and discussed with patient certain preventive protocols, quality metrics, and best practice recommendations. A written personalized care plan for preventive services as well as general preventive health recommendations were provided to patient.     OBrien-Blaney, Yuka Lallier L, LPN  07/06/9677    I have reviewed the above information and agree with above.   Deborra Medina, MD

## 2019-06-20 NOTE — Patient Instructions (Addendum)
  Veronica Ferrell , Thank you for taking time to come for your Medicare Wellness Visit. I appreciate your ongoing commitment to your health goals. Please review the following plan we discussed and let me know if I can assist you in the future.   These are the goals we discussed: Goals      Patient Stated   . Maintain Healthy Lifestyle (pt-stated)     Healthy diet Stay active Stay hydrated Follow up with pcp as needed       This is a list of the screening recommended for you and due dates:  Health Maintenance  Topic Date Due  . Flu Shot  07/23/2019  . Mammogram  02/15/2020  . Tetanus Vaccine  03/08/2021  . Colon Cancer Screening  01/09/2024  . DEXA scan (bone density measurement)  Completed  .  Hepatitis C: One time screening is recommended by Center for Disease Control  (CDC) for  adults born from [birth year] through 1967.   Completed  . Pneumonia vaccines  Completed

## 2019-06-27 ENCOUNTER — Other Ambulatory Visit (INDEPENDENT_AMBULATORY_CARE_PROVIDER_SITE_OTHER): Payer: Medicare Other

## 2019-06-27 ENCOUNTER — Other Ambulatory Visit: Payer: Self-pay

## 2019-06-27 DIAGNOSIS — E271 Primary adrenocortical insufficiency: Secondary | ICD-10-CM | POA: Diagnosis not present

## 2019-06-27 DIAGNOSIS — E039 Hypothyroidism, unspecified: Secondary | ICD-10-CM | POA: Diagnosis not present

## 2019-06-27 DIAGNOSIS — K317 Polyp of stomach and duodenum: Secondary | ICD-10-CM

## 2019-06-27 DIAGNOSIS — R5383 Other fatigue: Secondary | ICD-10-CM | POA: Diagnosis not present

## 2019-06-27 DIAGNOSIS — E063 Autoimmune thyroiditis: Secondary | ICD-10-CM

## 2019-06-27 DIAGNOSIS — R7301 Impaired fasting glucose: Secondary | ICD-10-CM

## 2019-06-27 DIAGNOSIS — G2581 Restless legs syndrome: Secondary | ICD-10-CM

## 2019-06-27 DIAGNOSIS — Z79899 Other long term (current) drug therapy: Secondary | ICD-10-CM | POA: Diagnosis not present

## 2019-06-27 LAB — COMPREHENSIVE METABOLIC PANEL
ALT: 11 U/L (ref 0–35)
AST: 14 U/L (ref 0–37)
Albumin: 4.3 g/dL (ref 3.5–5.2)
Alkaline Phosphatase: 26 U/L — ABNORMAL LOW (ref 39–117)
BUN: 21 mg/dL (ref 6–23)
CO2: 27 mEq/L (ref 19–32)
Calcium: 9 mg/dL (ref 8.4–10.5)
Chloride: 98 mEq/L (ref 96–112)
Creatinine, Ser: 0.85 mg/dL (ref 0.40–1.20)
GFR: 66.08 mL/min (ref >60.00)
Glucose, Bld: 89 mg/dL (ref 70–99)
Potassium: 4.6 mEq/L (ref 3.5–5.1)
Sodium: 133 mEq/L — ABNORMAL LOW (ref 135–145)
Total Bilirubin: 0.5 mg/dL (ref 0.2–1.2)
Total Protein: 6.6 g/dL (ref 6.0–8.3)

## 2019-06-27 LAB — LIPID PANEL
Cholesterol: 211 mg/dL — ABNORMAL HIGH (ref 0–200)
HDL: 91.2 mg/dL (ref >39.00)
LDL Cholesterol: 101 mg/dL — ABNORMAL HIGH (ref 0–99)
NonHDL: 120.23
Total CHOL/HDL Ratio: 2
Triglycerides: 94 mg/dL (ref 0.0–149.0)
VLDL: 18.8 mg/dL (ref 0.0–40.0)

## 2019-06-27 LAB — HEMOGLOBIN A1C: Hgb A1c MFr Bld: 6 % (ref 4.6–6.5)

## 2019-06-27 LAB — T4, FREE: Free T4: 0.85 ng/dL (ref 0.60–1.60)

## 2019-06-27 LAB — CBC WITH DIFFERENTIAL/PLATELET
Basophils Absolute: 0.1 10*3/uL (ref 0.0–0.1)
Basophils Relative: 0.8 % (ref 0.0–3.0)
Eosinophils Absolute: 0.1 10*3/uL (ref 0.0–0.7)
Eosinophils Relative: 1.4 % (ref 0.0–5.0)
HCT: 42.8 % (ref 36.0–46.0)
Hemoglobin: 14.1 g/dL (ref 12.0–15.0)
Lymphocytes Relative: 47.3 % — ABNORMAL HIGH (ref 12.0–46.0)
Lymphs Abs: 3.4 10*3/uL (ref 0.7–4.0)
MCHC: 33 g/dL (ref 30.0–36.0)
MCV: 99.8 fl (ref 78.0–100.0)
Monocytes Absolute: 0.5 10*3/uL (ref 0.1–1.0)
Monocytes Relative: 6.6 % (ref 3.0–12.0)
Neutro Abs: 3.1 10*3/uL (ref 1.4–7.7)
Neutrophils Relative %: 43.9 % (ref 43.0–77.0)
Platelets: 291 10*3/uL (ref 150.0–400.0)
RBC: 4.28 Mil/uL (ref 3.87–5.11)
RDW: 13.2 % (ref 11.5–15.5)
WBC: 7.1 10*3/uL (ref 4.0–10.5)

## 2019-06-27 LAB — VITAMIN B12: Vitamin B-12: 1382 pg/mL — ABNORMAL HIGH (ref 211–911)

## 2019-06-27 LAB — TSH: TSH: 3.25 u[IU]/mL (ref 0.35–4.50)

## 2019-06-28 ENCOUNTER — Encounter: Payer: Self-pay | Admitting: *Deleted

## 2019-06-28 ENCOUNTER — Other Ambulatory Visit: Payer: Self-pay

## 2019-06-28 ENCOUNTER — Telehealth: Payer: Self-pay | Admitting: Gastroenterology

## 2019-06-28 LAB — IRON,TIBC AND FERRITIN PANEL
%SAT: 38 % (calc) (ref 16–45)
Ferritin: 158 ng/mL (ref 16–288)
Iron: 123 ug/dL (ref 45–160)
TIBC: 325 mcg/dL (calc) (ref 250–450)

## 2019-06-28 MED ORDER — NA SULFATE-K SULFATE-MG SULF 17.5-3.13-1.6 GM/177ML PO SOLN: 1.0000 | Freq: Once | ORAL | 0 refills | Status: AC

## 2019-06-28 NOTE — Telephone Encounter (Signed)
Patient called & l/m on v/m stating Sharyn Lull called her & l/m since then she's called her # twice with no response today called [place] # with hopes of a call back.

## 2019-06-29 ENCOUNTER — Other Ambulatory Visit: Payer: Self-pay

## 2019-06-29 DIAGNOSIS — Z800 Family history of malignant neoplasm of digestive organs: Secondary | ICD-10-CM

## 2019-06-29 DIAGNOSIS — Z86010 Personal history of colonic polyps: Secondary | ICD-10-CM

## 2019-06-29 DIAGNOSIS — Z8371 Family history of colonic polyps: Secondary | ICD-10-CM

## 2019-06-29 MED ORDER — NA SULFATE-K SULFATE-MG SULF 17.5-3.13-1.6 GM/177ML PO SOLN: 1.0000 | Freq: Once | ORAL | 0 refills | Status: AC

## 2019-06-29 NOTE — Telephone Encounter (Signed)
Gastroenterology Pre-Procedure Review  Request Date: 07/14/19 Requesting Physician: Dr. Allen Norris  PATIENT REVIEW QUESTIONS: The patient responded to the following health history questions as indicated:    1. Are you having any GI issues? Takes Miralax to help with bowel emptying 2. Do you have a personal history of Polyps? yes (5 years ago) 3. Do you have a family history of Colon Cancer or Polyps? yes (mother polyps, and colon cancer) 4. Diabetes Mellitus? no 5. Joint replacements in the past 12 months?no 6. Major health problems in the past 3 months?no 7. Any artificial heart valves, MVP, or defibrillator?no    MEDICATIONS & ALLERGIES:    Patient reports the following regarding taking any anticoagulation/antiplatelet therapy:   Plavix, Coumadin, Eliquis, Xarelto, Lovenox, Pradaxa, Brilinta, or Effient? no Aspirin? yes (81 mg daily)  Patient confirms/reports the following medications:  Current Outpatient Medications  Medication Sig Dispense Refill  . ALPRAZolam (XANAX) 1 MG tablet Take 1 tablet (1 mg total) by mouth at bedtime as needed for anxiety. 30 tablet 0  . aspirin 81 MG tablet Take 81 mg by mouth once a week.     . calcium citrate (CALCITRATE - DOSED IN MG ELEMENTAL CALCIUM) 950 MG tablet Take 1 tablet by mouth daily.    . Cholecalciferol (VITAMIN D3) 1000 UNITS CAPS Take 1 capsule by mouth daily.    . diclofenac sodium (VOLTAREN) 1 % GEL Apply 2 g topically 4 (four) times daily. 150 g 1  . estradiol (ESTRACE) 1 MG tablet Take 1 tablet (1 mg total) by mouth daily. 90 tablet 3  . fludrocortisone (FLORINEF) 0.1 MG tablet TAKE ONE TABLET BY MOUTH EVERY DAY 90 tablet 1  . hydrocortisone (CORTEF) 10 MG tablet TAKE TWO TABLETS EACH MORNING AND ONE TABLET EVERY NIGHT AS DIRECTED 270 tablet 3  . ipratropium (ATROVENT) 0.06 % nasal spray USE TWO SPRAYS INTO EACH NOSTRIL FOUR TIMES A DAY 15 mL 2  . Lactulose 20 GM/30ML SOLN 30 ml every 4 hours until constipation is relieved 236 mL 3  .  mirtazapine (REMERON) 30 MG tablet 1/2 to 1 tablet 30 minutes before bedtime daily 90 tablet 1  . Multiple Vitamins-Minerals (MULTIVITAMIN WITH MINERALS) tablet Take 1 tablet by mouth daily.    Marland Kitchen omeprazole (PRILOSEC) 40 MG capsule TAKE 1 CAPSULE EVERY DAY 90 capsule 1  . polyethylene glycol powder (GLYCOLAX/MIRALAX) powder USE AS DIRECTED 527 g 1  . promethazine (PHENERGAN) 12.5 MG tablet Take 1 tablet (12.5 mg total) by mouth every 6 (six) hours as needed for nausea or vomiting. 30 tablet 0  . triamcinolone cream (KENALOG) 0.1 % Apply 1 application topically 2 (two) times daily. 453.6 g 0  . vitamin C (ASCORBIC ACID) 500 MG tablet Take 500 mg by mouth daily.     No current facility-administered medications for this visit.     Patient confirms/reports the following allergies:  Allergies  Allergen Reactions  . Alendronate Sodium Nausea Only  . Boniva [Ibandronic Acid] Nausea And Vomiting  . Clindamycin/Lincomycin Dermatitis  . Tramadol   . Penicillins Rash    No orders of the defined types were placed in this encounter.   AUTHORIZATION INFORMATION Primary Insurance: 1D#: Group #:  Secondary Insurance: 1D#: Group #:  SCHEDULE INFORMATION: Date: 07/14/19 Time: Location:[facility]

## 2019-07-07 ENCOUNTER — Other Ambulatory Visit: Payer: Self-pay

## 2019-07-07 ENCOUNTER — Encounter: Payer: Self-pay | Admitting: *Deleted

## 2019-07-11 ENCOUNTER — Other Ambulatory Visit
Admission: RE | Admit: 2019-07-11 | Discharge: 2019-07-11 | Disposition: A | Discharge status: Home / Self Care | Payer: Medicare Other | Source: Ambulatory Visit | Attending: Gastroenterology | Admitting: Gastroenterology

## 2019-07-11 ENCOUNTER — Other Ambulatory Visit: Payer: Self-pay

## 2019-07-11 DIAGNOSIS — Z1159 Encounter for screening for other viral diseases: Secondary | ICD-10-CM | POA: Insufficient documentation

## 2019-07-11 LAB — SARS CORONAVIRUS 2 (TAT 6-24 HRS): SARS Coronavirus 2: NEGATIVE

## 2019-07-13 NOTE — Discharge Instructions (Signed)

## 2019-07-13 NOTE — Anesthesia Preprocedure Evaluation (Addendum)
Anesthesia Evaluation  Patient identified by MRN, date of birth, ID band Patient awake    Reviewed: Allergy & Precautions, H&P , NPO status , Patient's Chart, lab work & pertinent test results, reviewed documented beta blocker date and time   History of Anesthesia Complications Negative for: history of anesthetic complications  Airway Mallampati: II  TM Distance: >3 FB Neck ROM: full    Dental  (+) Dental Advidsory Given   Pulmonary neg pulmonary ROS,    Pulmonary exam normal        Cardiovascular Exercise Tolerance: Good negative cardio ROS Normal cardiovascular exam     Neuro/Psych negative neurological ROS  negative psych ROS   GI/Hepatic Neg liver ROS, GERD  ,  Endo/Other  Hypothyroidism Addison's disease  Renal/GU negative Renal ROS  negative genitourinary   Musculoskeletal  (+) Arthritis ,   Abdominal   Peds  Hematology negative hematology ROS (+)   Anesthesia Other Findings Past Medical History: No date: Addison's disease ([place]) No date: Arthritis     Comment:  fingers No date: Cancer ([place])     Comment:  skin No date: Chicken pox No date: Colon polyps No date: Diverticulitis No date: GERD (gastroesophageal reflux disease)   Reproductive/Obstetrics negative OB ROS                            Anesthesia Physical Anesthesia Plan  ASA: II  Anesthesia Plan: General   Post-op Pain Management:    Induction: Intravenous  PONV Risk Score and Plan: 3 and Propofol infusion and TIVA  Airway Management Planned: Natural Airway and Nasal Cannula  Additional Equipment:   Intra-op Plan:   Post-operative Plan:   Informed Consent: I have reviewed the patients History and Physical, chart, labs and discussed the procedure including the risks, benefits and alternatives for the proposed anesthesia with the patient or authorized representative who has indicated his/her understanding  and acceptance.     Dental Advisory Given  Plan Discussed with: Anesthesiologist, CRNA and Surgeon  Anesthesia Plan Comments:        Anesthesia Quick Evaluation

## 2019-07-14 ENCOUNTER — Encounter: Payer: Self-pay | Admitting: Anesthesiology

## 2019-07-14 ENCOUNTER — Ambulatory Visit
Admission: RE | Admit: 2019-07-14 | Discharge: 2019-07-14 | Disposition: A | Discharge status: Home / Self Care | Payer: Medicare Other | Attending: Gastroenterology | Admitting: Gastroenterology

## 2019-07-14 ENCOUNTER — Other Ambulatory Visit: Payer: Self-pay

## 2019-07-14 ENCOUNTER — Encounter
Admission: RE | Disposition: A | Discharge status: Home / Self Care | Payer: Self-pay | Source: Home / Self Care | Attending: Gastroenterology

## 2019-07-14 DIAGNOSIS — Z1211 Encounter for screening for malignant neoplasm of colon: Secondary | ICD-10-CM | POA: Insufficient documentation

## 2019-07-14 DIAGNOSIS — Z79899 Other long term (current) drug therapy: Secondary | ICD-10-CM | POA: Insufficient documentation

## 2019-07-14 DIAGNOSIS — Z79890 Hormone replacement therapy (postmenopausal): Secondary | ICD-10-CM | POA: Diagnosis not present

## 2019-07-14 DIAGNOSIS — K635 Polyp of colon: Secondary | ICD-10-CM | POA: Diagnosis not present

## 2019-07-14 DIAGNOSIS — Z85828 Personal history of other malignant neoplasm of skin: Secondary | ICD-10-CM | POA: Insufficient documentation

## 2019-07-14 DIAGNOSIS — Z800 Family history of malignant neoplasm of digestive organs: Secondary | ICD-10-CM

## 2019-07-14 DIAGNOSIS — Z7982 Long term (current) use of aspirin: Secondary | ICD-10-CM | POA: Diagnosis not present

## 2019-07-14 DIAGNOSIS — Z860100 Personal history of colon polyps, unspecified: Secondary | ICD-10-CM

## 2019-07-14 DIAGNOSIS — D124 Benign neoplasm of descending colon: Secondary | ICD-10-CM | POA: Insufficient documentation

## 2019-07-14 DIAGNOSIS — K5730 Diverticulosis of large intestine without perforation or abscess without bleeding: Secondary | ICD-10-CM | POA: Diagnosis not present

## 2019-07-14 DIAGNOSIS — Z8371 Family history of colonic polyps: Secondary | ICD-10-CM

## 2019-07-14 DIAGNOSIS — K641 Second degree hemorrhoids: Secondary | ICD-10-CM | POA: Insufficient documentation

## 2019-07-14 DIAGNOSIS — K219 Gastro-esophageal reflux disease without esophagitis: Secondary | ICD-10-CM | POA: Insufficient documentation

## 2019-07-14 DIAGNOSIS — D122 Benign neoplasm of ascending colon: Secondary | ICD-10-CM | POA: Diagnosis not present

## 2019-07-14 HISTORY — PX: COLONOSCOPY WITH PROPOFOL: SHX5780

## 2019-07-14 SURGERY — COLONOSCOPY WITH PROPOFOL
Anesthesia: General

## 2019-07-14 MED ORDER — PHENYLEPHRINE HCL (PRESSORS) 10 MG/ML IV SOLN
INTRAVENOUS | Status: DC | PRN
  Administered 2019-07-14: 100 ug via INTRAVENOUS

## 2019-07-14 MED ORDER — ACETAMINOPHEN 160 MG/5ML PO SOLN
325.0000 mg | ORAL | Status: DC | PRN
  Filled 2019-07-14: qty 20.3

## 2019-07-14 MED ORDER — PROPOFOL 10 MG/ML IV BOLUS
INTRAVENOUS | Status: DC | PRN
  Administered 2019-07-14: 50 mg via INTRAVENOUS

## 2019-07-14 MED ORDER — ONDANSETRON HCL 4 MG/2ML IJ SOLN: 4.0000 mg | Freq: Once | INTRAMUSCULAR | Status: DC | PRN

## 2019-07-14 MED ORDER — PROPOFOL 500 MG/50ML IV EMUL
INTRAVENOUS | Status: DC | PRN
  Administered 2019-07-14: 125 ug/kg/min via INTRAVENOUS

## 2019-07-14 MED ORDER — SODIUM CHLORIDE 0.9 % IV SOLN
INTRAVENOUS | Status: DC
  Administered 2019-07-14: 08:00:00 1000 mL via INTRAVENOUS

## 2019-07-14 MED ORDER — ACETAMINOPHEN 325 MG PO TABS: 650.0000 mg | ORAL_TABLET | Freq: Once | ORAL | Status: DC | PRN

## 2019-07-14 NOTE — Transfer of Care (Signed)
Immediate Anesthesia Transfer of Care Note  Patient: Chauncey Cruel Lenart  Procedure(s) Performed: COLONOSCOPY WITH PROPOFOL (N/A )  Patient Location: PACU  Anesthesia Type:General  Level of Consciousness: awake and sedated  Airway & Oxygen Therapy: Patient Spontanous Breathing and Patient connected to nasal cannula oxygen  Post-op Assessment: Report given to RN and Post -op Vital signs reviewed and stable  Post vital signs: Reviewed and stable  Last Vitals:  Vitals Value Taken Time  BP    Temp    Pulse 72 07/14/19 0900  Resp 16 07/14/19 0900  SpO2 98 % 07/14/19 0900  Vitals shown include unvalidated device data.  Last Pain:  Vitals:   07/14/19 0806  TempSrc: Tympanic  PainSc: 0-No pain         Complications: No apparent anesthesia complications

## 2019-07-14 NOTE — H&P (Signed)
Lucilla Lame, MD [address] [place], [place] [address] Phone:[phone] Fax : [phone]  Primary Care Physician:  Crecencio Mc, MD Primary Gastroenterologist:  Dr. Allen Norris  Pre-Procedure History & Physical: HPI:  Veronica Ferrell is a 67 y.o. female is here for an colonoscopy.   Past Medical History:  Diagnosis Date  . Addison's disease ([place])   . Arthritis    fingers  . Cancer ([place])    skin  . Chicken pox   . Colon polyps   . Diverticulitis   . GERD (gastroesophageal reflux disease)     Past Surgical History:  Procedure Laterality Date  . ABDOMINAL HYSTERECTOMY    . APPENDECTOMY    . BREAST BIOPSY Right 2010   neg  . BREAST SURGERY    . ESOPHAGOGASTRODUODENOSCOPY (EGD) WITH PROPOFOL N/A 04/10/2017   Procedure: ESOPHAGOGASTRODUODENOSCOPY (EGD) WITH PROPOFOL;  Surgeon: Lucilla Lame, MD;  Location: [facility];  Service: Endoscopy;  Laterality: N/A;  requeests early as possible  . FOOT SURGERY Bilateral   . varicose veins      Prior to Admission medications   Medication Sig Start Date End Date Taking? Authorizing Provider  ALPRAZolam Duanne Moron) 1 MG tablet Take 1 tablet (1 mg total) by mouth at bedtime as needed for anxiety. 06/09/19  Yes Crecencio Mc, MD  aspirin 81 MG tablet Take 81 mg by mouth once a week.    Yes [provider]  calcium citrate (CALCITRATE - DOSED IN MG ELEMENTAL CALCIUM) 950 MG tablet Take 1 tablet by mouth daily.   Yes [provider]  Cholecalciferol (VITAMIN D3) 1000 UNITS CAPS Take 1 capsule by mouth daily.   Yes [provider]  diclofenac sodium (VOLTAREN) 1 % GEL Apply 2 g topically 4 (four) times daily. 06/09/19  Yes Crecencio Mc, MD  estradiol (ESTRACE) 1 MG tablet Take 1 tablet (1 mg total) by mouth daily. 04/07/19  Yes Crecencio Mc, MD  fludrocortisone (FLORINEF) 0.1 MG tablet TAKE ONE TABLET BY MOUTH EVERY DAY 08/24/18  Yes Crecencio Mc, MD  hydrocortisone (CORTEF) 10 MG tablet  TAKE TWO TABLETS EACH MORNING AND ONE TABLET EVERY NIGHT AS DIRECTED 05/11/19  Yes Crecencio Mc, MD  ipratropium (ATROVENT) 0.06 % nasal spray USE TWO SPRAYS INTO EACH NOSTRIL FOUR TIMES A DAY 03/11/19  Yes Crecencio Mc, MD  Lactulose 20 GM/30ML SOLN 30 ml every 4 hours until constipation is relieved 07/30/18  Yes Crecencio Mc, MD  mirtazapine (REMERON) 30 MG tablet 1/2 to 1 tablet 30 minutes before bedtime daily 06/09/19  Yes Crecencio Mc, MD  Multiple Vitamins-Minerals (MULTIVITAMIN WITH MINERALS) tablet Take 1 tablet by mouth daily.   Yes [provider]  omeprazole (PRILOSEC) 40 MG capsule TAKE 1 CAPSULE EVERY DAY 03/11/19  Yes Crecencio Mc, MD  polyethylene glycol powder (GLYCOLAX/MIRALAX) powder USE AS DIRECTED 02/04/18  Yes Crecencio Mc, MD  triamcinolone cream (KENALOG) 0.1 % Apply 1 application topically 2 (two) times daily. 04/07/19  Yes Crecencio Mc, MD  vitamin C (ASCORBIC ACID) 500 MG tablet Take 500 mg by mouth daily.   Yes [provider]  promethazine (PHENERGAN) 12.5 MG tablet Take 1 tablet (12.5 mg total) by mouth every 6 (six) hours as needed for nausea or vomiting. Patient not taking: Reported on 07/07/2019 03/30/18   Crecencio Mc, MD    Allergies as of 06/29/2019 - Review Complete 06/20/2019  Allergen Reaction Noted  . Alendronate  sodium Nausea Only 09/06/2015  . Boniva [ibandronic acid] Nausea And Vomiting 07/06/2013  . Clindamycin/lincomycin Dermatitis 07/06/2013  . Tramadol  11/04/2017  . Penicillins Rash 07/06/2013    Family History  Problem Relation Age of Onset  . Cancer Mother 74       Breast Ca  colon Ca (35)  and brain Ca (79)  . Breast cancer Mother 4  . Cancer Father   . Cancer Maternal Aunt        breast  . Breast cancer Maternal Aunt   . Cancer Maternal Grandmother        breast  . Breast cancer Maternal Grandmother   . Cancer Maternal Aunt        breast ca  . Breast cancer Maternal Aunt     Social History    Socioeconomic History  . Marital status: Married    Spouse name: Not on file  . Number of children: Not on file  . Years of education: Not on file  . Highest education level: Not on file  Occupational History  . Not on file  Social Needs  . Financial resource strain: Not hard at all  . Food insecurity    Worry: Never true    Inability: Never true  . Transportation needs    Medical: No    Non-medical: No  Tobacco Use  . Smoking status: Never Smoker  . Smokeless tobacco: Never Used  Substance and Sexual Activity  . Alcohol use: Yes    Alcohol/week: 7.0 standard drinks    Types: 7 Glasses of wine per week    Comment: WINE EACH WEEK  . Drug use: No  . Sexual activity: Yes  Lifestyle  . Physical activity    Days per week: 5 days    Minutes per session: 60 min  . Stress: Not at all  Relationships  . Social Herbalist on phone: Not on file    Gets together: Not on file    Attends religious service: Not on file    Active member of club or organization: Not on file    Attends meetings of clubs or organizations: Not on file    Relationship status: Not on file  . Intimate partner violence    Fear of current or ex partner: No    Emotionally abused: No    Physically abused: No    Forced sexual activity: No  Other Topics Concern  . Not on file  Social History Narrative  . Not on file    Review of Systems: See HPI, otherwise negative ROS  Physical Exam: Ht 5\' 4"  (1.626 m)   Wt 55.3 kg   BMI 20.94 kg/m  General:   Alert,  pleasant and cooperative in NAD Head:  Normocephalic and atraumatic. Neck:  Supple; no masses or thyromegaly. Lungs:  Clear throughout to auscultation.    Heart:  Regular rate and rhythm. Abdomen:  Soft, nontender and nondistended. Normal bowel sounds, without guarding, and without rebound.   Neurologic:  Alert and  oriented x4;  grossly normal neurologically.  Impression/Plan: Veronica Ferrell is here for an colonoscopy to be performed  for history of colon polyps.  Risks, benefits, limitations, and alternatives regarding  colonoscopy have been reviewed with the patient.  Questions have been answered.  All parties agreeable.   Lucilla Lame, MD  07/14/2019, 8:08 AM

## 2019-07-14 NOTE — Anesthesia Postprocedure Evaluation (Signed)
Anesthesia Post Note  Patient: Veronica Ferrell  Procedure(s) Performed: COLONOSCOPY WITH PROPOFOL (N/A )  Patient location during evaluation: Endoscopy Anesthesia Type: General Level of consciousness: awake and alert Pain management: pain level controlled Vital Signs Assessment: post-procedure vital signs reviewed and stable Respiratory status: spontaneous breathing, nonlabored ventilation, respiratory function stable and patient connected to nasal cannula oxygen Cardiovascular status: blood pressure returned to baseline and stable Postop Assessment: no apparent nausea or vomiting Anesthetic complications: no     Last Vitals:  Vitals:   07/14/19 0924 07/14/19 0930  BP:    Pulse: 70 75  Resp: 15 11  Temp:    SpO2: 100% 100%    Last Pain:  Vitals:   07/14/19 0806  TempSrc: Tympanic  PainSc: 0-No pain                 Martha Clan

## 2019-07-14 NOTE — Op Note (Signed)
[facility] Gastroenterology Patient Name: Veronica Ferrell Procedure Date: 07/14/2019 8:04 AM MRN: [id number] Account #: [id number] Date of Birth: 1951/12/06 Admit Type: Outpatient Age: 68 Room: [facility] ENDO ROOM 4 Gender: Female Note Status: Finalized Procedure:            Colonoscopy Indications:          High risk colon cancer surveillance: Personal history                        of colonic polyps 05/2014 Providers:            Lucilla Lame MD, MD Referring MD:         Deborra Medina, MD (Referring MD) Medicines:            Propofol per Anesthesia Complications:        No immediate complications. Procedure:            Pre-Anesthesia Assessment:                       - Prior to the procedure, a History and Physical was                        performed, and patient medications and allergies were                        reviewed. The patient's tolerance of previous                        anesthesia was also reviewed. The risks and benefits of                        the procedure and the sedation options and risks were                        discussed with the patient. All questions were                        answered, and informed consent was obtained. Prior                        Anticoagulants: The patient has taken no previous                        anticoagulant or antiplatelet agents. ASA Grade                        Assessment: II - A patient with mild systemic disease.                        After reviewing the risks and benefits, the patient was                        deemed in satisfactory condition to undergo the                        procedure.                       After obtaining informed consent, the colonoscope was  passed under direct vision. Throughout the procedure,                        the patient's blood pressure, pulse, and oxygen                        saturations were monitored continuously. The   Colonoscope was introduced through the anus and                        advanced to the the cecum, identified by appendiceal                        orifice and ileocecal valve. The colonoscopy was                        performed without difficulty. The patient tolerated the                        procedure well. The quality of the bowel preparation                        was excellent. Findings:      The perianal and digital rectal examinations were normal.      Two sessile polyps were found in the ascending colon. The polyps were 4       to 5 mm in size. These polyps were removed with a cold snare. Resection       and retrieval were complete.      A 5 mm polyp was found in the descending colon. The polyp was sessile.       The polyp was removed with a cold snare. Resection and retrieval were       complete.      Multiple small-mouthed diverticula were found in the entire colon.      Non-bleeding internal hemorrhoids were found during retroflexion. The       hemorrhoids were Grade II (internal hemorrhoids that prolapse but reduce       spontaneously). Impression:           - Two 4 to 5 mm polyps in the ascending colon, removed                        with a cold snare. Resected and retrieved.                       - One 5 mm polyp in the descending colon, removed with                        a cold snare. Resected and retrieved.                       - Diverticulosis in the entire examined colon.                       - Non-bleeding internal hemorrhoids. Recommendation:       - Discharge patient to home.                       - Resume previous diet.                       -  Continue present medications.                       - Await pathology results.                       - Repeat colonoscopy in 5 years for surveillance. Procedure Code(s):    --- Professional ---                       [phone], Colonoscopy, flexible; with removal of tumor(s),                        polyp(s), or other  lesion(s) by snare technique Diagnosis Code(s):    --- Professional ---                       Z86.010, Personal history of colonic polyps                       K63.5, Polyp of colon CPT copyright 2019 American Medical Association. All rights reserved. The codes documented in this report are preliminary and upon coder review may  be revised to meet current compliance requirements. Lucilla Lame MD, MD 07/14/2019 8:59:41 AM This report has been signed electronically. Number of Addenda: 0 Note Initiated On: 07/14/2019 8:04 AM Scope Withdrawal Time: 0 hours 8 minutes 34 seconds  Total Procedure Duration: 0 hours 24 minutes 29 seconds  Estimated Blood Loss: Estimated blood loss: none.      [facility]

## 2019-07-14 NOTE — Anesthesia Procedure Notes (Signed)
Date/Time: 07/14/2019 8:34 AM Performed by: Nelda Marseille, CRNA Pre-anesthesia Checklist: Patient identified, Emergency Drugs available, Suction available, Patient being monitored and Timeout performed Oxygen Delivery Method: Nasal cannula

## 2019-07-14 NOTE — Anesthesia Post-op Follow-up Note (Signed)
Anesthesia QCDR form completed.        

## 2019-07-15 ENCOUNTER — Encounter: Payer: Self-pay | Admitting: Gastroenterology

## 2019-07-15 LAB — SURGICAL PATHOLOGY

## 2019-08-23 ENCOUNTER — Telehealth: Payer: Self-pay | Admitting: Internal Medicine

## 2019-08-23 NOTE — Telephone Encounter (Signed)
    To whom it may concern:  The above mentioned patient has physical and/or  psychiatric conditions that are treated in part with regular participation in aerobic exercise and weight training .  I consider regular exercise  to be vital to the maintenance of his/her wellbeing and am recommending that he/she be allowed to return to regular workouts at your facility as long as the appropriate social distancing and other measures needed to minimize transmission or infection with the COVID 19 virus are followed.     Sincerely,    Crecencio Mc, MD

## 2019-08-23 NOTE — Telephone Encounter (Signed)
Sent this one via mychart:  To whom it may concern:  The above mentioned patient has physical and/or  psychiatric conditions that are treated in part with regular participation in aerobic exercise and weight training .  I consider regular exercise  to be vital to the maintenance of his/her wellbeing and am recommending that he/she be allowed to return to regular workouts at your facility as long as the appropriate social distancing and other measures recommended by the CDC to minimize transmission or infection with the COVID 19 virus are followed.  Sincerely,    Crecencio Mc, MD

## 2019-08-24 NOTE — Telephone Encounter (Signed)
Letter placed in patient's mychart & I have called to notify patient she pay print.

## 2019-09-16 ENCOUNTER — Other Ambulatory Visit: Payer: Self-pay

## 2019-09-16 ENCOUNTER — Ambulatory Visit (INDEPENDENT_AMBULATORY_CARE_PROVIDER_SITE_OTHER): Payer: Medicare Other

## 2019-09-16 DIAGNOSIS — Z23 Encounter for immunization: Secondary | ICD-10-CM | POA: Diagnosis not present

## 2019-10-10 ENCOUNTER — Other Ambulatory Visit: Payer: Self-pay | Admitting: Internal Medicine

## 2019-11-07 ENCOUNTER — Other Ambulatory Visit: Payer: Self-pay | Admitting: Internal Medicine

## 2019-11-07 DIAGNOSIS — K219 Gastro-esophageal reflux disease without esophagitis: Secondary | ICD-10-CM

## 2019-11-07 DIAGNOSIS — K222 Esophageal obstruction: Secondary | ICD-10-CM

## 2019-11-08 ENCOUNTER — Other Ambulatory Visit: Payer: Self-pay | Admitting: Internal Medicine

## 2019-11-23 ENCOUNTER — Other Ambulatory Visit: Payer: Self-pay

## 2019-11-23 DIAGNOSIS — Z20822 Contact with and (suspected) exposure to covid-19: Secondary | ICD-10-CM

## 2019-11-25 LAB — NOVEL CORONAVIRUS, NAA: SARS-CoV-2, NAA: NOT DETECTED

## 2019-12-13 ENCOUNTER — Telehealth: Payer: Self-pay

## 2019-12-13 NOTE — Telephone Encounter (Signed)
Spoke with pt and informed her of Dr. Lupita Dawn message below about the medications. Pt said thank you but she should talk with the dentist first about the medications.

## 2019-12-13 NOTE — Telephone Encounter (Signed)
I agree that the z pack will not likely work. On an abscess.    I would have chosen Levaquin and metronidazole as a combination therapy  over azithromycin .   The cephalosporins are usually tolerated by most people with a penicillin allergy   If she wants me to call in the  levaquin and metronidazole now to take instead of azithromycin,  I will.   Remind her that metronidazole  CANNOT be combined with any alcohol while on it or you will have the worst hangover of your life.

## 2019-12-13 NOTE — Telephone Encounter (Signed)
Pt called and stated that she was at the dentist yesterday to have root canal and the doctor discovered that she has an abscess above it. She stated that she was prescribed a zpak to try to clear up the infection, however the dentist told her that it is not the drug they like to prescribe to clear up an in infection. They had to prescribe this because the pt is allergic to penicillin and clindamycin. The dentist had the pt reach out to you because if the zpak does not work he would like to put the pt on a medication in the cephalosporin family. Since it is related to the penicillin family he wanted to see what your thoughts was on prescribing that for the pt if needed. Pt has not taken penicillin in "60 years" because the one time she did take as a child she developed a rash.

## 2019-12-18 ENCOUNTER — Other Ambulatory Visit: Payer: Self-pay

## 2019-12-19 MED ORDER — ALPRAZOLAM 1 MG PO TABS: 1.0000 mg | ORAL_TABLET | Freq: Every evening | ORAL | 0 refills | Status: DC | PRN

## 2019-12-19 NOTE — Telephone Encounter (Signed)
Refilled: 06/09/2019 Last OV: 06/09/2019 Next OV: 06/13/2020

## 2020-01-02 ENCOUNTER — Other Ambulatory Visit: Payer: Self-pay | Admitting: Internal Medicine

## 2020-01-02 DIAGNOSIS — Z1231 Encounter for screening mammogram for malignant neoplasm of breast: Secondary | ICD-10-CM

## 2020-01-06 DIAGNOSIS — C44729 Squamous cell carcinoma of skin of left lower limb, including hip: Secondary | ICD-10-CM | POA: Diagnosis not present

## 2020-01-06 DIAGNOSIS — L84 Corns and callosities: Secondary | ICD-10-CM | POA: Diagnosis not present

## 2020-01-06 DIAGNOSIS — D1801 Hemangioma of skin and subcutaneous tissue: Secondary | ICD-10-CM | POA: Diagnosis not present

## 2020-01-06 DIAGNOSIS — Z85828 Personal history of other malignant neoplasm of skin: Secondary | ICD-10-CM | POA: Diagnosis not present

## 2020-01-06 DIAGNOSIS — D2272 Melanocytic nevi of left lower limb, including hip: Secondary | ICD-10-CM | POA: Diagnosis not present

## 2020-01-06 DIAGNOSIS — L814 Other melanin hyperpigmentation: Secondary | ICD-10-CM | POA: Diagnosis not present

## 2020-01-06 DIAGNOSIS — D2271 Melanocytic nevi of right lower limb, including hip: Secondary | ICD-10-CM | POA: Diagnosis not present

## 2020-01-06 DIAGNOSIS — L821 Other seborrheic keratosis: Secondary | ICD-10-CM | POA: Diagnosis not present

## 2020-01-13 ENCOUNTER — Ambulatory Visit: Payer: Medicare PPO | Attending: Internal Medicine

## 2020-01-13 DIAGNOSIS — Z23 Encounter for immunization: Secondary | ICD-10-CM | POA: Insufficient documentation

## 2020-01-13 NOTE — Progress Notes (Signed)
   Covid-19 Vaccination Clinic  Name:  Veronica Ferrell    MRN: [id number] DOB: 08-18-1951  01/13/2020  Ms. Sudar was observed post Covid-19 immunization for 15 minutes without incidence. She was provided with Vaccine Information Sheet and instruction to access the V-Safe system.   Ms. Shorr was instructed to call 911 with any severe reactions post vaccine: Marland Kitchen Difficulty breathing  . Swelling of your face and throat  . A fast heartbeat  . A bad rash all over your body  . Dizziness and weakness    Immunizations Administered    Name Date Dose VIS Date Route   Pfizer COVID-19 Vaccine 01/13/2020  9:30 AM 0.3 mL 12/02/2019 Intramuscular   Manufacturer: [place]   Lot: [id number]   [place]: [id number]

## 2020-01-19 DIAGNOSIS — C44729 Squamous cell carcinoma of skin of left lower limb, including hip: Secondary | ICD-10-CM | POA: Diagnosis not present

## 2020-02-03 ENCOUNTER — Ambulatory Visit: Payer: Medicare PPO | Attending: Internal Medicine

## 2020-02-03 DIAGNOSIS — Z23 Encounter for immunization: Secondary | ICD-10-CM | POA: Insufficient documentation

## 2020-02-03 NOTE — Progress Notes (Signed)
   Covid-19 Vaccination Clinic  Name:  Veronica Ferrell    MRN: [id number] DOB: [birth year]  02/03/2020  [name] was observed post Covid-19 immunization for 15 minutes without incidence. She was provided with Vaccine Information Sheet and instruction to access the V-Safe system.   [name] was instructed to call 911 with any severe reactions post vaccine: Marland Kitchen Difficulty breathing  . Swelling of your face and throat  . A fast heartbeat  . A bad rash all over your body  . Dizziness and weakness    Immunizations Administered    Name Date Dose VIS Date Route   Pfizer COVID-19 Vaccine 02/03/2020 10:27 AM 0.3 mL 12/02/2019 Intramuscular   Manufacturer: [place]   Lot: [id number]   [place]: [id number]

## 2020-02-16 ENCOUNTER — Ambulatory Visit
Admission: RE | Admit: 2020-02-16 | Discharge: 2020-02-16 | Disposition: A | Discharge status: Home / Self Care | Payer: Medicare PPO | Source: Ambulatory Visit | Attending: Internal Medicine | Admitting: Internal Medicine

## 2020-02-16 DIAGNOSIS — Z1231 Encounter for screening mammogram for malignant neoplasm of breast: Secondary | ICD-10-CM | POA: Insufficient documentation

## 2020-03-26 ENCOUNTER — Telehealth: Payer: Self-pay | Admitting: Internal Medicine

## 2020-03-26 DIAGNOSIS — Z0000 Encounter for general adult medical examination without abnormal findings: Secondary | ICD-10-CM

## 2020-03-26 DIAGNOSIS — Z79899 Other long term (current) drug therapy: Secondary | ICD-10-CM

## 2020-03-26 NOTE — Telephone Encounter (Signed)
Pt called she would like to have labs done before her CPE. No orders in the system

## 2020-03-27 NOTE — Addendum Note (Signed)
Addended by: Adair Laundry on: 03/27/2020 01:29 PM   Modules accepted: Orders

## 2020-03-27 NOTE — Telephone Encounter (Signed)
Pt would like to have labs done prior to her physical appt on 06/13/2020. I have ordered Lipid panel, TSH, CBC, a1c, CMP. Is there anything else that needs to be ordered?

## 2020-04-23 ENCOUNTER — Other Ambulatory Visit: Payer: Self-pay | Admitting: Internal Medicine

## 2020-05-09 ENCOUNTER — Encounter: Payer: Self-pay | Admitting: Internal Medicine

## 2020-05-09 ENCOUNTER — Telehealth (INDEPENDENT_AMBULATORY_CARE_PROVIDER_SITE_OTHER): Payer: Medicare PPO | Admitting: Internal Medicine

## 2020-05-09 VITALS — Ht 64.0 in | Wt 122.0 lb

## 2020-05-09 DIAGNOSIS — R7301 Impaired fasting glucose: Secondary | ICD-10-CM

## 2020-05-09 DIAGNOSIS — E271 Primary adrenocortical insufficiency: Secondary | ICD-10-CM

## 2020-05-09 DIAGNOSIS — F5104 Psychophysiologic insomnia: Secondary | ICD-10-CM | POA: Diagnosis not present

## 2020-05-09 DIAGNOSIS — K5909 Other constipation: Secondary | ICD-10-CM | POA: Diagnosis not present

## 2020-05-09 DIAGNOSIS — L659 Nonscarring hair loss, unspecified: Secondary | ICD-10-CM | POA: Diagnosis not present

## 2020-05-09 NOTE — Progress Notes (Signed)
Virtual Visit via caregility  This visit type was conducted due to national recommendations for restrictions regarding the COVID-19 pandemic (e.g. social distancing).  This format is felt to be most appropriate for this patient at this time.  All issues noted in this document were discussed and addressed.  No physical exam was performed (except for noted visual exam findings with Video Visits).   I connected with@ on 05/10/20 at  4:30 PM EDT by a video enabled telemedicine application or telephone and verified that I am speaking with the correct person using two identifiers. Location patient: home Location provider: work or home office Persons participating in the virtual visit: patient, provider  I discussed the limitations, risks, security and privacy concerns of performing an evaluation and management service by telephone and the availability of in person appointments. I also discussed with the patient that there may be a patient responsible charge related to this service. The patient expressed understanding and agreed to proceed.  Reason for visit: thinning hair ,  Becoming straight and dry .  Uses Well water,   HPI:  Veronica Ferrell is a 69 yr old female with Addison's Disease, managed with stable doses of florinef and hydrocortisone who presents with hair loss.  Starting noticing it about 5-6 months ago.  gradual .  Thinning,  Losing it at the crown .  Hair becoming straight.   Hairdresser also noticed it.  No change in diet .  No change in hair products. Does not perm or color hair.    Normal life stressors .No new meds except has reduced mirtazipine to 7.5 mg .  Appetite good  No weight loss , walking and exercising daily  .  Still tired a lot.  Manages constipation  With laxatives.  Drinks Furniture conservator/restorer.  One glass of wine.  Coffee in the am . No familial pattern of baldness.    Dose of fludracortisone has been stable ,, but she changed from taking 1  Mg every other day to 1/2 tablet daily  about 8 months  Ago, when her refilled medication contained tablets that were scored.    No changes in nails .  Has several grandchildren all in [place] from age 12 to 10.   No scalp itching.   ROS: See pertinent positives and negatives per HPI.  Past Medical History:  Diagnosis Date  . Addison's disease ([place])   . Arthritis    fingers  . Cancer ([place])    skin  . Chicken pox   . Colon polyps   . Diverticulitis   . GERD (gastroesophageal reflux disease)     Past Surgical History:  Procedure Laterality Date  . ABDOMINAL HYSTERECTOMY    . APPENDECTOMY    . BREAST BIOPSY Right 2010   neg  . BREAST SURGERY    . COLONOSCOPY WITH PROPOFOL N/A 07/14/2019   Procedure: COLONOSCOPY WITH PROPOFOL;  Surgeon: Lucilla Lame, MD;  Location: [facility] ENDOSCOPY;  Service: Endoscopy;  Laterality: N/A;  . ESOPHAGOGASTRODUODENOSCOPY (EGD) WITH PROPOFOL N/A 04/10/2017   Procedure: ESOPHAGOGASTRODUODENOSCOPY (EGD) WITH PROPOFOL;  Surgeon: Lucilla Lame, MD;  Location: [facility];  Service: Endoscopy;  Laterality: N/A;  requeests early as possible  . FOOT SURGERY Bilateral   . varicose veins      Family History  Problem Relation Age of Onset  . Cancer Mother 60       Breast Ca  colon Ca (45)  and brain Ca (41)  . Breast cancer Mother 33  .  Cancer Father   . Cancer Maternal Aunt        breast  . Breast cancer Maternal Aunt   . Cancer Maternal Grandmother        breast  . Breast cancer Maternal Grandmother   . Cancer Maternal Aunt        breast ca  . Breast cancer Maternal Aunt     SOCIAL HX:  reports that she has never smoked. She has never used smokeless tobacco. She reports current alcohol use of about 7.0 standard drinks of alcohol per week. She reports that she does not use drugs.   Current Outpatient Medications:  .  ALPRAZolam (XANAX) 1 MG tablet, Take 1 tablet (1 mg total) by mouth at bedtime as needed for anxiety., Disp: 30 tablet, Rfl: 0 .  aspirin 81 MG tablet, Take  81 mg by mouth once a week. , Disp: , Rfl:  .  calcium citrate (CALCITRATE - DOSED IN MG ELEMENTAL CALCIUM) 950 MG tablet, Take 1 tablet by mouth daily., Disp: , Rfl:  .  Cholecalciferol (VITAMIN D3) 1000 UNITS CAPS, Take 1 capsule by mouth daily., Disp: , Rfl:  .  diclofenac sodium (VOLTAREN) 1 % GEL, Apply 2 g topically 4 (four) times daily., Disp: 150 g, Rfl: 1 .  estradiol (ESTRACE) 1 MG tablet, Take 1 tablet (1 mg total) by mouth daily., Disp: 90 tablet, Rfl: 3 .  fludrocortisone (FLORINEF) 0.1 MG tablet, TAKE 1 TABLET BY MOUTH DAILY, Disp: 90 tablet, Rfl: 1 .  hydrocortisone (CORTEF) 10 MG tablet, TAKE TWO TABLETS EACH MORNING AND ONE TABLET EVERY NIGHT AS DIRECTED, Disp: 270 tablet, Rfl: 3 .  ipratropium (ATROVENT) 0.06 % nasal spray, USE TWO SPRAYS INTO EACH NOSTRIL FOUR TIMES A DAY, Disp: 15 mL, Rfl: 2 .  Lactulose 20 GM/30ML SOLN, 30 ml every 4 hours until constipation is relieved, Disp: 236 mL, Rfl: 3 .  mirtazapine (REMERON) 7.5 MG tablet, Take 3.75-7.5 mg by mouth at bedtime., Disp: , Rfl:  .  Multiple Vitamins-Minerals (MULTIVITAMIN WITH MINERALS) tablet, Take 1 tablet by mouth daily., Disp: , Rfl:  .  omeprazole (PRILOSEC) 40 MG capsule, TAKE 1 CAPSULE BY MOUTH EVERY DAY, Disp: 90 capsule, Rfl: 1 .  polyethylene glycol powder (GLYCOLAX/MIRALAX) powder, USE AS DIRECTED, Disp: 527 g, Rfl: 1 .  triamcinolone cream (KENALOG) 0.1 %, Apply 1 application topically 2 (two) times daily., Disp: 453.6 g, Rfl: 0 .  vitamin C (ASCORBIC ACID) 500 MG tablet, Take 500 mg by mouth daily., Disp: , Rfl:  .  promethazine (PHENERGAN) 12.5 MG tablet, Take 1 tablet (12.5 mg total) by mouth every 6 (six) hours as needed for nausea or vomiting. (Patient not taking: Reported on 07/07/2019), Disp: 30 tablet, Rfl: 0  EXAM:  VITALS per patient if applicable:  GENERAL: alert, oriented, appears well and in no acute distress  HEENT: atraumatic, conjunttiva clear, no obvious abnormalities on inspection of  external nose and ears  NECK: normal movements of the head and neck  LUNGS: on inspection no signs of respiratory distress, breathing rate appears normal, no obvious gross SOB, gasping or wheezing  CV: no obvious cyanosis  MS: moves all visible extremities without noticeable abnormality  PSYCH/NEURO: pleasant and cooperative, no obvious depression or anxiety, speech and thought processing grossly intact  ASSESSMENT AND PLAN:  Discussed the following assessment and plan:  Alopecia - Plan: IBC panel, Vitamin D 1,25 dihydroxy, Vitamin D 25 hydroxy, TSH, Zinc, DHEA-sulfate, Testos,Total,Free and SHBG (Female)  Addison's disease ([place]) -  Plan: Comprehensive metabolic panel  Impaired fasting glucose - Plan: Lipid panel, Hemoglobin A1c  Chronic insomnia  Chronic constipation  Alopecia New onset with no obvious triggers to suggest telogen effluvium.  Will begin workup for female pattern hair loss.  TSH. DHEA,  Testosterone levels pending   Chronic insomnia She has weaned herself down to 7.5 mg mirtazapine   Addison's disease ([place]) Managed with fludrocortisone and hydrocortisone.   Chronic constipation No recent obstipation issues using current bowel regimen and increased water intake     I discussed the assessment and treatment plan with the patient. The patient was provided an opportunity to ask questions and all were answered. The patient agreed with the plan and demonstrated an understanding of the instructions.   The patient was advised to call back or seek an in-person evaluation if the symptoms worsen or if the condition fails to improve as anticipated.  I provided  30 minutes of  face-to-face time during this encounter reviewing patient's current problems and past surgeries, labs and imaging studies, providing counseling on the above mentioned problems , and coordination  of care .  Crecencio Mc, MD

## 2020-05-10 DIAGNOSIS — L659 Nonscarring hair loss, unspecified: Secondary | ICD-10-CM | POA: Insufficient documentation

## 2020-05-10 NOTE — Assessment & Plan Note (Signed)
She has weaned herself down to 7.5 mg mirtazapine

## 2020-05-10 NOTE — Assessment & Plan Note (Signed)
New onset with no obvious triggers to suggest telogen effluvium.  Will begin workup for female pattern hair loss.  TSH. DHEA,  Testosterone levels pending

## 2020-05-10 NOTE — Assessment & Plan Note (Signed)
No recent obstipation issues using current bowel regimen and increased water intake  

## 2020-05-10 NOTE — Assessment & Plan Note (Signed)
Managed with fludrocortisone and hydrocortisone.

## 2020-05-16 ENCOUNTER — Other Ambulatory Visit: Payer: Self-pay

## 2020-05-16 ENCOUNTER — Encounter: Payer: Self-pay | Admitting: Nurse Practitioner

## 2020-05-16 ENCOUNTER — Other Ambulatory Visit (INDEPENDENT_AMBULATORY_CARE_PROVIDER_SITE_OTHER): Payer: Medicare PPO

## 2020-05-16 ENCOUNTER — Ambulatory Visit: Payer: Medicare PPO | Admitting: Nurse Practitioner

## 2020-05-16 VITALS — BP 104/70 | HR 101 | Temp 97.7°F | Ht 64.0 in | Wt 123.8 lb

## 2020-05-16 DIAGNOSIS — L659 Nonscarring hair loss, unspecified: Secondary | ICD-10-CM

## 2020-05-16 DIAGNOSIS — Z79899 Other long term (current) drug therapy: Secondary | ICD-10-CM

## 2020-05-16 DIAGNOSIS — E559 Vitamin D deficiency, unspecified: Secondary | ICD-10-CM | POA: Diagnosis not present

## 2020-05-16 DIAGNOSIS — Z0000 Encounter for general adult medical examination without abnormal findings: Secondary | ICD-10-CM

## 2020-05-16 DIAGNOSIS — T162XXA Foreign body in left ear, initial encounter: Secondary | ICD-10-CM | POA: Diagnosis not present

## 2020-05-16 DIAGNOSIS — R7301 Impaired fasting glucose: Secondary | ICD-10-CM

## 2020-05-16 DIAGNOSIS — H6010 Cellulitis of external ear, unspecified ear: Secondary | ICD-10-CM | POA: Diagnosis not present

## 2020-05-16 DIAGNOSIS — E271 Primary adrenocortical insufficiency: Secondary | ICD-10-CM

## 2020-05-16 DIAGNOSIS — S00462A Insect bite (nonvenomous) of left ear, initial encounter: Secondary | ICD-10-CM | POA: Diagnosis not present

## 2020-05-16 DIAGNOSIS — W57XXXA Bitten or stung by nonvenomous insect and other nonvenomous arthropods, initial encounter: Secondary | ICD-10-CM | POA: Diagnosis not present

## 2020-05-16 LAB — HEMOGLOBIN A1C: Hgb A1c MFr Bld: 6 % (ref 4.6–6.5)

## 2020-05-16 LAB — COMPREHENSIVE METABOLIC PANEL
ALT: 14 U/L (ref 0–35)
AST: 20 U/L (ref 0–37)
Albumin: 4.5 g/dL (ref 3.5–5.2)
Alkaline Phosphatase: 26 U/L — ABNORMAL LOW (ref 39–117)
BUN: 21 mg/dL (ref 6–23)
CO2: 25 mEq/L (ref 19–32)
Calcium: 10 mg/dL (ref 8.4–10.5)
Chloride: 96 mEq/L (ref 96–112)
Creatinine, Ser: 0.87 mg/dL (ref 0.40–1.20)
GFR: 64.17 mL/min (ref >60.00)
Glucose, Bld: 92 mg/dL (ref 70–99)
Potassium: 5.4 mEq/L — ABNORMAL HIGH (ref 3.5–5.1)
Sodium: 132 mEq/L — ABNORMAL LOW (ref 135–145)
Total Bilirubin: 0.5 mg/dL (ref 0.2–1.2)
Total Protein: 6.8 g/dL (ref 6.0–8.3)

## 2020-05-16 LAB — LIPID PANEL
Cholesterol: 235 mg/dL — ABNORMAL HIGH (ref 0–200)
HDL: 105.7 mg/dL (ref >39.00)
LDL Cholesterol: 110 mg/dL — ABNORMAL HIGH (ref 0–99)
NonHDL: 129.18
Total CHOL/HDL Ratio: 2
Triglycerides: 94 mg/dL (ref 0.0–149.0)
VLDL: 18.8 mg/dL (ref 0.0–40.0)

## 2020-05-16 LAB — CBC WITH DIFFERENTIAL/PLATELET
Basophils Absolute: 0 10*3/uL (ref 0.0–0.1)
Basophils Relative: 0.7 % (ref 0.0–3.0)
Eosinophils Absolute: 0.1 10*3/uL (ref 0.0–0.7)
Eosinophils Relative: 1.4 % (ref 0.0–5.0)
HCT: 42.2 % (ref 36.0–46.0)
Hemoglobin: 14 g/dL (ref 12.0–15.0)
Lymphocytes Relative: 44.2 % (ref 12.0–46.0)
Lymphs Abs: 3 10*3/uL (ref 0.7–4.0)
MCHC: 33.3 g/dL (ref 30.0–36.0)
MCV: 98.2 fl (ref 78.0–100.0)
Monocytes Absolute: 0.5 10*3/uL (ref 0.1–1.0)
Monocytes Relative: 7.8 % (ref 3.0–12.0)
Neutro Abs: 3.1 10*3/uL (ref 1.4–7.7)
Neutrophils Relative %: 45.9 % (ref 43.0–77.0)
Platelets: 350 10*3/uL (ref 150.0–400.0)
RBC: 4.3 Mil/uL (ref 3.87–5.11)
RDW: 13 % (ref 11.5–15.5)
WBC: 6.8 10*3/uL (ref 4.0–10.5)

## 2020-05-16 LAB — TSH: TSH: 2.91 u[IU]/mL (ref 0.35–4.50)

## 2020-05-16 LAB — IBC PANEL
Iron: 131 ug/dL (ref 42–145)
Saturation Ratios: 38.7 % (ref 20.0–50.0)
Transferrin: 242 mg/dL (ref 212.0–360.0)

## 2020-05-16 LAB — VITAMIN D 25 HYDROXY (VIT D DEFICIENCY, FRACTURES): VITD: 73.27 ng/mL (ref 30.00–100.00)

## 2020-05-16 MED ORDER — DOXYCYCLINE HYCLATE 100 MG PO TABS: 100.0000 mg | ORAL_TABLET | Freq: Two times a day (BID) | ORAL | 0 refills | Status: DC

## 2020-05-16 NOTE — Progress Notes (Addendum)
Established Patient Office Visit  Subjective:  Patient ID: Veronica Ferrell, female    DOB: [birth year]  Age: 69 y.o. MRN: [id number]  CC:  Chief Complaint  Patient presents with  . Acute Visit    Tick bite/head still attached    HPI [name] presents for tick found this am behind her left ear. She washed her hair and found a bump. She thinks she got it on Sat or Sun while doing yard work. She was not in tall grass, but did weeding, trimmed a  fig bush, among other yard chores. No pets. She has been outside to walk daily. No fever/chills/HA. No rash. She may get tick bites x 3 during the season. This is the first tick bite this year. Her husband tried to remove the tick this morning and got the body, but there is still part of the tick remaining. No hx of RMSF, Lyme,  or alpha-gal.    Past Medical History:  Diagnosis Date  . Addison's disease ([place])   . Arthritis    fingers  . Cancer ([place])    skin  . Chicken pox   . Colon polyps   . Diverticulitis   . GERD (gastroesophageal reflux disease)     Past Surgical History:  Procedure Laterality Date  . ABDOMINAL HYSTERECTOMY    . APPENDECTOMY    . BREAST BIOPSY Right 2010   neg  . BREAST SURGERY    . COLONOSCOPY WITH PROPOFOL N/A 07/14/2019   Procedure: COLONOSCOPY WITH PROPOFOL;  Surgeon: Lucilla Lame, MD;  Location: [facility] ENDOSCOPY;  Service: Endoscopy;  Laterality: N/A;  . ESOPHAGOGASTRODUODENOSCOPY (EGD) WITH PROPOFOL N/A 04/10/2017   Procedure: ESOPHAGOGASTRODUODENOSCOPY (EGD) WITH PROPOFOL;  Surgeon: Lucilla Lame, MD;  Location: [facility];  Service: Endoscopy;  Laterality: N/A;  requeests early as possible  . FOOT SURGERY Bilateral   . varicose veins      Family History  Problem Relation Age of Onset  . Cancer Mother 19       Breast Ca  colon Ca (17)  and brain Ca (7)  . Breast cancer Mother 67  . Cancer Father   . Cancer Maternal Aunt        breast  . Breast cancer Maternal Aunt   . Cancer Maternal  Grandmother        breast  . Breast cancer Maternal Grandmother   . Cancer Maternal Aunt        breast ca  . Breast cancer Maternal Aunt     Social History   Socioeconomic History  . Marital status: Married    Spouse name: Not on file  . Number of children: Not on file  . Years of education: Not on file  . Highest education level: Not on file  Occupational History  . Not on file  Tobacco Use  . Smoking status: Never Smoker  . Smokeless tobacco: Never Used  Substance and Sexual Activity  . Alcohol use: Yes    Alcohol/week: 7.0 standard drinks    Types: 7 Glasses of wine per week    Comment: WINE EACH WEEK  . Drug use: No  . Sexual activity: Yes  Other Topics Concern  . Not on file  Social History Narrative  . Not on file   Social Determinants of Health   Financial Resource Strain:   . Difficulty of Paying Living Expenses:   Food Insecurity:   . Worried About Charity fundraiser in the Last Year:   .  Ran Out of Food in the Last Year:   Transportation Needs:   . Film/video editor (Medical):   Marland Kitchen Lack of Transportation (Non-Medical):   Physical Activity:   . Days of Exercise per Week:   . Minutes of Exercise per Session:   Stress:   . Feeling of Stress :   Social Connections:   . Frequency of Communication with Friends and Family:   . Frequency of Social Gatherings with Friends and Family:   . Attends Religious Services:   . Active Member of Clubs or Organizations:   . Attends Archivist Meetings:   Marland Kitchen Marital Status:   Intimate Partner Violence:   . Fear of Current or Ex-Partner:   . Emotionally Abused:   Marland Kitchen Physically Abused:   . Sexually Abused:     Outpatient Medications Prior to Visit  Medication Sig Dispense Refill  . ALPRAZolam (XANAX) 1 MG tablet Take 1 tablet (1 mg total) by mouth at bedtime as needed for anxiety. 30 tablet 0  . aspirin 81 MG tablet Take 81 mg by mouth once a week.     . calcium citrate (CALCITRATE - DOSED IN MG  ELEMENTAL CALCIUM) 950 MG tablet Take 1 tablet by mouth daily.    . Cholecalciferol (VITAMIN D3) 1000 UNITS CAPS Take 1 capsule by mouth daily.    . diclofenac sodium (VOLTAREN) 1 % GEL Apply 2 g topically 4 (four) times daily. 150 g 1  . estradiol (ESTRACE) 1 MG tablet Take 1 tablet (1 mg total) by mouth daily. 90 tablet 3  . fludrocortisone (FLORINEF) 0.1 MG tablet TAKE 1 TABLET BY MOUTH DAILY 90 tablet 1  . hydrocortisone (CORTEF) 10 MG tablet TAKE TWO TABLETS EACH MORNING AND ONE TABLET EVERY NIGHT AS DIRECTED 270 tablet 3  . ipratropium (ATROVENT) 0.06 % nasal spray USE TWO SPRAYS INTO EACH NOSTRIL FOUR TIMES A DAY 15 mL 2  . Lactulose 20 GM/30ML SOLN 30 ml every 4 hours until constipation is relieved 236 mL 3  . mirtazapine (REMERON) 7.5 MG tablet Take 3.75-7.5 mg by mouth at bedtime.    . Multiple Vitamins-Minerals (MULTIVITAMIN WITH MINERALS) tablet Take 1 tablet by mouth daily.    Marland Kitchen omeprazole (PRILOSEC) 40 MG capsule TAKE 1 CAPSULE BY MOUTH EVERY DAY 90 capsule 1  . polyethylene glycol powder (GLYCOLAX/MIRALAX) powder USE AS DIRECTED 527 g 1  . promethazine (PHENERGAN) 12.5 MG tablet Take 1 tablet (12.5 mg total) by mouth every 6 (six) hours as needed for nausea or vomiting. 30 tablet 0  . triamcinolone cream (KENALOG) 0.1 % Apply 1 application topically 2 (two) times daily. 453.6 g 0  . vitamin C (ASCORBIC ACID) 500 MG tablet Take 500 mg by mouth daily.     No facility-administered medications prior to visit.    Allergies  Allergen Reactions  . Alendronate Sodium Nausea Only  . Boniva [Ibandronic Acid] Nausea And Vomiting  . Clindamycin/Lincomycin Dermatitis  . Tramadol     Red face  . Penicillins Rash     Review of Systems Pertinent negative noted in HPI.    Objective:    Physical Exam  Constitutional: She appears well-developed and well-nourished.  HENT:  Head: Normocephalic.  Tick bite site examined at external ear. Visible is part of the body and one leg seen  with ophthalmoscope light. The area was cleaned with betadine and alcohol and sterile tweezers used to pull out the remaining tick. The body and leg were removed, but the head is  deeply imbedded. No bleeding. No sign of cellulitis or rash.   Vitals reviewed.   BP 104/70 (BP Location: Left Arm, Patient Position: Sitting, Cuff Size: Small)   Pulse (!) 101   Temp 97.7 F (36.5 C) (Skin)   Ht 5\' 4"  (1.626 m)   Wt 123 lb 12.8 oz (56.2 kg)   SpO2 98%   BMI 21.25 kg/m  Wt Readings from Last 3 Encounters:  05/16/20 123 lb 12.8 oz (56.2 kg)  05/09/20 122 lb (55.3 kg)  07/14/19 118 lb 12.8 oz (53.9 kg)      This bottom photo is not true color. The top photo is a truer color not as red. Photos taken after the body and leg were removed. The head is still imbedded.     Assessment & Plan:   Problem List Items Addressed This Visit      Nervous and Auditory   Tick bite of ear, left, initial encounter - Primary   Relevant Orders   Ambulatory referral to ENT      Meds ordered this encounter  Medications  . doxycycline (VIBRA-TABS) 100 MG tablet    Sig: Take 1 tablet (100 mg total) by mouth 2 (two) times daily.    Dispense:  14 tablet    Refill:  0    Order Specific Question:   Supervising Provider    Answer:   Einar Pheasant [phone]   Attempt to remove the tick fully unsuccessful. The head is deeply imbedded. Dr. Derrel Nip in to examine and agrees that a scalpel will likely be needed. Dr. Tami Ribas will see her.   Please start on the doxycyline to prevent infection from this skin interruption.   Please See Dr. Tami Ribas in ENT to get the deeply imbedded tick head removed .   See information below about tick bites in general.  I provided  30 minutes of time during this encounter obtaining history and physical, attempting to remove tick x1, consulting with Dr. Derrel Nip and provided counseling on the doxycycline antibiotic and skin care, and coordination of care with getting her to see Dr.  Tami Ribas in ENT for urgent referral.     Follow-up: Return if symptoms worsen or fail to improve.   This visit occurred during the SARS-CoV-2 public health emergency.  Safety protocols were in place, including screening questions prior to the visit, additional usage of staff PPE, and extensive cleaning of exam room while observing appropriate contact time as indicated for disinfecting solutions.   Denice Paradise, NP

## 2020-05-16 NOTE — Patient Instructions (Addendum)
Please start on the doxycyline to prevent infection form this skin interruption.   Please See Dr. Tami Ribas for ENT to get the deeply imbedded tick head removed .   See information below about tick bites in general.     Let's follow these tick bites with close observation. Information below on Lyme disease and Tick bites in general.   If there is no improvement in your symptoms, or if there is any worsening of symptoms, or if you have any additional concerns, please return for re-evaluation; or, if we are closed, consider going to the Emergency Room for evaluation if symptoms urgent.  Patient education: Lyme disease (The Basics)[place] in [place]  Written by the doctors and editors at [employer]  What is Lyme disease? -- Lyme disease is an illness that can make you feel like you have the flu. It can also cause a rash, fever, or nerve, joint, or heart problems. People can get Lyme disease after being bitten by a tiny insect called a tick. When a certain type of tick bites you, it can transmit the germ that causes Lyme disease from its body to yours. But a tick can infect you only if it stays attached for at least a day. The ticks that carry Lyme disease feed on deer and mice. Ticks are found in tall grass and on shrubs, and can attach to animals and people walking by. Ticks cannot fly or jump. What are the symptoms of Lyme disease? -- Symptoms can start days or weeks after a tick bite. They include: ?A rash where you were bitten - The rash often appears within a month of getting bitten. It is red, but its center can be the color of your skin. It might get bigger over a few days. To some, it looks like a "bull's eye" (picture 1). ?Fever ?Feeling tired ?Body aches and pains ?Heart problems such as a slowed heart rate ?Headache and stiff neck ?Feelings of pain, weakness, or numbness If a person is not treated, further symptoms can occur months to years after a tick bite. These include: ?Pain and  swelling of joints, such as your knees ?Trouble with your memory and thinking ?Skin problems, such as skin swelling or thinning (this occurs mostly in [place]) Is there a test for Lyme disease? -- Yes. Blood tests can show if you are infected with the germ that causes Lyme disease. But, it takes time for the blood tests to turn positive. This means the tests won't work if you get them right after being bitten. Also, sometimes the blood tests come back negative even when you have the rash that goes with Lyme disease. Because of this, if you have the rash, the blood test is not needed to confirm that you have Lyme disease. If your doctor or nurse suspects you have Lyme disease, he or she will do an exam and ask you questions. The doctor or nurse will use this information (and your blood test result, if needed) to decide about treatment. What should I do if I get bitten by a tick or if my child gets bitten? -- If you find a tick on your body or on your child, use tweezers to grab it. Then pull it out slowly and gently. After that, wash the area with soap and water. You do not need to keep the tick. But knowing what it looked like can help your doctor decide about your treatment. See if you can tell: ?Its color and size ?If it was  attached to your skin or just resting on your skin ?If it was big, round, and full of blood (picture 2) You should watch the area around the bite for a month to see if a rash occurs. Should I see a doctor or nurse? -- See your doctor or nurse if you have a tick and you cannot get it off or if you think you have had a tick attached for at least 36 hours (a day and a half). You should also see a doctor or nurse if you develop symptoms of Lyme disease. Some people don't know that they were bitten by a tick. Or they might not remember having a rash or early symptoms of Lyme disease. How is Lyme disease treated? -- Lyme disease is usually treated with antibiotics. Treatment with  antibiotics should help your symptoms go away. Sometimes, symptoms improve quickly. Other times, it can take weeks or months for symptoms to go away. Your doctor might prescribe medicine for you to take right after a tick bite. Or your doctor might wait to see if you first develop symptoms. Either way, the medicine will treat your Lyme disease. What can I do to try to avoid getting bitten by a tick? -- You can: ?Wear shoes, long-sleeved shirts, and long pants when you go outside. Keep ticks away from your skin by tucking your pants into your socks. ?Wear light colors so you can spot any ticks that get on your clothes ?Use bug sprays to keep ticks off your skin or clothes ?Shower within 2 hours of being outdoors if you think you have been in an area where there are ticks ?Check your clothes and body for ticks after being outdoors. Be sure to check your scalp, waist, armpits, groin, and backs of your knees. Check your children, too. ?If you live in a place that has deer or mice nearby, take steps to keep those animals away. Deer and mice carry ticks.   Tick Bite Information Ticks are insects that attach themselves to the skin and draw blood for food. There are various types of ticks. Common types include wood ticks and deer ticks. Most ticks live in shrubs and grassy areas. Ticks can climb onto your body when you make contact with leaves or grass where the tick is waiting. The most common places on the body for ticks to attach themselves are the scalp, neck, armpits, waist, and groin. Most tick bites are harmless, but sometimes ticks carry germs that cause diseases. These germs can be spread to a person during the tick's feeding process. The chance of a disease spreading through a tick bite depends on:   The type of tick.  Time of year.    How long the tick is attached.    Geographic location.   HOW CAN YOU PREVENT TICK BITES? Take these steps to help prevent tick bites when you are  outdoors:  Wear protective clothing. Long sleeves and long pants are best.    Wear white clothes so you can see ticks more easily.  Tuck your pant legs into your socks.    If walking on a trail, stay in the middle of the trail to avoid brushing against bushes.  Avoid walking through areas with long grass.   Put insect repellent on all exposed skin and along boot tops, pant legs, and sleeve cuffs.    Check clothing, hair, and skin repeatedly and before going inside.    Brush off any ticks that are not attached.  Take a shower or bath as soon as possible after being outdoors.    WHAT IS THE PROPER WAY TO REMOVE A TICK? Ticks should be removed as soon as possible to help prevent diseases caused by tick bites. 1. If latex gloves are available, put them on before trying to remove a tick.   2. Using fine-point tweezers, grasp the tick as close to the skin as possible. You may also use curved forceps or a tick removal tool. Grasp the tick as close to its head as possible. Avoid grasping the tick on its body. 3. Pull gently with steady upward pressure until the tick lets go. Do not twist the tick or jerk it suddenly. This may break off the tick's head or mouth parts. 4. Do not squeeze or crush the tick's body. This could force disease-carrying fluids from the tick into your body.   5. After the tick is removed, wash the bite area and your hands with soap and water or other disinfectant such as alcohol. 6. Apply a small amount of antiseptic cream or ointment to the bite site.   7. Wash and disinfect any instruments that were used.   Do not try to remove a tick by applying a hot match, petroleum jelly, or fingernail polish to the tick. These methods do not work and may increase the chances of disease being spread from the tick bite.  WHEN SHOULD YOU SEEK MEDICAL CARE? Contact your health care provider if you are unable to remove a tick from your skin or if a part of the tick breaks off and is  stuck in the skin.  After a tick bite, you need to be aware of signs and symptoms that could be related to diseases spread by ticks. Contact your health care provider if you develop any of the following in the days or weeks after the tick bite:  Unexplained fever.  Rash. A circular rash that appears days or weeks after the tick bite may indicate the possibility of Lyme disease. The rash may resemble a target with a bull's-eye and may occur at a different part of your body than the tick bite.  Redness and swelling in the area of the tick bite.    Tender, swollen lymph glands.    Diarrhea.    Weight loss.    Cough.    Fatigue.    Muscle, joint, or bone pain.    Abdominal pain.    Headache.    Lethargy or a change in your level of consciousness.  Difficulty walking or moving your legs.    Numbness in the legs.    Paralysis.  Shortness of breath.    Confusion.    Repeated vomiting.     This information is not intended to replace advice given to you by your health care provider. Make sure you discuss any questions you have with your health care provider.   Document Released: 12/05/2000 Document Revised: 12/29/2014 Document Reviewed: 05/18/2013 Elsevier Interactive Patient Education [employer].

## 2020-05-19 LAB — ZINC: Zinc: 103 ug/dL (ref 60–130)

## 2020-05-19 LAB — VITAMIN D 1,25 DIHYDROXY
Vitamin D 1, 25 (OH)2 Total: 32 pg/mL (ref 18–72)
Vitamin D2 1, 25 (OH)2: <8 pg/mL
Vitamin D3 1, 25 (OH)2: 32 pg/mL

## 2020-05-19 LAB — TESTOS,TOTAL,FREE AND SHBG (FEMALE)
Free Testosterone: 0.3 pg/mL (ref 0.2–3.7)
Sex Hormone Binding: 181 nmol/L — ABNORMAL HIGH (ref 14–73)
Testosterone, Total, LC-MS-MS: 7 ng/dL (ref 2–45)

## 2020-05-19 LAB — DHEA-SULFATE: DHEA-SO4: <2 ug/dL — ABNORMAL LOW (ref 7–177)

## 2020-05-28 ENCOUNTER — Other Ambulatory Visit: Payer: Self-pay | Admitting: Internal Medicine

## 2020-05-28 DIAGNOSIS — K219 Gastro-esophageal reflux disease without esophagitis: Secondary | ICD-10-CM

## 2020-05-29 ENCOUNTER — Other Ambulatory Visit: Payer: Self-pay | Admitting: Internal Medicine

## 2020-06-13 ENCOUNTER — Other Ambulatory Visit: Payer: Self-pay

## 2020-06-13 ENCOUNTER — Encounter: Payer: Self-pay | Admitting: Internal Medicine

## 2020-06-13 ENCOUNTER — Ambulatory Visit (INDEPENDENT_AMBULATORY_CARE_PROVIDER_SITE_OTHER): Payer: Medicare PPO | Admitting: Internal Medicine

## 2020-06-13 VITALS — BP 88/56 | HR 82 | Temp 97.6°F | Resp 14 | Ht 64.0 in | Wt 123.4 lb

## 2020-06-13 DIAGNOSIS — E271 Primary adrenocortical insufficiency: Secondary | ICD-10-CM

## 2020-06-13 DIAGNOSIS — Z0000 Encounter for general adult medical examination without abnormal findings: Secondary | ICD-10-CM

## 2020-06-13 DIAGNOSIS — R5383 Other fatigue: Secondary | ICD-10-CM

## 2020-06-13 LAB — CBC WITH DIFFERENTIAL/PLATELET
Basophils Absolute: 0 10*3/uL (ref 0.0–0.1)
Basophils Relative: 0.5 % (ref 0.0–3.0)
Eosinophils Absolute: 0 10*3/uL (ref 0.0–0.7)
Eosinophils Relative: 0.4 % (ref 0.0–5.0)
HCT: 39.7 % (ref 36.0–46.0)
Hemoglobin: 13.6 g/dL (ref 12.0–15.0)
Lymphocytes Relative: 32.4 % (ref 12.0–46.0)
Lymphs Abs: 2.4 10*3/uL (ref 0.7–4.0)
MCHC: 34.2 g/dL (ref 30.0–36.0)
MCV: 96.9 fl (ref 78.0–100.0)
Monocytes Absolute: 0.5 10*3/uL (ref 0.1–1.0)
Monocytes Relative: 6.6 % (ref 3.0–12.0)
Neutro Abs: 4.5 10*3/uL (ref 1.4–7.7)
Neutrophils Relative %: 60.1 % (ref 43.0–77.0)
Platelets: 340 10*3/uL (ref 150.0–400.0)
RBC: 4.1 Mil/uL (ref 3.87–5.11)
RDW: 12.8 % (ref 11.5–15.5)
WBC: 7.5 10*3/uL (ref 4.0–10.5)

## 2020-06-13 LAB — COMPREHENSIVE METABOLIC PANEL
ALT: 13 U/L (ref 0–35)
AST: 16 U/L (ref 0–37)
Albumin: 4.4 g/dL (ref 3.5–5.2)
Alkaline Phosphatase: 28 U/L — ABNORMAL LOW (ref 39–117)
BUN: 21 mg/dL (ref 6–23)
CO2: 28 mEq/L (ref 19–32)
Calcium: 9.5 mg/dL (ref 8.4–10.5)
Chloride: 96 mEq/L (ref 96–112)
Creatinine, Ser: 0.83 mg/dL (ref 0.40–1.20)
GFR: 67.74 mL/min (ref >60.00)
Glucose, Bld: 94 mg/dL (ref 70–99)
Potassium: 4.9 mEq/L (ref 3.5–5.1)
Sodium: 132 mEq/L — ABNORMAL LOW (ref 135–145)
Total Bilirubin: 0.3 mg/dL (ref 0.2–1.2)
Total Protein: 7 g/dL (ref 6.0–8.3)

## 2020-06-13 LAB — SEDIMENTATION RATE: Sed Rate: 12 mm/hr (ref 0–30)

## 2020-06-13 LAB — TSH: TSH: 1.5 u[IU]/mL (ref 0.35–4.50)

## 2020-06-13 MED ORDER — FLUDROCORTISONE ACETATE 0.1 MG PO TABS: 100.0000 ug | ORAL_TABLET | Freq: Every day | ORAL | 1 refills | Status: DC

## 2020-06-13 NOTE — Patient Instructions (Signed)
Increase the florinef to a full tablet daily  Follow BP and weight and update me in a week    Health Maintenance After Age 71 After age 66, you are at a higher risk for certain long-term diseases and infections as well as injuries from falls. Falls are a major cause of broken bones and head injuries in people who are older than age 16. Getting regular preventive care can help to keep you healthy and well. Preventive care includes getting regular testing and making lifestyle changes as recommended by your health care provider. Talk with your health care provider about:  Which screenings and tests you should have. A screening is a test that checks for a disease when you have no symptoms.  A diet and exercise plan that is right for you. What should I know about screenings and tests to prevent falls? Screening and testing are the best ways to find a health problem early. Early diagnosis and treatment give you the best chance of managing medical conditions that are common after age 73. Certain conditions and lifestyle choices may make you more likely to have a fall. Your health care provider may recommend:  Regular vision checks. Poor vision and conditions such as cataracts can make you more likely to have a fall. If you wear glasses, make sure to get your prescription updated if your vision changes.  Medicine review. Work with your health care provider to regularly review all of the medicines you are taking, including over-the-counter medicines. Ask your health care provider about any side effects that may make you more likely to have a fall. Tell your health care provider if any medicines that you take make you feel dizzy or sleepy.  Osteoporosis screening. Osteoporosis is a condition that causes the bones to get weaker. This can make the bones weak and cause them to break more easily.  Blood pressure screening. Blood pressure changes and medicines to control blood pressure can make you feel  dizzy.  Strength and balance checks. Your health care provider may recommend certain tests to check your strength and balance while standing, walking, or changing positions.  Foot health exam. Foot pain and numbness, as well as not wearing proper footwear, can make you more likely to have a fall.  Depression screening. You may be more likely to have a fall if you have a fear of falling, feel emotionally low, or feel unable to do activities that you used to do.  Alcohol use screening. Using too much alcohol can affect your balance and may make you more likely to have a fall. What actions can I take to lower my risk of falls? General instructions  Talk with your health care provider about your risks for falling. Tell your health care provider if: ? You fall. Be sure to tell your health care provider about all falls, even ones that seem minor. ? You feel dizzy, sleepy, or off-balance.  Take over-the-counter and prescription medicines only as told by your health care provider. These include any supplements.  Eat a healthy diet and maintain a healthy weight. A healthy diet includes low-fat dairy products, low-fat (lean) meats, and fiber from whole grains, beans, and lots of fruits and vegetables. Home safety  Remove any tripping hazards, such as rugs, cords, and clutter.  Install safety equipment such as grab bars in bathrooms and safety rails on stairs.  Keep rooms and walkways well-lit. Activity   Follow a regular exercise program to stay fit. This will help you maintain  your balance. Ask your health care provider what types of exercise are appropriate for you.  If you need a cane or walker, use it as recommended by your health care provider.  Wear supportive shoes that have nonskid soles. Lifestyle  Do not drink alcohol if your health care provider tells you not to drink.  If you drink alcohol, limit how much you have: ? 0-1 drink a day for women. ? 0-2 drinks a day for  men.  Be aware of how much alcohol is in your drink. In the [place] one drink equals one typical bottle of beer (12 oz), one-half glass of wine (5 oz), or one shot of hard liquor (1 oz).  Do not use any products that contain nicotine or tobacco, such as cigarettes and e-cigarettes. If you need help quitting, ask your health care provider. Summary  Having a healthy lifestyle and getting preventive care can help to protect your health and wellness after age 25.  Screening and testing are the best way to find a health problem early and help you avoid having a fall. Early diagnosis and treatment give you the best chance for managing medical conditions that are more common for people who are older than age 57.  Falls are a major cause of broken bones and head injuries in people who are older than age 73. Take precautions to prevent a fall at home.  Work with your health care provider to learn what changes you can make to improve your health and wellness and to prevent falls. This information is not intended to replace advice given to you by your health care provider. Make sure you discuss any questions you have with your health care provider. Document Revised: 03/31/2019 Document Reviewed: 10/21/2017 Elsevier Patient Education  2020 [employer].

## 2020-06-13 NOTE — Progress Notes (Signed)
Patient ID: Veronica Ferrell, female    DOB: May 24, 1951  Age: 69 y.o. MRN: [id number]  The patient is here for annual preventive examination and management of other chronic and acute problems.  This visit occurred during the SARS-CoV-2 public health emergency.  Safety protocols were in place, including screening questions prior to the visit, additional usage of staff PPE, and extensive cleaning of exam room while observing appropriate contact time as indicated for disinfecting solutions.      The risk factors are reflected in the social history.  The roster of all physicians providing medical care to patient - is listed in the Snapshot section of the chart.  Activities of daily living:  The patient is 100% independent in all ADLs: dressing, toileting, feeding as well as independent mobility  Home safety : The patient has smoke detectors in the home. They wear seatbelts.  There are no firearms at home. There is no violence in the home.   There is no risks for hepatitis, STDs or HIV. There is no   history of blood transfusion. They have no travel history to infectious disease endemic areas of the world.  The patient has seen their dentist in the last six month. They have seen their eye doctor in the last year. They admit to slight hearing difficulty with regard to whispered voices and some television programs.  They have deferred audiologic testing in the last year.  They do not  have excessive sun exposure. Discussed the need for sun protection: hats, long sleeves and use of sunscreen if there is significant sun exposure.   Diet: the importance of a healthy diet is discussed. They do have a healthy diet.  The benefits of regular aerobic exercise were discussed. She walks 4 times per week ,  20 minutes.   Depression screen: there are no signs or vegative symptoms of depression- irritability, change in appetite, anhedonia, sadness/tearfullness.  Cognitive assessment: the patient manages all their  financial and personal affairs and is actively engaged. They could relate day,date,year and events; recalled 2/3 objects at 3 minutes; performed clock-face test normally.  The following portions of the patient's history were reviewed and updated as appropriate: allergies, current medications, past family history, past medical history,  past surgical history, past social history  and problem list.  Visual acuity was not assessed per patient preference since she has regular follow up with her ophthalmologist. Hearing and body mass index were assessed and reviewed.   During the course of the visit the patient was educated and counseled about appropriate screening and preventive services including : fall prevention , diabetes screening, nutrition counseling, colorectal cancer screening, and recommended immunizations.    CC: The primary encounter diagnosis was Fatigue, unspecified type. Diagnoses of Encounter for preventive health examination and Addison's disease [facility]) were also pertinent to this visit.  Fatigue, nausea, mild headache for the past 2 weeks.  Gets Exhausted just taking a shower.  Has to stop going up the stairs. BP lower than usual.  Does not  check it at home. Takes florinef  1/2 tablet daily .  History Hazely has a past medical history of Addison's disease ([place]), Arthritis, Cancer ([place]), Chicken pox, Colon polyps, Diverticulitis, and GERD (gastroesophageal reflux disease).   She has a past surgical history that includes varicose veins; Breast surgery; Appendectomy; Abdominal hysterectomy; Foot surgery (Bilateral); Esophagogastroduodenoscopy (egd) with propofol (N/A, 04/10/2017); Colonoscopy with propofol (N/A, 07/14/2019); and Breast biopsy (Right, 2010).   Her family history includes Breast cancer in her  maternal aunt, maternal aunt, and maternal grandmother; Breast cancer (age of onset: 39) in her mother; Cancer in her father, maternal aunt, maternal aunt, and maternal grandmother;  Cancer (age of onset: 15) in her mother.She reports that she has never smoked. She has never used smokeless tobacco. She reports current alcohol use of about 7.0 standard drinks of alcohol per week. She reports that she does not use drugs.  Outpatient Medications Prior to Visit  Medication Sig Dispense Refill  . ALPRAZolam (XANAX) 1 MG tablet Take 1 tablet (1 mg total) by mouth at bedtime as needed for anxiety. 30 tablet 0  . aspirin 81 MG tablet Take 81 mg by mouth once a week.     . calcium citrate (CALCITRATE - DOSED IN MG ELEMENTAL CALCIUM) 950 MG tablet Take 1 tablet by mouth daily.    . Cholecalciferol (VITAMIN D3) 1000 UNITS CAPS Take 1 capsule by mouth daily.    . diclofenac sodium (VOLTAREN) 1 % GEL Apply 2 g topically 4 (four) times daily. 150 g 1  . estradiol (ESTRACE) 1 MG tablet TAKE ONE TABLET BY MOUTH EVERY DAY 90 tablet 3  . hydrocortisone (CORTEF) 20 MG tablet TAKE ONE TABLET EACH MORNING AND ONE-HALF TABLET EVERY NIGHT AS DIRECTED 135 tablet 0  . ipratropium (ATROVENT) 0.06 % nasal spray USE TWO SPRAYS INTO EACH NOSTRIL FOUR TIMES A DAY 15 mL 2  . Lactulose 20 GM/30ML SOLN 30 ml every 4 hours until constipation is relieved 236 mL 3  . mirtazapine (REMERON) 7.5 MG tablet Take 3.75-7.5 mg by mouth at bedtime.    . Multiple Vitamins-Minerals (MULTIVITAMIN WITH MINERALS) tablet Take 1 tablet by mouth daily.    Marland Kitchen omeprazole (PRILOSEC) 40 MG capsule TAKE 1 CAPSULE BY MOUTH EVERY DAY 90 capsule 1  . polyethylene glycol powder (GLYCOLAX/MIRALAX) powder USE AS DIRECTED 527 g 1  . promethazine (PHENERGAN) 12.5 MG tablet Take 1 tablet (12.5 mg total) by mouth every 6 (six) hours as needed for nausea or vomiting. 30 tablet 0  . triamcinolone cream (KENALOG) 0.1 % Apply 1 application topically 2 (two) times daily. 453.6 g 0  . vitamin C (ASCORBIC ACID) 500 MG tablet Take 500 mg by mouth daily.    . fludrocortisone (FLORINEF) 0.1 MG tablet TAKE 1 TABLET BY MOUTH DAILY 90 tablet 1  .  doxycycline (VIBRA-TABS) 100 MG tablet Take 1 tablet (100 mg total) by mouth 2 (two) times daily. 14 tablet 0   No facility-administered medications prior to visit.    Review of Systems   Patient denies headache, fevers, malaise, unintentional weight loss, skin rash, eye pain, sinus congestion and sinus pain, sore throat, dysphagia,  hemoptysis , cough, dyspnea, wheezing, chest pain, palpitations, orthopnea, edema, abdominal pain, nausea, melena, diarrhea, constipation, flank pain, dysuria, hematuria, urinary  Frequency, nocturia, numbness, tingling, seizures,  Focal weakness, Loss of consciousness,  Tremor, insomnia, depression, anxiety, and suicidal ideation.      Objective:  BP (!) 88/56 (BP Location: Left Arm, Patient Position: Sitting, Cuff Size: Normal)   Pulse 82   Temp 97.6 F (36.4 C) (Temporal)   Resp 14   Ht 5\' 4"  (1.626 m)   Wt 123 lb 6.4 oz (56 kg)   SpO2 97%   BMI 21.18 kg/m   Physical Exam  General appearance: alert, cooperative and appears stated age Ears: normal TM's and external ear canals both ears Throat: lips, mucosa, and tongue normal; teeth and gums normal Neck: no adenopathy, no carotid bruit, supple, symmetrical,  trachea midline and thyroid not enlarged, symmetric, no tenderness/mass/nodules Back: symmetric, no curvature. ROM normal. No CVA tenderness. Lungs: clear to auscultation bilaterally Heart: regular rate and rhythm, S1, S2 normal, no murmur, click, rub or gallop Abdomen: soft, non-tender; bowel sounds normal; no masses,  no organomegaly Pulses: 2+ and symmetric Skin: Skin color, texture, turgor normal. No rashes or lesions Lymph nodes: Cervical, supraclavicular, and axillary nodes normal.  Assessment & Plan:   Problem List Items Addressed This Visit      Unprioritized   Encounter for preventive health examination    age appropriate education and counseling updated, referrals for preventative services and immunizations addressed, dietary and  smoking counseling addressed, most recent labs reviewed.  I have personally reviewed and have noted:  1) the patient's medical and social history 2) The pt's use of alcohol, tobacco, and illicit drugs 3) The patient's current medications and supplements 4) Functional ability including ADL's, fall risk, home safety risk, hearing and visual impairment 5) Diet and physical activities 6) Evidence for depression or mood disorder 7) The patient's height, weight, and BMI have been recorded in the chart  I have made referrals, and provided counseling and education based on review of the above      Addison's disease ([place])    She has been fatigued and hypotensive for the last several weeks.  Will increase florinef dose .  Lab Results  Component Value Date   NA 132 (L) 06/13/2020   K 4.9 06/13/2020   CL 96 06/13/2020   CO2 28 06/13/2020   Lab Results  Component Value Date   CREATININE 0.83 06/13/2020          Other Visit Diagnoses    Fatigue, unspecified type    -  Primary   Relevant Orders   Comprehensive metabolic panel (Completed)   Lyme Ab (IgG/M) + RMSF( IgG/M) (Completed)   CBC with Differential/Platelet (Completed)   Sedimentation rate (Completed)   TSH (Completed)      I have discontinued Doreene Nest "[place]"'s doxycycline. I have also changed her fludrocortisone. Additionally, I am having her maintain her aspirin, Vitamin D3, calcium citrate, multivitamin with minerals, vitamin C, polyethylene glycol powder, promethazine, Lactulose, triamcinolone cream, diclofenac sodium, ALPRAZolam, ipratropium, mirtazapine, estradiol, omeprazole, and hydrocortisone.  Meds ordered this encounter  Medications  . fludrocortisone (FLORINEF) 0.1 MG tablet    Sig: Take 1 tablet (100 mcg total) by mouth daily.    Dispense:  90 tablet    Refill:  1    PT REQ REFILL PLEASE SEND NEW RX THANKS    Medications Discontinued During This Encounter  Medication Reason  . doxycycline  (VIBRA-TABS) 100 MG tablet Completed Course  . fludrocortisone (FLORINEF) 0.1 MG tablet Reorder    Follow-up: No follow-ups on file.   Crecencio Mc, MD

## 2020-06-15 DIAGNOSIS — R5383 Other fatigue: Secondary | ICD-10-CM | POA: Insufficient documentation

## 2020-06-15 LAB — LYMEAB(IGG/M)+RMSF(IGG/M)
LYME DISEASE AB, QUANT, IGM: <0.8 index (ref 0.00–0.79)
Lyme IgG/IgM Ab: <0.91 {ISR} (ref 0.00–0.90)
RMSF IgG: NEGATIVE
RMSF IgM: 0.3 index (ref 0.00–0.89)

## 2020-06-15 NOTE — Assessment & Plan Note (Signed)
She has been fatigued and hypotensive for the last several weeks.  Will increase florinef dose .  Lab Results  Component Value Date   NA 132 (L) 06/13/2020   K 4.9 06/13/2020   CL 96 06/13/2020   CO2 28 06/13/2020   Lab Results  Component Value Date   CREATININE 0.83 06/13/2020

## 2020-06-15 NOTE — Assessment & Plan Note (Signed)

## 2020-06-15 NOTE — Assessment & Plan Note (Signed)
Checking for thyroid insufficiency, anemia, and  tick borne illnesses given recent tick bite and new onset profound fatigue  Lab Results  Component Value Date   TSH 1.50 06/13/2020   Lab Results  Component Value Date   WBC 7.5 06/13/2020   HGB 13.6 06/13/2020   HCT 39.7 06/13/2020   MCV 96.9 06/13/2020   PLT 340.0 06/13/2020

## 2020-06-17 ENCOUNTER — Other Ambulatory Visit: Payer: Self-pay | Admitting: Internal Medicine

## 2020-06-17 MED ORDER — ALPRAZOLAM 1 MG PO TABS: 1.0000 mg | ORAL_TABLET | Freq: Every evening | ORAL | 5 refills | Status: DC | PRN

## 2020-06-20 ENCOUNTER — Ambulatory Visit: Payer: Medicare Other

## 2020-07-02 ENCOUNTER — Other Ambulatory Visit: Payer: Self-pay | Admitting: Internal Medicine

## 2020-07-10 ENCOUNTER — Ambulatory Visit (INDEPENDENT_AMBULATORY_CARE_PROVIDER_SITE_OTHER): Payer: Medicare PPO

## 2020-07-10 VITALS — BP 97/66 | HR 86 | Ht 64.0 in | Wt 123.0 lb

## 2020-07-10 DIAGNOSIS — Z0000 Encounter for general adult medical examination without abnormal findings: Secondary | ICD-10-CM | POA: Diagnosis not present

## 2020-07-10 NOTE — Patient Instructions (Addendum)
Veronica Ferrell , Thank you for taking time to come for your Medicare Wellness Visit. I appreciate your ongoing commitment to your health goals. Please review the following plan we discussed and let me know if I can assist you in the future.   These are the goals we discussed: Goals      Patient Stated   .  Maintain Healthy Lifestyle (pt-stated)      Healthy diet Stay active Stay hydrated Follow up with pcp as needed       This is a list of the screening recommended for you and due dates:  Health Maintenance  Topic Date Due  . Flu Shot  07/22/2020  . Mammogram  02/15/2021  . Tetanus Vaccine  03/08/2021  . Colon Cancer Screening  07/13/2024  . DEXA scan (bone density measurement)  Completed  . COVID-19 Vaccine  Completed  .  Hepatitis C: One time screening is recommended by Center for Disease Control  (CDC) for  adults born from 1 through 1967.   Completed  . Pneumonia vaccines  Completed   Immunizations Immunization History  Administered Date(s) Administered  . Fluad Quad(high Dose 65+) 09/16/2019  . Influenza Split 09/21/2013, 09/19/2015, 08/30/2017  . Influenza,inj,Quad PF,6+ Mos 09/11/2014  . Influenza-Unspecified 08/22/2018  . PFIZER SARS-COV-2 Vaccination 01/13/2020, 02/03/2020  . Pneumococcal Conjugate-13 09/11/2014  . Pneumococcal Polysaccharide-23 03/08/2012, 03/30/2018  . Tdap 03/09/2011  . Zoster 03/08/2012  . Zoster Recombinat (Shingrix) 08/24/2018, 11/19/2018   Keep all routine maintenance appointments.   Cpe 06/18/21  Advanced directives: End of life planning; Advance aging; Advanced directives discussed.  Copy of current HCPOA/Living Will requested.    Conditions/risks identified: none new  Follow up in one year for your annual wellness visit    Preventive Care 65 Years and Older, Female Preventive care refers to lifestyle choices and visits with your health care provider that can promote health and wellness. What does preventive care include?  A  yearly physical exam. This is also called an annual well check.  Dental exams once or twice a year.  Routine eye exams. Ask your health care provider how often you should have your eyes checked.  Personal lifestyle choices, including:  Daily care of your teeth and gums.  Regular physical activity.  Eating a healthy diet.  Avoiding tobacco and drug use.  Limiting alcohol use.  Practicing safe sex.  Taking low-dose aspirin every day.  Taking vitamin and mineral supplements as recommended by your health care provider. What happens during an annual well check? The services and screenings done by your health care provider during your annual well check will depend on your age, overall health, lifestyle risk factors, and family history of disease. Counseling  Your health care provider may ask you questions about your:  Alcohol use.  Tobacco use.  Drug use.  Emotional well-being.  Home and relationship well-being.  Sexual activity.  Eating habits.  History of falls.  Memory and ability to understand (cognition).  Work and work Statistician.  Reproductive health. Screening  You may have the following tests or measurements:  Height, weight, and BMI.  Blood pressure.  Lipid and cholesterol levels. These may be checked every 5 years, or more frequently if you are over 53 years old.  Skin check.  Lung cancer screening. You may have this screening every year starting at age 44 if you have a 30-pack-year history of smoking and currently smoke or have quit within the past 15 years.  Fecal occult blood test (FOBT) of  the stool. You may have this test every year starting at age 32.  Flexible sigmoidoscopy or colonoscopy. You may have a sigmoidoscopy every 5 years or a colonoscopy every 10 years starting at age 58.  Hepatitis C blood test.  Hepatitis B blood test.  Sexually transmitted disease (STD) testing.  Diabetes screening. This is done by checking your blood  sugar (glucose) after you have not eaten for a while (fasting). You may have this done every 1-3 years.  Bone density scan. This is done to screen for osteoporosis. You may have this done starting at age 90.  Mammogram. This may be done every 1-2 years. Talk to your health care provider about how often you should have regular mammograms. Talk with your health care provider about your test results, treatment options, and if necessary, the need for more tests. Vaccines  Your health care provider may recommend certain vaccines, such as:  Influenza vaccine. This is recommended every year.  Tetanus, diphtheria, and acellular pertussis (Tdap, Td) vaccine. You may need a Td booster every 10 years.  Zoster vaccine. You may need this after age 41.  Pneumococcal 13-valent conjugate (PCV13) vaccine. One dose is recommended after age 18.  Pneumococcal polysaccharide (PPSV23) vaccine. One dose is recommended after age 84. Talk to your health care provider about which screenings and vaccines you need and how often you need them. This information is not intended to replace advice given to you by your health care provider. Make sure you discuss any questions you have with your health care provider. Document Released: 01/04/2016 Document Revised: 08/27/2016 Document Reviewed: 10/09/2015 Elsevier Interactive Patient Education  2017 [place] Prevention in the Home Falls can cause injuries. They can happen to people of all ages. There are many things you can do to make your home safe and to help prevent falls. What can I do on the outside of my home?  Regularly fix the edges of walkways and driveways and fix any cracks.  Remove anything that might make you trip as you walk through a door, such as a raised step or threshold.  Trim any bushes or trees on the path to your home.  Use bright outdoor lighting.  Clear any walking paths of anything that might make someone trip, such as rocks or  tools.  Regularly check to see if handrails are loose or broken. Make sure that both sides of any steps have handrails.  Any raised decks and porches should have guardrails on the edges.  Have any leaves, snow, or ice cleared regularly.  Use sand or salt on walking paths during winter.  Clean up any spills in your garage right away. This includes oil or grease spills. What can I do in the bathroom?  Use night lights.  Install grab bars by the toilet and in the tub and shower. Do not use towel bars as grab bars.  Use non-skid mats or decals in the tub or shower.  If you need to sit down in the shower, use a plastic, non-slip stool.  Keep the floor dry. Clean up any water that spills on the floor as soon as it happens.  Remove soap buildup in the tub or shower regularly.  Attach bath mats securely with double-sided non-slip rug tape.  Do not have throw rugs and other things on the floor that can make you trip. What can I do in the bedroom?  Use night lights.  Make sure that you have a light by your  bed that is easy to reach.  Do not use any sheets or blankets that are too big for your bed. They should not hang down onto the floor.  Have a firm chair that has side arms. You can use this for support while you get dressed.  Do not have throw rugs and other things on the floor that can make you trip. What can I do in the kitchen?  Clean up any spills right away.  Avoid walking on wet floors.  Keep items that you use a lot in easy-to-reach places.  If you need to reach something above you, use a strong step stool that has a grab bar.  Keep electrical cords out of the way.  Do not use floor polish or wax that makes floors slippery. If you must use wax, use non-skid floor wax.  Do not have throw rugs and other things on the floor that can make you trip. What can I do with my stairs?  Do not leave any items on the stairs.  Make sure that there are handrails on both  sides of the stairs and use them. Fix handrails that are broken or loose. Make sure that handrails are as long as the stairways.  Check any carpeting to make sure that it is firmly attached to the stairs. Fix any carpet that is loose or worn.  Avoid having throw rugs at the top or bottom of the stairs. If you do have throw rugs, attach them to the floor with carpet tape.  Make sure that you have a light switch at the top of the stairs and the bottom of the stairs. If you do not have them, ask someone to add them for you. What else can I do to help prevent falls?  Wear shoes that:  Do not have high heels.  Have rubber bottoms.  Are comfortable and fit you well.  Are closed at the toe. Do not wear sandals.  If you use a stepladder:  Make sure that it is fully opened. Do not climb a closed stepladder.  Make sure that both sides of the stepladder are locked into place.  Ask someone to hold it for you, if possible.  Clearly mark and make sure that you can see:  Any grab bars or handrails.  First and last steps.  Where the edge of each step is.  Use tools that help you move around (mobility aids) if they are needed. These include:  Canes.  Walkers.  Scooters.  Crutches.  Turn on the lights when you go into a dark area. Replace any light bulbs as soon as they burn out.  Set up your furniture so you have a clear path. Avoid moving your furniture around.  If any of your floors are uneven, fix them.  If there are any pets around you, be aware of where they are.  Review your medicines with your doctor. Some medicines can make you feel dizzy. This can increase your chance of falling. Ask your doctor what other things that you can do to help prevent falls. This information is not intended to replace advice given to you by your health care provider. Make sure you discuss any questions you have with your health care provider. Document Released: 10/04/2009 Document Revised:  05/15/2016 Document Reviewed: 01/12/2015 Elsevier Interactive Patient Education  2017 [employer].

## 2020-07-10 NOTE — Progress Notes (Addendum)
Subjective:   Veronica Ferrell is a 69 y.o. female who presents for Medicare Annual (Subsequent) preventive examination.  Review of Systems    No ROS.  Medicare Wellness Virtual Visit.   Cardiac Risk Factors include: advanced age (>75men, >91 women)     Objective:    Today's Vitals   07/10/20 0930  BP: 97/66  Pulse: 86  Weight: 123 lb (55.8 kg)  Height: 5\' 4"  (1.626 m)   Body mass index is 21.11 kg/m.  Advanced Directives 07/10/2020 07/14/2019 06/20/2019 07/30/2018 06/17/2018 04/10/2017  Does Patient Have a Medical Advance Directive? Yes Yes Yes No Yes Yes  Type of Paramedic of [place];Living will Living will [place];Living will - [place];Living will [place]  Does patient want to make changes to medical advance directive? No - Patient declined - No - Patient declined - No - Patient declined No - Patient declined  Copy of [place] in Chart? No - copy requested - No - copy requested - No - copy requested No - copy requested    Current Medications (verified) Outpatient Encounter Medications as of 07/10/2020  Medication Sig   ALPRAZolam (XANAX) 1 MG tablet Take 1 tablet (1 mg total) by mouth at bedtime as needed for anxiety.   aspirin 81 MG tablet Take 81 mg by mouth once a week.    calcium citrate (CALCITRATE - DOSED IN MG ELEMENTAL CALCIUM) 950 MG tablet Take 1 tablet by mouth daily.   Cholecalciferol (VITAMIN D3) 1000 UNITS CAPS Take 1 capsule by mouth daily.   diclofenac sodium (VOLTAREN) 1 % GEL Apply 2 g topically 4 (four) times daily.   estradiol (ESTRACE) 1 MG tablet TAKE ONE TABLET BY MOUTH EVERY DAY   fludrocortisone (FLORINEF) 0.1 MG tablet Take 1 tablet (100 mcg total) by mouth daily.   hydrocortisone (CORTEF) 20 MG tablet TAKE ONE TABLET EACH MORNING AND ONE-HALF TABLET EVERY NIGHT AS DIRECTED   ipratropium (ATROVENT) 0.06 % nasal spray USE TWO SPRAYS INTO  EACH NOSTRIL FOUR TIMES A DAY   Lactulose 20 GM/30ML SOLN 30 ml every 4 hours until constipation is relieved   mirtazapine (REMERON) 7.5 MG tablet Take 3.75-7.5 mg by mouth at bedtime.   Multiple Vitamins-Minerals (MULTIVITAMIN WITH MINERALS) tablet Take 1 tablet by mouth daily.   omeprazole (PRILOSEC) 40 MG capsule TAKE 1 CAPSULE BY MOUTH EVERY DAY   polyethylene glycol powder (GLYCOLAX/MIRALAX) powder USE AS DIRECTED   promethazine (PHENERGAN) 12.5 MG tablet Take 1 tablet (12.5 mg total) by mouth every 6 (six) hours as needed for nausea or vomiting.   triamcinolone cream (KENALOG) 0.1 % Apply 1 application topically 2 (two) times daily.   vitamin C (ASCORBIC ACID) 500 MG tablet Take 500 mg by mouth daily.   No facility-administered encounter medications on file as of 07/10/2020.    Allergies (verified) Alendronate sodium, Boniva [ibandronic acid], Clindamycin/lincomycin, Tramadol, and Penicillins   History: Past Medical History:  Diagnosis Date   Addison's disease ([place])    Arthritis    fingers   Cancer ([place])    skin   Chicken pox    Colon polyps    Diverticulitis    GERD (gastroesophageal reflux disease)    Past Surgical History:  Procedure Laterality Date   ABDOMINAL HYSTERECTOMY     APPENDECTOMY     BREAST BIOPSY Right 2010   neg   BREAST SURGERY     COLONOSCOPY WITH PROPOFOL N/A 07/14/2019  Procedure: COLONOSCOPY WITH PROPOFOL;  Surgeon: Lucilla Lame, MD;  Location: [facility] ENDOSCOPY;  Service: Endoscopy;  Laterality: N/A;   ESOPHAGOGASTRODUODENOSCOPY (EGD) WITH PROPOFOL N/A 04/10/2017   Procedure: ESOPHAGOGASTRODUODENOSCOPY (EGD) WITH PROPOFOL;  Surgeon: Lucilla Lame, MD;  Location: [facility];  Service: Endoscopy;  Laterality: N/A;  requeests early as possible   FOOT SURGERY Bilateral    varicose veins     Family History  Problem Relation Age of Onset   Cancer Mother 61       Breast Ca  colon Ca (58)  and brain Ca (64)   Breast cancer  Mother 50   Cancer Father    Cancer Maternal Aunt        breast   Breast cancer Maternal Aunt    Cancer Maternal Grandmother        breast   Breast cancer Maternal Grandmother    Cancer Maternal Aunt        breast ca   Breast cancer Maternal Aunt    Social History   Socioeconomic History   Marital status: Married    Spouse name: Not on file   Number of children: Not on file   Years of education: Not on file   Highest education level: Not on file  Occupational History   Not on file  Tobacco Use   Smoking status: Never Smoker   Smokeless tobacco: Never Used  Vaping Use   Vaping Use: Never used  Substance and Sexual Activity   Alcohol use: Yes    Alcohol/week: 7.0 standard drinks    Types: 7 Glasses of wine per week    Comment: WINE EACH WEEK   Drug use: No   Sexual activity: Yes  Other Topics Concern   Not on file  Social History Narrative   Not on file   Social Determinants of Health   Financial Resource Strain:    Difficulty of Paying Living Expenses:   Food Insecurity:    Worried About Charity fundraiser in the Last Year:    Arboriculturist in the Last Year:   Transportation Needs:    Film/video editor (Medical):    Lack of Transportation (Non-Medical):   Physical Activity:    Days of Exercise per Week:    Minutes of Exercise per Session:   Stress:    Feeling of Stress :   Social Connections:    Frequency of Communication with Friends and Family:    Frequency of Social Gatherings with Friends and Family:    Attends Religious Services:    Active Member of Clubs or Organizations:    Attends Music therapist:    Marital Status:     Tobacco Counseling Counseling given: Not Answered   Clinical Intake:  Pre-visit preparation completed: Yes        Diabetes: No  How often do you need to have someone help you when you read instructions, pamphlets, or other written materials from your doctor or  pharmacy?: 1 - Never  Interpreter Needed?: No      Activities of Daily Living In your present state of health, do you have any difficulty performing the following activities: 07/10/2020  Hearing? N  Vision? N  Difficulty concentrating or making decisions? N  Walking or climbing stairs? N  Dressing or bathing? N  Doing errands, shopping? N  Preparing Food and eating ? N  Using the Toilet? N  In the past six months, have you accidently leaked urine? N  Do  you have problems with loss of bowel control? N  Managing your Medications? N  Managing your Finances? N  Housekeeping or managing your Housekeeping? N  Some recent data might be hidden    Patient Care Team: Crecencio Mc, MD as PCP - General (Internal Medicine)  Indicate any recent Medical Services you may have received from other than [facility] providers in the past year (date may be approximate).     Assessment:   This is a routine wellness examination for [facility].  I connected with Abri today by telephone and verified that I am speaking with the correct person using two identifiers. Location patient: home Location provider: work Persons participating in the virtual visit: patient, Marine scientist.    I discussed the limitations, risks, security and privacy concerns of performing an evaluation and management service by telephone and the availability of in person appointments. The patient expressed understanding and verbally consented to this telephonic visit.    Interactive audio and video telecommunications were attempted between this provider and patient, however failed, due to patient having technical difficulties OR patient did not have access to video capability.  We continued and completed visit with audio only.  Some vital signs may be absent or patient reported.   Hearing/Vision screen  Hearing Screening   125Hz  250Hz  500Hz  1000Hz  2000Hz  3000Hz  4000Hz  6000Hz  8000Hz   Right ear:           Left ear:           Comments: Patient  is able to hear conversational tones without difficulty.  No issues reported.   Vision Screening Comments: Followed by [facility] Wears reader lenses Visual acuity not assessed, virtual visit.  They have seen their ophthalmologist in the last 12 months.     Dietary issues and exercise activities discussed: Healthy diet Good fluid intake Current Exercise Habits: Home exercise routine, Intensity: Mild  Goals      Patient Stated     Maintain Healthy Lifestyle (pt-stated)      Healthy diet Stay active Stay hydrated Follow up with pcp as needed      Depression Screen PHQ 2/9 Scores 07/10/2020 06/20/2019 06/17/2018 03/25/2017 01/30/2016 09/11/2014  PHQ - 2 Score 0 0 0 0 0 0    Fall Risk Fall Risk  07/10/2020 06/13/2020 05/09/2020 06/20/2019 06/17/2018  Falls in the past year? 0 0 0 0 No  Number falls in past yr: 0 - - - -  Follow up Falls evaluation completed Falls evaluation completed Falls evaluation completed - -   Handrails in use when climbing stairs? Yes  Home free of loose throw rugs in walkways, pet beds, electrical cords, etc? Yes  Adequate lighting in your home to reduce risk of falls? Yes   ASSISTIVE DEVICES UTILIZED TO PREVENT FALLS:  Life alert? No  Use of a cane, walker or w/c? No  Grab bars in the bathroom? No  Shower chair or bench in shower? No  Elevated toilet seat or a handicapped toilet? No   TIMED UP AND GO: Was the test performed? No . Virtual visit.   Cognitive Function: Patient is alert and oriented x3.   MMSE - Mini Mental State Exam 07/10/2020 06/17/2018  Not completed: Unable to complete -  Orientation to time - 5  Orientation to Place - 5  Registration - 3  Attention/ Calculation - 5  Recall - 3  Language- name 2 objects - 2  Language- repeat - 1  Language- follow 3 step command - 3  Language- read & follow direction - 1  Write a sentence - 1  Copy design - 1  Total score - 30     6CIT Screen 06/20/2019  What Year? 0 points  What  month? 0 points  What time? 0 points  Count back from 20 0 points  Months in reverse 0 points  Repeat phrase 0 points  Total Score 0   Immunizations Immunization History  Administered Date(s) Administered   Fluad Quad(high Dose 65+) 09/16/2019   Influenza Split 09/21/2013, 09/19/2015, 08/30/2017   Influenza,inj,Quad PF,6+ Mos 09/11/2014   Influenza-Unspecified 08/22/2018   PFIZER SARS-COV-2 Vaccination 01/13/2020, 02/03/2020   Pneumococcal Conjugate-13 09/11/2014   Pneumococcal Polysaccharide-23 03/08/2012, 03/30/2018   Tdap 03/09/2011   Zoster 03/08/2012   Zoster Recombinat (Shingrix) 08/24/2018, 11/19/2018    Health Maintenance There are no preventive care reminders to display for this patient. Health Maintenance  Topic Date Due   INFLUENZA VACCINE  07/22/2020   MAMMOGRAM  02/15/2021   TETANUS/TDAP  03/08/2021   COLONOSCOPY  07/13/2024   DEXA SCAN  Completed   COVID-19 Vaccine  Completed   Hepatitis C Screening  Completed   PNA vac Low Risk Adult  Completed    Dental Screening: Recommended annual dental exams for proper oral hygiene  Community Resource Referral / Chronic Care Management: CRR required this visit?  No   CCM required this visit?  No      Plan:   Keep all routine maintenance appointments.   Cpe 06/18/21  I have personally reviewed and noted the following in the patients chart:    Medical and social history  Use of alcohol, tobacco or illicit drugs   Current medications and supplements  Functional ability and status  Nutritional status  Physical activity  Advanced directives  List of other physicians  Hospitalizations, surgeries, and ER visits in previous 12 months  Vitals  Screenings to include cognitive, depression, and falls  Referrals and appointments  In addition, I have reviewed and discussed with patient certain preventive protocols, quality metrics, and best practice recommendations. A written  personalized care plan for preventive services as well as general preventive health recommendations were provided to patient via mychart.     OBrien-Blaney, Teshara Moree L, LPN   6/37/8588     I have reviewed the above information and agree with above.   Deborra Medina, MD

## 2020-07-23 ENCOUNTER — Other Ambulatory Visit: Payer: Self-pay | Admitting: Internal Medicine

## 2020-08-28 ENCOUNTER — Other Ambulatory Visit: Payer: Self-pay | Admitting: Internal Medicine

## 2020-09-13 ENCOUNTER — Encounter: Payer: Self-pay | Admitting: Internal Medicine

## 2020-09-13 DIAGNOSIS — E559 Vitamin D deficiency, unspecified: Secondary | ICD-10-CM | POA: Insufficient documentation

## 2020-10-29 DIAGNOSIS — H43813 Vitreous degeneration, bilateral: Secondary | ICD-10-CM | POA: Diagnosis not present

## 2020-11-22 ENCOUNTER — Other Ambulatory Visit: Payer: Self-pay | Admitting: Internal Medicine

## 2020-11-23 ENCOUNTER — Other Ambulatory Visit: Payer: Self-pay | Admitting: Internal Medicine

## 2020-12-16 DIAGNOSIS — Z20822 Contact with and (suspected) exposure to covid-19: Secondary | ICD-10-CM | POA: Diagnosis not present

## 2020-12-17 ENCOUNTER — Other Ambulatory Visit: Payer: Medicare PPO

## 2020-12-24 ENCOUNTER — Other Ambulatory Visit: Payer: Self-pay | Admitting: Internal Medicine

## 2020-12-24 DIAGNOSIS — K222 Esophageal obstruction: Secondary | ICD-10-CM

## 2020-12-24 DIAGNOSIS — K219 Gastro-esophageal reflux disease without esophagitis: Secondary | ICD-10-CM

## 2020-12-25 ENCOUNTER — Other Ambulatory Visit: Payer: Self-pay | Admitting: Internal Medicine

## 2020-12-26 ENCOUNTER — Encounter: Payer: Self-pay | Admitting: Internal Medicine

## 2020-12-26 ENCOUNTER — Telehealth: Payer: Medicare PPO | Admitting: Internal Medicine

## 2020-12-26 VITALS — Ht 64.0 in | Wt 123.0 lb

## 2020-12-26 DIAGNOSIS — S060XAA Concussion with loss of consciousness status unknown, initial encounter: Secondary | ICD-10-CM | POA: Insufficient documentation

## 2020-12-26 DIAGNOSIS — S060X0A Concussion without loss of consciousness, initial encounter: Secondary | ICD-10-CM

## 2020-12-26 DIAGNOSIS — S060X9A Concussion with loss of consciousness of unspecified duration, initial encounter: Secondary | ICD-10-CM | POA: Insufficient documentation

## 2020-12-26 DIAGNOSIS — R519 Headache, unspecified: Secondary | ICD-10-CM | POA: Diagnosis not present

## 2020-12-26 HISTORY — DX: Concussion with loss of consciousness status unknown, initial encounter: S06.0XAA

## 2020-12-26 NOTE — Patient Instructions (Signed)
Concussion, Adult  A concussion is a brain injury from a hard, direct hit (trauma) to the head or body. This direct hit causes the brain to shake quickly back and forth inside the skull. This can damage brain cells and cause chemical changes in the brain. A concussion may also be known as a mild traumatic brain injury (TBI). Concussions are usually not life-threatening, but the effects of a concussion can be serious. If you have a concussion, you should be very careful to avoid having a second concussion. What are the causes? This condition is caused by:  A direct hit to your head, such as: ? Running into another player during a game. ? Being hit in a fight. ? Hitting your head on a hard surface.  Sudden movement of your body that causes your brain to move back and forth inside the skull, such as in a car crash. What are the signs or symptoms? The signs of a concussion can be hard to notice. Early on, they may be missed by you, family members, and health care providers. You may look fine on the outside but may act or feel differently. Symptoms are usually temporary and most often improve in 7-10 days. Some symptoms appear right away, but other symptoms may not show up for hours or days. If your symptoms last longer than normal, you may have post-concussion syndrome. Every head injury is different. Physical symptoms  Headaches. This can include a feeling of pressure in the head or migraine-like symptoms.  Tiredness (fatigue).  Dizziness.  Problems with coordination or balance.  Vision or hearing problems.  Sensitivity to light or noise.  Nausea or vomiting.  Changes in eating or sleeping patterns.  Numbness or tingling.  Seizure. Mental and emotional symptoms  Memory problems.  Trouble concentrating, organizing, or making decisions.  Slowness in thinking, acting or reacting, speaking, or reading.  Irritability or mood changes.  Anxiety or depression. How is this  diagnosed? This condition is diagnosed based on:  Your symptoms.  A description of your injury. You may also have tests, including:  Imaging tests, such as a CT scan or MRI.  Neuropsychological tests. These measure your thinking, understanding, learning, and remembering abilities. How is this treated? Treatment for this condition includes:  Stopping sports or activity if you are injured. If you hit your head or show signs of concussion: ? Do not return to sports or activities the same day. ? Get checked by a health care provider before you return to your activities.  Physical and mental rest and careful observation, usually at home. Gradually return to your normal activities.  Medicines to help with symptoms such as headaches, nausea, or difficulty sleeping. ? Avoid taking opioid pain medicine while recovering from a concussion.  Avoiding alcohol and drugs. These may slow your recovery and can put you at risk of further injury.  Referral to a concussion clinic or rehabilitation center. Recovery from a concussion can take time. How fast you recover depends on many factors. Return to activities only when:  Your symptoms are completely gone.  Your health care provider says that it is safe. Follow these instructions at home: Activity  Limit activities that require a lot of thought or concentration, such as: ? Doing homework or job-related work. ? Watching TV. ? Working on the computer or phone. ? Playing memory games and puzzles.  Rest. Rest helps your brain heal. Make sure you: ? Get plenty of sleep. Most adults should get 7-9 hours of sleep   each night. ? Rest during the day. Take naps or rest breaks when you feel tired.  Avoid physical activity like exercise until your health care provider says it is safe. Stop any activity that worsens symptoms.  Do not do high-risk activities that could cause a second concussion, such as riding a bike or playing sports.  Ask your  health care provider when you can return to your normal activities, such as school, work, athletics, and driving. Your ability to react may be slower after a brain injury. Never do these activities if you are dizzy. Your health care provider will likely give you a plan for gradually returning to activities. General instructions   Take over-the-counter and prescription medicines only as told by your health care provider. Some medicines, such as blood thinners (anticoagulants) and aspirin, may increase the risk for complications, such as bleeding.  Do not drink alcohol until your health care provider says you can.  Watch your symptoms and tell others around you to do the same. Complications sometimes occur after a concussion. Older adults with a brain injury may have a higher risk of serious complications.  Tell your work manager, teachers, school nurse, school counselor, coach, or athletic trainer about your injury, symptoms, and restrictions.  Keep all follow-up visits as told by your health care provider. This is important. How is this prevented? Avoiding another brain injury is very important. In rare cases, another injury can lead to permanent brain damage, brain swelling, or death. The risk of this is greatest during the first 7-10 days after a head injury. Avoid injuries by:  Stopping activities that could lead to a second concussion, such as contact or recreational sports, until your health care provider says it is okay.  Taking these actions once you have returned to sports or activities: ? Avoiding plays or moves that can cause you to crash into another person. This is how most concussions occur. ? Following the rules and being respectful of other players. Do not engage in violent or illegal plays.  Getting regular exercise that includes strength and balance training.  Wearing a properly fitting helmet during sports, biking, or other activities. Helmets can help protect you from  serious skull and brain injuries, but they do not protect you from a concussion. Even when wearing a helmet, you should avoid being hit in the head. Contact a health care provider if:  Your symptoms get worse or they do not improve.  You have new symptoms.  You have another injury. Get help right away if:  You have severe or worsening headaches.  You have weakness or numbness in any part of your body.  You are confused.  Your coordination gets worse.  You vomit repeatedly.  You are sleepier than normal.  Your speech is slurred.  You cannot recognize people or places.  You have a seizure.  It is difficult to wake you up.  You have unusual behavior changes.  You have changes in your vision.  You lose consciousness. Summary  A concussion is a brain injury that results from a hard, direct hit (trauma) to your head or body.  You may have imaging tests and neuropsychological tests to diagnose a concussion.  Treatment for this condition includes physical and mental rest and careful observation.  Ask your health care provider when you can return to your normal activities, such as school, work, athletics, and driving.  Get help right away if you have a severe headache, weakness on one side of the   body, seizures, behavior changes, changes in vision, or if you are confused or sleepier than normal. This information is not intended to replace advice given to you by your health care provider. Make sure you discuss any questions you have with your health care provider. Document Revised: 07/29/2018 Document Reviewed: 07/29/2018 Elsevier Patient Education  2020 [employer]vier Inc[employer]  

## 2020-12-26 NOTE — Progress Notes (Signed)
Virtual Visit via Caregility   This visit type was conducted due to national recommendations for restrictions regarding the COVID-19 pandemic (e.g. social distancing).  This format is felt to be most appropriate for this patient at this time.  All issues noted in this document were discussed and addressed.  No physical exam was performed (except for noted visual exam findings with Video Visits).   I connected with@ on 12/26/20 at  9:30 AM EST by a video enabled telemedicine application  and verified that I am speaking with the correct person using two identifiers. Location patient: home Location provider: work or home office Persons participating in the virtual visit: patient, provider  I discussed the limitations, risks, security and privacy concerns of performing an evaluation and management service by telephone and the availability of in person appointments. I also discussed with the patient that there may be a patient responsible charge related to this service. The patient expressed understanding and agreed to proceed.  Reason for visit: post traumatic headaches  HPI:  70 yr old female with Addison's disease presents with recurrent headaches for the past month .  Started after a fall I in her garage ; she tripped over a leaf blower and fell backward ,  Hitting head on concrete floor.   No laceration,  No LOC.  Takes aspirin once a week 81 mg. . Has had recurrent headaches occipital area accompanied by nausea ,  Or the past month. Not daily  reliieved with advil 600 mg.   Denies,  vertigo  , nausea.    ROS: See pertinent positives and negatives per HPI.  Past Medical History:  Diagnosis Date  . Addison's disease (HCC)   . Arthritis    fingers  . Cancer (HCC)    skin  . Chicken pox   . Colon polyps   . Diverticulitis   . GERD (gastroesophageal reflux disease)     Past Surgical History:  Procedure Laterality Date  . ABDOMINAL HYSTERECTOMY    . APPENDECTOMY    . BREAST BIOPSY  Right 2010   neg  . BREAST SURGERY    . COLONOSCOPY WITH PROPOFOL N/A 07/14/2019   Procedure: COLONOSCOPY WITH PROPOFOL;  Surgeon: [name], MD;  Location: [facility];  Service: Endoscopy;  Laterality: N/A;  . ESOPHAGOGASTRODUODENOSCOPY (EGD) WITH PROPOFOL N/A 04/10/2017   Procedure: ESOPHAGOGASTRODUODENOSCOPY (EGD) WITH PROPOFOL;  Surgeon: [name], MD;  Location: [facility];  Service: Endoscopy;  Laterality: N/A;  requeests early as possible  . FOOT SURGERY Bilateral   . varicose veins      Family History  Problem Relation Age of Onset  . Cancer Mother 67       Breast Ca  colon Ca (58)  and brain Ca (69)  . Breast cancer Mother 70  . Cancer Father   . Cancer Maternal Aunt        breast  . Breast cancer Maternal Aunt   . Cancer Maternal Grandmother        breast  . Breast cancer Maternal Grandmother   . Cancer Maternal Aunt        breast ca  . Breast cancer Maternal Aunt     SOCIAL HX: reports that she has never smoked. She has never used smokeless tobacco. She reports current alcohol use of about 7.0 standard drinks of alcohol per week. She reports that she does not use drugs.   Current Outpatient Medications:  .  ALPRAZolam (XANAX) 1 MG tablet, Take 1 tablet (1 mg  total) by mouth at bedtime as needed for anxiety., Disp: 30 tablet, Rfl: 5 .  aspirin 81 MG tablet, Take 81 mg by mouth once a week. , Disp: , Rfl:  .  calcium citrate (CALCITRATE - DOSED IN MG ELEMENTAL CALCIUM) 950 MG tablet, Take 1 tablet by mouth daily., Disp: , Rfl:  .  Cholecalciferol (VITAMIN D3) 1000 UNITS CAPS, Take 1 capsule by mouth daily., Disp: , Rfl:  .  diclofenac sodium (VOLTAREN) 1 % GEL, Apply 2 g topically 4 (four) times daily., Disp: 150 g, Rfl: 1 .  estradiol (ESTRACE) 1 MG tablet, TAKE ONE TABLET BY MOUTH EVERY DAY, Disp: 90 tablet, Rfl: 3 .  fludrocortisone (FLORINEF) 0.1 MG tablet, TAKE 1 TABLET BY MOUTH DAILY., Disp: 90 tablet, Rfl: 1 .  hydrocortisone (CORTEF) 20 MG  tablet, TAKE ONE TABLET EACH MORNING AND ONE-HALF TABLET EVERY NIGHT AS DIRECTED, Disp: 135 tablet, Rfl: 0 .  ipratropium (ATROVENT) 0.06 % nasal spray, USE TWO SPRAYS INTO EACH NOSTRIL FOUR TIMES A DAY, Disp: 15 mL, Rfl: 2 .  Lactulose 20 GM/30ML SOLN, 30 ml every 4 hours until constipation is relieved, Disp: 236 mL, Rfl: 3 .  mirtazapine (REMERON) 7.5 MG tablet, TAKE 1/2-1 TABLET BY MOUTH 30 MINUTES BEFORE BEDTIME, Disp: 60 tablet, Rfl: 0 .  Multiple Vitamins-Minerals (MULTIVITAMIN WITH MINERALS) tablet, Take 1 tablet by mouth daily., Disp: , Rfl:  .  omeprazole (PRILOSEC) 40 MG capsule, TAKE 1 CAPSULE BY MOUTH EVERY DAY, Disp: 90 capsule, Rfl: 1 .  polyethylene glycol powder (GLYCOLAX/MIRALAX) powder, USE AS DIRECTED, Disp: 527 g, Rfl: 1 .  promethazine (PHENERGAN) 12.5 MG tablet, Take 1 tablet (12.5 mg total) by mouth every 6 (six) hours as needed for nausea or vomiting., Disp: 30 tablet, Rfl: 0 .  triamcinolone cream (KENALOG) 0.1 %, Apply 1 application topically 2 (two) times daily., Disp: 453.6 g, Rfl: 0 .  vitamin C (ASCORBIC ACID) 500 MG tablet, Take 500 mg by mouth daily., Disp: , Rfl:   EXAM:  VITALS per patient if applicable:  GENERAL: alert, oriented, appears well and in no acute distress  HEENT: atraumatic, conjunttiva clear, no obvious abnormalities on inspection of external nose and ears  NECK: normal movements of the head and neck  LUNGS: on inspection no signs of respiratory distress, breathing rate appears normal, no obvious gross SOB, gasping or wheezing  CV: no obvious cyanosis  MS: moves all visible extremities without noticeable abnormality  PSYCH/NEURO: pleasant and cooperative, no obvious depression or anxiety, speech and thought processing grossly intact  ASSESSMENT AND PLAN:  Discussed the following assessment and plan:  Concussion without loss of consciousness, initial encounter - Plan: CT Head Wo Contrast, DG Cervical Spine Complete  Recurrent occipital  headache - Plan: CT Head Wo Contrast, DG Cervical Spine Complete  Concussion Secondary to blunt trauma to occipital area during a fall onto concrete one month ago.  Recurrent daily occipital headaches: CT head needed to rule out SDH .  Cervical spine films also ordered to rule out pedicle/facet fracture. Concussion care outlined including brain rest.     I discussed the assessment and treatment plan with the patient. The patient was provided an opportunity to ask questions and all were answered. The patient agreed with the plan and demonstrated an understanding of the instructions.   The patient was advised to call back or seek an in-person evaluation if the symptoms worsen or if the condition fails to improve as anticipated.30  I provided 30 minutes of  face-to-face time during this encounter.   Sherlene Shams, MD

## 2020-12-27 NOTE — Assessment & Plan Note (Addendum)
Secondary to blunt trauma to occipital area during a fall onto concrete one month ago.  Recurrent daily occipital headaches: CT head needed to rule out SDH .  Cervical spine films also ordered to rule out pedicle/facet fracture. Concussion care outlined including brain rest.

## 2021-01-03 ENCOUNTER — Ambulatory Visit
Admission: RE | Admit: 2021-01-03 | Discharge: 2021-01-03 | Disposition: A | Discharge status: Home / Self Care | Payer: Medicare PPO | Source: Ambulatory Visit | Attending: Internal Medicine | Admitting: Internal Medicine

## 2021-01-03 ENCOUNTER — Other Ambulatory Visit: Payer: Self-pay

## 2021-01-03 DIAGNOSIS — R519 Headache, unspecified: Secondary | ICD-10-CM

## 2021-01-03 DIAGNOSIS — S060X0A Concussion without loss of consciousness, initial encounter: Secondary | ICD-10-CM

## 2021-01-03 DIAGNOSIS — Z043 Encounter for examination and observation following other accident: Secondary | ICD-10-CM | POA: Diagnosis not present

## 2021-01-03 DIAGNOSIS — M47812 Spondylosis without myelopathy or radiculopathy, cervical region: Secondary | ICD-10-CM | POA: Diagnosis not present

## 2021-01-03 NOTE — Progress Notes (Signed)
Veronica Ferrell,  Your head CT and cervical spine films were all negative for acute fractures and bleeds .  I hope you are recovering from your concussion steadily..  If not, let me know so we can send you to a neurologist.  Regards,   Deborra Medina, MD

## 2021-01-09 ENCOUNTER — Other Ambulatory Visit: Payer: Medicare PPO

## 2021-01-15 ENCOUNTER — Other Ambulatory Visit: Payer: Self-pay | Admitting: Internal Medicine

## 2021-01-15 DIAGNOSIS — Z1231 Encounter for screening mammogram for malignant neoplasm of breast: Secondary | ICD-10-CM

## 2021-01-23 ENCOUNTER — Other Ambulatory Visit: Payer: Self-pay | Admitting: Internal Medicine

## 2021-01-30 DIAGNOSIS — Z85828 Personal history of other malignant neoplasm of skin: Secondary | ICD-10-CM | POA: Diagnosis not present

## 2021-01-30 DIAGNOSIS — D2261 Melanocytic nevi of right upper limb, including shoulder: Secondary | ICD-10-CM | POA: Diagnosis not present

## 2021-01-30 DIAGNOSIS — L820 Inflamed seborrheic keratosis: Secondary | ICD-10-CM | POA: Diagnosis not present

## 2021-01-30 DIAGNOSIS — L821 Other seborrheic keratosis: Secondary | ICD-10-CM | POA: Diagnosis not present

## 2021-01-30 DIAGNOSIS — L84 Corns and callosities: Secondary | ICD-10-CM | POA: Diagnosis not present

## 2021-01-30 DIAGNOSIS — L905 Scar conditions and fibrosis of skin: Secondary | ICD-10-CM | POA: Diagnosis not present

## 2021-01-30 DIAGNOSIS — R208 Other disturbances of skin sensation: Secondary | ICD-10-CM | POA: Diagnosis not present

## 2021-01-30 DIAGNOSIS — D485 Neoplasm of uncertain behavior of skin: Secondary | ICD-10-CM | POA: Diagnosis not present

## 2021-01-30 DIAGNOSIS — D1801 Hemangioma of skin and subcutaneous tissue: Secondary | ICD-10-CM | POA: Diagnosis not present

## 2021-01-30 DIAGNOSIS — Q828 Other specified congenital malformations of skin: Secondary | ICD-10-CM | POA: Diagnosis not present

## 2021-02-18 ENCOUNTER — Ambulatory Visit
Admission: RE | Admit: 2021-02-18 | Discharge: 2021-02-18 | Disposition: A | Discharge status: Home / Self Care | Payer: Medicare PPO | Source: Ambulatory Visit | Attending: Internal Medicine | Admitting: Internal Medicine

## 2021-02-18 ENCOUNTER — Other Ambulatory Visit: Payer: Self-pay

## 2021-02-18 DIAGNOSIS — Z1231 Encounter for screening mammogram for malignant neoplasm of breast: Secondary | ICD-10-CM | POA: Insufficient documentation

## 2021-02-20 ENCOUNTER — Other Ambulatory Visit: Payer: Self-pay | Admitting: Internal Medicine

## 2021-02-27 ENCOUNTER — Encounter: Payer: Self-pay | Admitting: Internal Medicine

## 2021-02-27 ENCOUNTER — Other Ambulatory Visit: Payer: Self-pay

## 2021-02-27 ENCOUNTER — Ambulatory Visit: Payer: Medicare PPO | Admitting: Internal Medicine

## 2021-02-27 VITALS — BP 88/60 | HR 82 | Temp 98.1°F | Resp 14 | Ht 64.0 in | Wt 121.6 lb

## 2021-02-27 DIAGNOSIS — M5030 Other cervical disc degeneration, unspecified cervical region: Secondary | ICD-10-CM | POA: Diagnosis not present

## 2021-02-27 DIAGNOSIS — R519 Headache, unspecified: Secondary | ICD-10-CM | POA: Diagnosis not present

## 2021-02-27 DIAGNOSIS — M542 Cervicalgia: Secondary | ICD-10-CM

## 2021-02-27 MED ORDER — DICLOFENAC SODIUM 1 % EX GEL: 2.0000 g | Freq: Four times a day (QID) | CUTANEOUS | 2 refills | Status: DC

## 2021-02-27 MED ORDER — PROMETHAZINE HCL 12.5 MG PO TABS: 12.5000 mg | ORAL_TABLET | Freq: Four times a day (QID) | ORAL | 0 refills | Status: DC | PRN

## 2021-02-27 MED ORDER — LEVOFLOXACIN 500 MG PO TABS: 500.0000 mg | ORAL_TABLET | Freq: Every day | ORAL | 0 refills | Status: DC

## 2021-02-27 MED ORDER — MIRTAZAPINE 7.5 MG PO TABS: 7.5000 mg | ORAL_TABLET | Freq: Every day | ORAL | 1 refills | Status: DC

## 2021-02-27 NOTE — Patient Instructions (Addendum)
  You can add up to 2000 mg of acetominophen (tylenol) every day safely  In divided doses (500 mg every 6 hours  Or 1000 mg every 12 hours.)   Continue topical voltaren gel 4 times daily    400 mg advil every 6 hours or 600 mg every 12 hours    PT referral in process  to [facility]

## 2021-02-27 NOTE — Progress Notes (Signed)
Subjective:  Patient ID: Veronica Ferrell, female    DOB: 07-Apr-1951  Age: 70 y.o. MRN: [id number]  CC: The primary encounter diagnosis was Neck pain, bilateral posterior. Diagnoses of Degeneration of cervical intervertebral disc and Headache, occipital were also pertinent to this visit.  HPI Veronica Ferrell presents for evaluation of recurrent headaches  This visit occurred during the SARS-CoV-2 public health emergency.  Safety protocols were in place, including screening questions prior to the visit, additional usage of staff PPE, and extensive cleaning of exam room while observing appropriate contact time as indicated for disinfecting solutions.   70 yr old female with history of concussion in December after striking occiput on cement floor.  Occipital headaches after falling have decreased in severity.  head CT done to rule out SDH.  Has constant mild pain  In the occipital area and in the neck associated with sudden movement of head.  Denies radicular symptoms and arm weakness  Outpatient Medications Prior to Visit  Medication Sig Dispense Refill  . ALPRAZolam (XANAX) 1 MG tablet Take 1 tablet (1 mg total) by mouth at bedtime as needed for anxiety. 30 tablet 5  . aspirin 81 MG tablet Take 81 mg by mouth once a week.     . calcium citrate (CALCITRATE - DOSED IN MG ELEMENTAL CALCIUM) 950 MG tablet Take 1 tablet by mouth daily.    . Cholecalciferol (VITAMIN D3) 1000 UNITS CAPS Take 1 capsule by mouth daily.    . diclofenac sodium (VOLTAREN) 1 % GEL Apply 2 g topically 4 (four) times daily. 150 g 1  . estradiol (ESTRACE) 1 MG tablet TAKE ONE TABLET BY MOUTH EVERY DAY 90 tablet 3  . fludrocortisone (FLORINEF) 0.1 MG tablet TAKE 1 TABLET BY MOUTH DAILY. 90 tablet 1  . hydrocortisone (CORTEF) 20 MG tablet TAKE ONE TABLET EACH MORNING AND ONE-HALF TABLET EVERY NIGHT AS DIRECTED 135 tablet 0  . ipratropium (ATROVENT) 0.06 % nasal spray USE TWO SPRAYS INTO EACH NOSTRIL FOUR TIMES A DAY 15 mL 2  .  Lactulose 20 GM/30ML SOLN 30 ml every 4 hours until constipation is relieved 236 mL 3  . Multiple Vitamins-Minerals (MULTIVITAMIN WITH MINERALS) tablet Take 1 tablet by mouth daily.    Marland Kitchen omeprazole (PRILOSEC) 40 MG capsule TAKE 1 CAPSULE BY MOUTH EVERY DAY 90 capsule 1  . polyethylene glycol powder (GLYCOLAX/MIRALAX) powder USE AS DIRECTED 527 g 1  . triamcinolone cream (KENALOG) 0.1 % Apply 1 application topically 2 (two) times daily. 453.6 g 0  . vitamin C (ASCORBIC ACID) 500 MG tablet Take 500 mg by mouth daily.    . mirtazapine (REMERON) 7.5 MG tablet TAKE 1/2-1 TABLET BY MOUTH 30 MINUTES BEFORE BEDTIME 60 tablet 0  . promethazine (PHENERGAN) 12.5 MG tablet Take 1 tablet (12.5 mg total) by mouth every 6 (six) hours as needed for nausea or vomiting. 30 tablet 0   No facility-administered medications prior to visit.    Review of Systems;  Patient denies headache, fevers, malaise, unintentional weight loss, skin rash, eye pain, sinus congestion and sinus pain, sore throat, dysphagia,  hemoptysis , cough, dyspnea, wheezing, chest pain, palpitations, orthopnea, edema, abdominal pain, nausea, melena, diarrhea, constipation, flank pain, dysuria, hematuria, urinary  Frequency, nocturia, numbness, tingling, seizures,  Focal weakness, Loss of consciousness,  Tremor, insomnia, depression, anxiety, and suicidal ideation.      Objective:  BP (!) 88/60 (BP Location: Left Arm, Patient Position: Sitting, Cuff Size: Normal)   Pulse 82   Temp  98.1 F (36.7 C) (Oral)   Resp 14   Ht 5\' 4"  (1.626 m)   Wt 121 lb 9.6 oz (55.2 kg)   SpO2 99%   BMI 20.87 kg/m   BP Readings from Last 3 Encounters:  02/27/21 (!) 88/60  07/10/20 97/66  06/13/20 (!) 88/56    Wt Readings from Last 3 Encounters:  02/27/21 121 lb 9.6 oz (55.2 kg)  12/26/20 123 lb (55.8 kg)  07/10/20 123 lb (55.8 kg)    General appearance: alert, cooperative and appears stated age Ears: normal TM's and external ear canals both  ears Throat: lips, mucosa, and tongue normal; teeth and gums normal Neck: no adenopathy, no carotid bruit, supple, symmetrical, trachea midline and thyroid not enlarged, symmetric, no tenderness/mass/nodules Back: symmetric, no curvature. ROM normal. No CVA tenderness. Lungs: clear to auscultation bilaterally Heart: regular rate and rhythm, S1, S2 normal, no murmur, click, rub or gallop Abdomen: soft, non-tender; bowel sounds normal; no masses,  no organomegaly Pulses: 2+ and symmetric Skin: Skin color, texture, turgor normal. No rashes or lesions Lymph nodes: Cervical, supraclavicular, and axillary nodes normal.  Lab Results  Component Value Date   HGBA1C 6.0 05/16/2020   HGBA1C 6.0 06/27/2019    Lab Results  Component Value Date   CREATININE 0.83 06/13/2020   CREATININE 0.87 05/16/2020   CREATININE 0.85 06/27/2019    Lab Results  Component Value Date   WBC 7.5 06/13/2020   HGB 13.6 06/13/2020   HCT 39.7 06/13/2020   PLT 340.0 06/13/2020   GLUCOSE 94 06/13/2020   CHOL 235 (H) 05/16/2020   TRIG 94.0 05/16/2020   HDL 105.70 05/16/2020   LDLCALC 110 (H) 05/16/2020   ALT 13 06/13/2020   AST 16 06/13/2020   NA 132 (L) 06/13/2020   K 4.9 06/13/2020   CL 96 06/13/2020   CREATININE 0.83 06/13/2020   BUN 21 06/13/2020   CO2 28 06/13/2020   TSH 1.50 06/13/2020   HGBA1C 6.0 05/16/2020    MM 3D SCREEN BREAST BILATERAL  Result Date: 02/19/2021 CLINICAL DATA:  Screening. EXAM: DIGITAL SCREENING BILATERAL MAMMOGRAM WITH TOMOSYNTHESIS AND CAD TECHNIQUE: Bilateral screening digital craniocaudal and mediolateral oblique mammograms were obtained. Bilateral screening digital breast tomosynthesis was performed. The images were evaluated with computer-aided detection. COMPARISON:  Previous exam(s). ACR Breast Density Category c: The breast tissue is heterogeneously dense, which may obscure small masses. FINDINGS: There are no findings suspicious for malignancy. The images were evaluated  with computer-aided detection. IMPRESSION: No mammographic evidence of malignancy. A result letter of this screening mammogram will be mailed directly to the patient. RECOMMENDATION: Screening mammogram in one year. (Code:SM-B-01Y) BI-RADS CATEGORY  1: Negative. Electronically Signed   By: Marin Olp M.D.   On: 02/19/2021 14:16    Assessment & Plan:   Problem List Items Addressed This Visit      Unprioritized   Headache, occipital    Secondary to cervical disk changes.  She defers MRI for now. She has requested referral to PT Carney Harder.  Referral made, outlined care of neck      Relevant Medications   mirtazapine (REMERON) 7.5 MG tablet    Other Visit Diagnoses    Neck pain, bilateral posterior    -  Primary   Relevant Orders   Ambulatory referral to Physical Therapy   Degeneration of cervical intervertebral disc       Relevant Orders   Ambulatory referral to Physical Therapy      I have changed Chauncey Cruel.  Calvert "Jenine Zeta"'s mirtazapine. I am also having her start on diclofenac Sodium and levofloxacin. Additionally, I am having her maintain her aspirin, Vitamin D3, calcium citrate, multivitamin with minerals, vitamin C, polyethylene glycol powder, Lactulose, triamcinolone, diclofenac sodium, estradiol, ALPRAZolam, ipratropium, omeprazole, fludrocortisone, hydrocortisone, and promethazine.  Meds ordered this encounter  Medications  . diclofenac Sodium (VOLTAREN) 1 % GEL    Sig: Apply 2 g topically 4 (four) times daily.    Dispense:  100 g    Refill:  2  . mirtazapine (REMERON) 7.5 MG tablet    Sig: Take 1 tablet (7.5 mg total) by mouth at bedtime.    Dispense:  90 tablet    Refill:  1  . levofloxacin (LEVAQUIN) 500 MG tablet    Sig: Take 1 tablet (500 mg total) by mouth daily.    Dispense:  7 tablet    Refill:  0  . promethazine (PHENERGAN) 12.5 MG tablet    Sig: Take 1 tablet (12.5 mg total) by mouth every 6 (six) hours as needed for nausea or vomiting.    Dispense:   30 tablet    Refill:  0    Medications Discontinued During This Encounter  Medication Reason  . mirtazapine (REMERON) 7.5 MG tablet   . promethazine (PHENERGAN) 12.5 MG tablet     Follow-up: No follow-ups on file.   Crecencio Mc, MD

## 2021-02-28 DIAGNOSIS — R519 Headache, unspecified: Secondary | ICD-10-CM | POA: Insufficient documentation

## 2021-02-28 NOTE — Assessment & Plan Note (Signed)
Secondary to cervical disk changes.  She defers MRI for now. She has requested referral to PT Carney Harder.  Referral made, outlined care of neck

## 2021-03-02 ENCOUNTER — Other Ambulatory Visit: Payer: Self-pay | Admitting: Internal Medicine

## 2021-03-02 DIAGNOSIS — E782 Mixed hyperlipidemia: Secondary | ICD-10-CM

## 2021-03-02 DIAGNOSIS — E271 Primary adrenocortical insufficiency: Secondary | ICD-10-CM

## 2021-03-02 DIAGNOSIS — E559 Vitamin D deficiency, unspecified: Secondary | ICD-10-CM

## 2021-03-02 MED ORDER — VITAMIN D3 25 MCG (1000 UNIT) PO TABS: 3000.0000 [IU] | ORAL_TABLET | Freq: Every day | ORAL | 1 refills | Status: AC

## 2021-03-05 DIAGNOSIS — M542 Cervicalgia: Secondary | ICD-10-CM | POA: Diagnosis not present

## 2021-05-23 ENCOUNTER — Other Ambulatory Visit: Payer: Self-pay | Admitting: Internal Medicine

## 2021-05-29 DIAGNOSIS — M25561 Pain in right knee: Secondary | ICD-10-CM | POA: Diagnosis not present

## 2021-06-15 DIAGNOSIS — Z20822 Contact with and (suspected) exposure to covid-19: Secondary | ICD-10-CM | POA: Diagnosis not present

## 2021-06-18 ENCOUNTER — Encounter: Payer: Medicare PPO | Admitting: Internal Medicine

## 2021-06-18 ENCOUNTER — Other Ambulatory Visit: Payer: Self-pay | Admitting: Internal Medicine

## 2021-06-18 DIAGNOSIS — U071 COVID-19: Secondary | ICD-10-CM | POA: Insufficient documentation

## 2021-06-18 MED ORDER — MOLNUPIRAVIR EUA 200MG CAPSULE: 4.0000 | ORAL_CAPSULE | Freq: Two times a day (BID) | ORAL | 0 refills | Status: AC

## 2021-06-18 MED ORDER — AZITHROMYCIN 250 MG PO TABS: ORAL_TABLET | ORAL | 0 refills | Status: AC

## 2021-06-18 MED ORDER — HYDROCOD POLST-CPM POLST ER 10-8 MG/5ML PO SUER: 5.0000 mL | Freq: Every evening | ORAL | 0 refills | Status: DC | PRN

## 2021-06-19 ENCOUNTER — Other Ambulatory Visit: Payer: Self-pay | Admitting: Internal Medicine

## 2021-06-25 DIAGNOSIS — L821 Other seborrheic keratosis: Secondary | ICD-10-CM | POA: Diagnosis not present

## 2021-06-25 DIAGNOSIS — Z85828 Personal history of other malignant neoplasm of skin: Secondary | ICD-10-CM | POA: Diagnosis not present

## 2021-06-25 DIAGNOSIS — L720 Epidermal cyst: Secondary | ICD-10-CM | POA: Diagnosis not present

## 2021-07-11 ENCOUNTER — Ambulatory Visit (INDEPENDENT_AMBULATORY_CARE_PROVIDER_SITE_OTHER): Payer: Medicare PPO

## 2021-07-11 VITALS — Ht 64.0 in | Wt 121.0 lb

## 2021-07-11 DIAGNOSIS — Z0000 Encounter for general adult medical examination without abnormal findings: Secondary | ICD-10-CM

## 2021-07-11 NOTE — Progress Notes (Addendum)
Subjective:   Veronica Ferrell is a 70 y.o. female who presents for Medicare Annual (Subsequent) preventive examination.  Review of Systems    No ROS.  Medicare Wellness Virtual Visit.  Visual/audio telehealth visit, UTA vital signs.   See social history for additional risk factors.   Cardiac Risk Factors include: advanced age (>61men, >29 women)     Objective:    Today's Vitals   07/11/21 0952  Weight: 121 lb (54.9 kg)  Height: 5\' 4"  (1.626 m)   Body mass index is 20.77 kg/m.  Advanced Directives 07/11/2021 07/10/2020 07/14/2019 06/20/2019 07/30/2018 06/17/2018 04/10/2017  Does Patient Have a Medical Advance Directive? Yes Yes Yes Yes No Yes Yes  Type of Paramedic of [place];Living will [place];Living will Living will [place];Living will - [place];Living will [place]  Does patient want to make changes to medical advance directive? No - Patient declined No - Patient declined - No - Patient declined - No - Patient declined No - Patient declined  Copy of [place] in Chart? No - copy requested No - copy requested - No - copy requested - No - copy requested No - copy requested    Current Medications (verified) Outpatient Encounter Medications as of 07/11/2021  Medication Sig   ALPRAZolam (XANAX) 1 MG tablet Take 1 tablet (1 mg total) by mouth at bedtime as needed for anxiety.   aspirin 81 MG tablet Take 81 mg by mouth once a week.    calcium citrate (CALCITRATE - DOSED IN MG ELEMENTAL CALCIUM) 950 MG tablet Take 1 tablet by mouth daily.   chlorpheniramine-HYDROcodone (TUSSIONEX PENNKINETIC ER) 10-8 MG/5ML SUER Take 5 mLs by mouth at bedtime as needed for cough.   cholecalciferol (VITAMIN D) 25 MCG (1000 UNIT) tablet Take 3 tablets (3,000 Units total) by mouth daily.   diclofenac sodium (VOLTAREN) 1 % GEL Apply 2 g topically 4 (four) times daily.   diclofenac  Sodium (VOLTAREN) 1 % GEL Apply 2 g topically 4 (four) times daily.   estradiol (ESTRACE) 1 MG tablet TAKE ONE TABLET BY MOUTH EVERY DAY   fludrocortisone (FLORINEF) 0.1 MG tablet TAKE 1 TABLET BY MOUTH DAILY.   hydrocortisone (CORTEF) 20 MG tablet TAKE ONE TABLET EACH MORNING AND ONE-HALF TABLET EVERY NIGHT AS DIRECTED   ipratropium (ATROVENT) 0.06 % nasal spray USE TWO SPRAYS INTO EACH NOSTRIL FOUR TIMES A DAY   Lactulose 20 GM/30ML SOLN 30 ml every 4 hours until constipation is relieved   levofloxacin (LEVAQUIN) 500 MG tablet Take 1 tablet (500 mg total) by mouth daily.   mirtazapine (REMERON) 7.5 MG tablet Take 1 tablet (7.5 mg total) by mouth at bedtime.   Multiple Vitamins-Minerals (MULTIVITAMIN WITH MINERALS) tablet Take 1 tablet by mouth daily.   omeprazole (PRILOSEC) 40 MG capsule TAKE 1 CAPSULE BY MOUTH EVERY DAY   polyethylene glycol powder (GLYCOLAX/MIRALAX) powder USE AS DIRECTED   promethazine (PHENERGAN) 12.5 MG tablet Take 1 tablet (12.5 mg total) by mouth every 6 (six) hours as needed for nausea or vomiting.   triamcinolone cream (KENALOG) 0.1 % Apply 1 application topically 2 (two) times daily.   vitamin C (ASCORBIC ACID) 500 MG tablet Take 500 mg by mouth daily.   No facility-administered encounter medications on file as of 07/11/2021.    Allergies (verified) Alendronate sodium, Boniva [ibandronic acid], Clindamycin/lincomycin, Tramadol, and Penicillins   History: Past Medical History:  Diagnosis Date  Addison's disease ([place])    Arthritis    fingers   Cancer ([place])    skin   Chicken pox    Colon polyps    Diverticulitis    GERD (gastroesophageal reflux disease)    Past Surgical History:  Procedure Laterality Date   ABDOMINAL HYSTERECTOMY     APPENDECTOMY     BREAST BIOPSY Right 2010   neg   BREAST SURGERY     COLONOSCOPY WITH PROPOFOL N/A 07/14/2019   Procedure: COLONOSCOPY WITH PROPOFOL;  Surgeon: Lucilla Lame, MD;  Location: [facility] ENDOSCOPY;  Service:  Endoscopy;  Laterality: N/A;   ESOPHAGOGASTRODUODENOSCOPY (EGD) WITH PROPOFOL N/A 04/10/2017   Procedure: ESOPHAGOGASTRODUODENOSCOPY (EGD) WITH PROPOFOL;  Surgeon: Lucilla Lame, MD;  Location: [facility];  Service: Endoscopy;  Laterality: N/A;  requeests early as possible   FOOT SURGERY Bilateral    varicose veins     Family History  Problem Relation Age of Onset   Cancer Mother 66       Breast Ca  colon Ca (58)  and brain Ca (48)   Breast cancer Mother 77   Cancer Father    Cancer Maternal Aunt        breast   Breast cancer Maternal Aunt    Cancer Maternal Grandmother        breast   Breast cancer Maternal Grandmother    Cancer Maternal Aunt        breast ca   Breast cancer Maternal Aunt    Social History   Socioeconomic History   Marital status: Married    Spouse name: Not on file   Number of children: Not on file   Years of education: Not on file   Highest education level: Not on file  Occupational History   Not on file  Tobacco Use   Smoking status: Never   Smokeless tobacco: Never  Vaping Use   Vaping Use: Never used  Substance and Sexual Activity   Alcohol use: Yes    Alcohol/week: 7.0 standard drinks    Types: 7 Glasses of wine per week    Comment: WINE EACH WEEK   Drug use: No   Sexual activity: Yes  Other Topics Concern   Not on file  Social History Narrative   Not on file   Social Determinants of Health   Financial Resource Strain: Low Risk    Difficulty of Paying Living Expenses: Not hard at all  Food Insecurity: No Food Insecurity   Worried About Charity fundraiser in the Last Year: Never true   [place] in the Last Year: Never true  Transportation Needs: No Transportation Needs   Lack of Transportation (Medical): No   Lack of Transportation (Non-Medical): No  Physical Activity: Sufficiently Active   Days of Exercise per Week: 5 days   Minutes of Exercise per Session: 60 min  Stress: No Stress Concern Present   Feeling of  Stress : Not at all  Social Connections: Unknown   Frequency of Communication with Friends and Family: Not on file   Frequency of Social Gatherings with Friends and Family: Not on file   Attends Religious Services: Not on file   Active Member of Clubs or Organizations: Not on file   Attends Archivist Meetings: Not on file   Marital Status: Married    Tobacco Counseling Counseling given: Not Answered   Clinical Intake:  Pre-visit preparation completed: Yes        Diabetes: No  How often do you need to have someone help you when you read instructions, pamphlets, or other written materials from your doctor or pharmacy?: 1 - Never    Interpreter Needed?: No      Activities of Daily Living In your present state of health, do you have any difficulty performing the following activities: 07/11/2021  Hearing? N  Vision? N  Difficulty concentrating or making decisions? N  Walking or climbing stairs? N  Dressing or bathing? N  Doing errands, shopping? N  Preparing Food and eating ? N  Using the Toilet? N  In the past six months, have you accidently leaked urine? N  Do you have problems with loss of bowel control? N  Managing your Medications? N  Managing your Finances? N  Housekeeping or managing your Housekeeping? N  Some recent data might be hidden    Patient Care Team: Crecencio Mc, MD as PCP - General (Internal Medicine)  Indicate any recent Medical Services you may have received from other than [facility] providers in the past year (date may be approximate).     Assessment:   This is a routine wellness examination for [facility].  I connected with [place] today by telephone and verified that I am speaking with the correct person using two identifiers. Location patient: home Location provider: work Persons participating in the virtual visit: patient, Marine scientist.    I discussed the limitations, risks, security and privacy concerns of performing an evaluation and  management service by telephone and the availability of in person appointments. I also discussed with the patient that there may be a patient responsible charge related to this service. The patient expressed understanding and verbally consented to this telephonic visit.    Interactive audio and video telecommunications were attempted between this provider and patient, however failed, due to patient having technical difficulties OR patient did not have access to video capability.  We continued and completed visit with audio only.  Some vital signs may be absent or patient reported.   Hearing/Vision screen Hearing Screening - Comments:: Patient is able to hear conversational tones without difficulty. No issues reported.  Vision Screening - Comments:: Followed by Dr. Mali Brasington  Wears corrective lenses  They have regular follow up with the ophthalmologist  Dietary issues and exercise activities discussed: Current Exercise Habits: Home exercise routine, Type of exercise: walking, Intensity: Moderate Healthy diet Good water intake   Goals Addressed               This Visit's Progress     Patient Stated     Maintain Healthy Lifestyle (pt-stated)        Healthy diet Stay active Follow up with pcp as needed       Depression Screen PHQ 2/9 Scores 07/11/2021 07/10/2020 06/20/2019 06/17/2018 03/25/2017 01/30/2016 09/11/2014  PHQ - 2 Score 0 0 0 0 0 0 0    Fall Risk Fall Risk  07/11/2021 02/27/2021 12/26/2020 07/10/2020 06/13/2020  Falls in the past year? 0 0 1 0 0  Number falls in past yr: 0 - 0 0 -  Injury with Fall? 0 - 1 - -  Follow up Falls evaluation completed Falls evaluation completed Falls evaluation completed Falls evaluation completed Falls evaluation completed    [place]: Adequate lighting in your home to reduce risk of falls? Yes   ASSISTIVE DEVICES UTILIZED TO PREVENT FALLS: Life alert? No  Use of a cane, walker or w/c? No   TIMED UP  AND GO: Was the test performed? No .   Cognitive Function: MMSE - Mini Mental State Exam 07/10/2020 06/17/2018  Not completed: Unable to complete -  Orientation to time - 5  Orientation to Place - 5  Registration - 3  Attention/ Calculation - 5  Recall - 3  Language- name 2 objects - 2  Language- repeat - 1  Language- follow 3 step command - 3  Language- read & follow direction - 1  Write a sentence - 1  Copy design - 1  Total score - 30     6CIT Screen 06/20/2019  What Year? 0 points  What month? 0 points  What time? 0 points  Count back from 20 0 points  Months in reverse 0 points  Repeat phrase 0 points  Total Score 0    Immunizations Immunization History  Administered Date(s) Administered   Fluad Quad(high Dose 65+) 09/16/2019   Influenza Split 09/21/2013, 09/19/2015, 08/30/2017   Influenza, High Dose Seasonal PF 10/08/2020   Influenza,inj,Quad PF,6+ Mos 09/11/2014   Influenza-Unspecified 08/22/2018   PFIZER(Purple Top)SARS-COV-2 Vaccination 01/13/2020, 02/03/2020, 09/23/2020   Pneumococcal Conjugate-13 09/11/2014   Pneumococcal Polysaccharide-23 03/08/2012, 03/30/2018   Tdap 03/09/2011, 03/08/2012   Zoster Recombinat (Shingrix) 08/24/2018, 11/19/2018   Zoster, Live 03/08/2012    Labs scheduled per patient request, prior to cpe. Orders previously placed.   Health Maintenance Health Maintenance  Topic Date Due   COVID-19 Vaccine (4 - Booster for Pfizer series) 07/27/2021 (Originally 12/24/2020)   INFLUENZA VACCINE  07/22/2021   MAMMOGRAM  02/18/2022   TETANUS/TDAP  03/08/2022   COLONOSCOPY (Pts 45-79yrs Insurance coverage will need to be confirmed)  07/13/2024   DEXA SCAN  Completed   Hepatitis C Screening  Completed   PNA vac Low Risk Adult  Completed   Zoster Vaccines- Shingrix  Completed   HPV VACCINES  Aged Out   Lung Cancer Screening: (Low Dose CT Chest recommended if Age 55-80 years, 30 pack-year currently smoking OR have quit w/in 15years.) does not  qualify.   Vision Screening: Recommended annual ophthalmology exams for early detection of glaucoma and other disorders of the eye. Is the patient up to date with their annual eye exam?  Yes   Dental Screening: Recommended annual dental exams for proper oral hygiene  Community Resource Referral / Chronic Care Management: CRR required this visit?  No   CCM required this visit?  No      Plan:   Keep all routine maintenance appointments.   I have personally reviewed and noted the following in the patient's chart:   Medical and social history Use of alcohol, tobacco or illicit drugs  Current medications and supplements including opioid prescriptions. Patient is not currently taking opioid.  Functional ability and status Nutritional status Physical activity Advanced directives List of other physicians Hospitalizations, surgeries, and ER visits in previous 12 months Vitals Screenings to include cognitive, depression, and falls Referrals and appointments  In addition, I have reviewed and discussed with patient certain preventive protocols, quality metrics, and best practice recommendations. A written personalized care plan for preventive services as well as general preventive health recommendations were provided to patient via mychart.     OBrien-Blaney, Marea Reasner L, LPN   06/12/2978     I have reviewed the above information and agree with above.   Deborra Medina, MD

## 2021-07-11 NOTE — Patient Instructions (Addendum)
[name] , Thank you for taking time to come for your Medicare Wellness Visit. I appreciate your ongoing commitment to your health goals. Please review the following plan we discussed and let me know if I can assist you in the future.   These are the goals we discussed:  Goals       Patient Stated     Maintain Healthy Lifestyle (pt-stated)      Healthy diet Stay active Follow up with pcp as needed        This is a list of the screening recommended for you and due dates:  Health Maintenance  Topic Date Due   COVID-19 Vaccine (4 - Booster for Pfizer series) 07/27/2021*   Flu Shot  07/22/2021   Mammogram  02/18/2022   Tetanus Vaccine  03/08/2022   Colon Cancer Screening  07/13/2024   DEXA scan (bone density measurement)  Completed   Hepatitis C Screening: USPSTF Recommendation to screen - Ages 15-79 yo.  Completed   Pneumonia vaccines  Completed   Zoster (Shingles) Vaccine  Completed   HPV Vaccine  Aged Out  *Topic was postponed. The date shown is not the original due date.    Advanced directives: End of life planning; Advance aging; Advanced directives discussed.  Copy of current HCPOA/Living Will requested.    Conditions/risks identified: none new  Follow up in one year for your annual wellness visit    Preventive Care 65 Years and Older, Female Preventive care refers to lifestyle choices and visits with your health care provider that can promote health and wellness. What does preventive care include? A yearly physical exam. This is also called an annual well check. Dental exams once or twice a year. Routine eye exams. Ask your health care provider how often you should have your eyes checked. Personal lifestyle choices, including: Daily care of your teeth and gums. Regular physical activity. Eating a healthy diet. Avoiding tobacco and drug use. Limiting alcohol use. Practicing safe sex. Taking low-dose aspirin every day. Taking vitamin and mineral supplements  as recommended by your health care provider. What happens during an annual well check? The services and screenings done by your health care provider during your annual well check will depend on your age, overall health, lifestyle risk factors, and family history of disease. Counseling  Your health care provider may ask you questions about your: Alcohol use. Tobacco use. Drug use. Emotional well-being. Home and relationship well-being. Sexual activity. Eating habits. History of falls. Memory and ability to understand (cognition). Work and work Statistician. Reproductive health. Screening  You may have the following tests or measurements: Height, weight, and BMI. Blood pressure. Lipid and cholesterol levels. These may be checked every 5 years, or more frequently if you are over 26 years old. Skin check. Lung cancer screening. You may have this screening every year starting at age 49 if you have a 30-pack-year history of smoking and currently smoke or have quit within the past 15 years. Fecal occult blood test (FOBT) of the stool. You may have this test every year starting at age 19. Flexible sigmoidoscopy or colonoscopy. You may have a sigmoidoscopy every 5 years or a colonoscopy every 10 years starting at age 33. Hepatitis C blood test. Hepatitis B blood test. Sexually transmitted disease (STD) testing. Diabetes screening. This is done by checking your blood sugar (glucose) after you have not eaten for a while (fasting). You may have this done every 1-3 years. Bone density scan. This is done to screen  for osteoporosis. You may have this done starting at age 36. Mammogram. This may be done every 1-2 years. Talk to your health care provider about how often you should have regular mammograms. Talk with your health care provider about your test results, treatment options, and if necessary, the need for more tests. Vaccines  Your health care provider may recommend certain vaccines, such  as: Influenza vaccine. This is recommended every year. Tetanus, diphtheria, and acellular pertussis (Tdap, Td) vaccine. You may need a Td booster every 10 years. Zoster vaccine. You may need this after age 63. Pneumococcal 13-valent conjugate (PCV13) vaccine. One dose is recommended after age 30. Pneumococcal polysaccharide (PPSV23) vaccine. One dose is recommended after age 36. Talk to your health care provider about which screenings and vaccines you need and how often you need them. This information is not intended to replace advice given to you by your health care provider. Make sure you discuss any questions you have with your health care provider. Document Released: 01/04/2016 Document Revised: 08/27/2016 Document Reviewed: 10/09/2015 Elsevier Interactive Patient Education  2017 [place] Prevention in the Home Falls can cause injuries. They can happen to people of all ages. There are many things you can do to make your home safe and to help prevent falls. What can I do on the outside of my home? Regularly fix the edges of walkways and driveways and fix any cracks. Remove anything that might make you trip as you walk through a door, such as a raised step or threshold. Trim any bushes or trees on the path to your home. Use bright outdoor lighting. Clear any walking paths of anything that might make someone trip, such as rocks or tools. Regularly check to see if handrails are loose or broken. Make sure that both sides of any steps have handrails. Any raised decks and porches should have guardrails on the edges. Have any leaves, snow, or ice cleared regularly. Use sand or salt on walking paths during winter. Clean up any spills in your garage right away. This includes oil or grease spills. What can I do in the bathroom? Use night lights. Install grab bars by the toilet and in the tub and shower. Do not use towel bars as grab bars. Use non-skid mats or decals in the tub or  shower. If you need to sit down in the shower, use a plastic, non-slip stool. Keep the floor dry. Clean up any water that spills on the floor as soon as it happens. Remove soap buildup in the tub or shower regularly. Attach bath mats securely with double-sided non-slip rug tape. Do not have throw rugs and other things on the floor that can make you trip. What can I do in the bedroom? Use night lights. Make sure that you have a light by your bed that is easy to reach. Do not use any sheets or blankets that are too big for your bed. They should not hang down onto the floor. Have a firm chair that has side arms. You can use this for support while you get dressed. Do not have throw rugs and other things on the floor that can make you trip. What can I do in the kitchen? Clean up any spills right away. Avoid walking on wet floors. Keep items that you use a lot in easy-to-reach places. If you need to reach something above you, use a strong step stool that has a grab bar. Keep electrical cords out of the way. Do  not use floor polish or wax that makes floors slippery. If you must use wax, use non-skid floor wax. Do not have throw rugs and other things on the floor that can make you trip. What can I do with my stairs? Do not leave any items on the stairs. Make sure that there are handrails on both sides of the stairs and use them. Fix handrails that are broken or loose. Make sure that handrails are as long as the stairways. Check any carpeting to make sure that it is firmly attached to the stairs. Fix any carpet that is loose or worn. Avoid having throw rugs at the top or bottom of the stairs. If you do have throw rugs, attach them to the floor with carpet tape. Make sure that you have a light switch at the top of the stairs and the bottom of the stairs. If you do not have them, ask someone to add them for you. What else can I do to help prevent falls? Wear shoes that: Do not have high heels. Have  rubber bottoms. Are comfortable and fit you well. Are closed at the toe. Do not wear sandals. If you use a stepladder: Make sure that it is fully opened. Do not climb a closed stepladder. Make sure that both sides of the stepladder are locked into place. Ask someone to hold it for you, if possible. Clearly mark and make sure that you can see: Any grab bars or handrails. First and last steps. Where the edge of each step is. Use tools that help you move around (mobility aids) if they are needed. These include: Canes. Walkers. Scooters. Crutches. Turn on the lights when you go into a dark area. Replace any light bulbs as soon as they burn out. Set up your furniture so you have a clear path. Avoid moving your furniture around. If any of your floors are uneven, fix them. If there are any pets around you, be aware of where they are. Review your medicines with your doctor. Some medicines can make you feel dizzy. This can increase your chance of falling. Ask your doctor what other things that you can do to help prevent falls. This information is not intended to replace advice given to you by your health care provider. Make sure you discuss any questions you have with your health care provider. Document Released: 10/04/2009 Document Revised: 05/15/2016 Document Reviewed: 01/12/2015 Elsevier Interactive Patient Education  2017 [employer].

## 2021-07-16 ENCOUNTER — Other Ambulatory Visit (INDEPENDENT_AMBULATORY_CARE_PROVIDER_SITE_OTHER): Payer: Medicare PPO

## 2021-07-16 ENCOUNTER — Other Ambulatory Visit: Payer: Self-pay

## 2021-07-16 DIAGNOSIS — E782 Mixed hyperlipidemia: Secondary | ICD-10-CM

## 2021-07-16 DIAGNOSIS — E271 Primary adrenocortical insufficiency: Secondary | ICD-10-CM

## 2021-07-16 LAB — LIPID PANEL
Cholesterol: 230 mg/dL — ABNORMAL HIGH (ref 0–200)
HDL: 112.9 mg/dL (ref >39.00)
LDL Cholesterol: 99 mg/dL (ref 0–99)
NonHDL: 117.39
Total CHOL/HDL Ratio: 2
Triglycerides: 90 mg/dL (ref 0.0–149.0)
VLDL: 18 mg/dL (ref 0.0–40.0)

## 2021-07-16 LAB — COMPREHENSIVE METABOLIC PANEL
ALT: 14 U/L (ref 0–35)
AST: 15 U/L (ref 0–37)
Albumin: 4.4 g/dL (ref 3.5–5.2)
Alkaline Phosphatase: 29 U/L — ABNORMAL LOW (ref 39–117)
BUN: 23 mg/dL (ref 6–23)
CO2: 28 mEq/L (ref 19–32)
Calcium: 9.8 mg/dL (ref 8.4–10.5)
Chloride: 98 mEq/L (ref 96–112)
Creatinine, Ser: 0.83 mg/dL (ref 0.40–1.20)
GFR: 70.52 mL/min (ref >60.00)
Glucose, Bld: 86 mg/dL (ref 70–99)
Potassium: 5.1 mEq/L (ref 3.5–5.1)
Sodium: 134 mEq/L — ABNORMAL LOW (ref 135–145)
Total Bilirubin: 0.5 mg/dL (ref 0.2–1.2)
Total Protein: 7 g/dL (ref 6.0–8.3)

## 2021-07-16 LAB — CBC WITH DIFFERENTIAL/PLATELET
Basophils Absolute: 0 10*3/uL (ref 0.0–0.1)
Basophils Relative: 0.5 % (ref 0.0–3.0)
Eosinophils Absolute: 0.1 10*3/uL (ref 0.0–0.7)
Eosinophils Relative: 1.5 % (ref 0.0–5.0)
HCT: 41.7 % (ref 36.0–46.0)
Hemoglobin: 13.8 g/dL (ref 12.0–15.0)
Lymphocytes Relative: 31 % (ref 12.0–46.0)
Lymphs Abs: 2.9 10*3/uL (ref 0.7–4.0)
MCHC: 33 g/dL (ref 30.0–36.0)
MCV: 99.9 fl (ref 78.0–100.0)
Monocytes Absolute: 0.7 10*3/uL (ref 0.1–1.0)
Monocytes Relative: 7.6 % (ref 3.0–12.0)
Neutro Abs: 5.5 10*3/uL (ref 1.4–7.7)
Neutrophils Relative %: 59.4 % (ref 43.0–77.0)
Platelets: 369 10*3/uL (ref 150.0–400.0)
RBC: 4.17 Mil/uL (ref 3.87–5.11)
RDW: 13.1 % (ref 11.5–15.5)
WBC: 9.2 10*3/uL (ref 4.0–10.5)

## 2021-07-16 LAB — VITAMIN D 25 HYDROXY (VIT D DEFICIENCY, FRACTURES): VITD: 55.18 ng/mL (ref 30.00–100.00)

## 2021-07-16 LAB — TSH: TSH: 2.24 u[IU]/mL (ref 0.35–5.50)

## 2021-07-18 ENCOUNTER — Ambulatory Visit (INDEPENDENT_AMBULATORY_CARE_PROVIDER_SITE_OTHER): Payer: Medicare PPO | Admitting: Internal Medicine

## 2021-07-18 ENCOUNTER — Encounter: Payer: Self-pay | Admitting: Internal Medicine

## 2021-07-18 ENCOUNTER — Other Ambulatory Visit: Payer: Self-pay

## 2021-07-18 VITALS — BP 102/68 | HR 76 | Temp 96.4°F | Resp 14 | Ht 64.0 in | Wt 124.6 lb

## 2021-07-18 DIAGNOSIS — Z0000 Encounter for general adult medical examination without abnormal findings: Secondary | ICD-10-CM | POA: Diagnosis not present

## 2021-07-18 DIAGNOSIS — Z9229 Personal history of other drug therapy: Secondary | ICD-10-CM

## 2021-07-18 DIAGNOSIS — E271 Primary adrenocortical insufficiency: Secondary | ICD-10-CM

## 2021-07-18 NOTE — Progress Notes (Signed)
Patient ID: Veronica Ferrell, female    DOB: 05/07/1951  Age: 70 y.o. MRN: [id number]  The patient is here for annual preventive examination and management of other chronic and acute problems.   The risk factors are reflected in the social history.  The roster of all physicians providing medical care to patient - is listed in the Snapshot section of the chart.  Activities of daily living:  The patient is 100% independent in all ADLs: dressing, toileting, feeding as well as independent mobility  Home safety : The patient has smoke detectors in the home. They wear seatbelts.  There are no firearms at home. There is no violence in the home.   There is no risks for hepatitis, STDs or HIV. There is no   history of blood transfusion. They have no travel history to infectious disease endemic areas of the world.  The patient has seen their dentist in the last six month. They have seen their eye doctor in the last year. She denies hearing difficulty with regard to whispered voices and some television programs.  They have deferred audiologic testing in the last year.  They do not  have excessive sun exposure. Discussed the need for sun protection: hats, long sleeves and use of sunscreen if there is significant sun exposure.   Diet: the importance of a healthy diet is discussed. They do have a healthy diet.  The benefits of regular aerobic exercise were discussed. She plays pickle ball several time per week and walks the other days for 60 minutes.   Depression screen: there are no signs or vegative symptoms of depression- irritability, change in appetite, anhedonia, sadness/tearfullness.  Cognitive assessment: the patient manages all their financial and personal affairs and is actively engaged. They could relate day,date,year and events; recalled 2/3 objects at 3 minutes; performed clock-face test normally.  The following portions of the patient's history were reviewed and updated as appropriate:  allergies, current medications, past family history, past medical history,  past surgical history, past social history  and problem list.  Visual acuity was not assessed per patient preference since she has regular follow up with her ophthalmologist. Hearing and body mass index were assessed and reviewed.   During the course of the visit the patient was educated and counseled about appropriate screening and preventive services including : fall prevention , diabetes screening, nutrition counseling, colorectal cancer screening, and recommended immunizations.    CC: The primary encounter diagnosis was Addison's disease ([name]). Diagnoses of COVID-19 vaccine series completed and Encounter for preventive health examination were also pertinent to this visit.  She has fully recovered from [name].  Symptoms were mild.  She does not want to be vaccinated again.     History Veronica Ferrell has a past medical history of Addison's disease ([name]), Arthritis, Cancer ([name]), Chicken pox, Colon polyps, Diverticulitis, and GERD (gastroesophageal reflux disease).   She has a past surgical history that includes varicose veins; Breast surgery; Appendectomy; Abdominal hysterectomy; Foot surgery (Bilateral); Esophagogastroduodenoscopy (egd) with propofol (N/A, 04/10/2017); Colonoscopy with propofol (N/A, 07/14/2019); and Breast biopsy (Right, 2010).   Her family history includes Breast cancer in her maternal aunt, maternal aunt, and maternal grandmother; Breast cancer (age of onset: 49) in her mother; Cancer in her father, maternal aunt, maternal aunt, and maternal grandmother; Cancer (age of onset: 42) in her mother.She reports that she has never smoked. She has never used smokeless tobacco. She reports current alcohol use of about 7.0 standard drinks of alcohol per week. She reports  that she does not use drugs.  Outpatient Medications Prior to Visit  Medication Sig Dispense Refill   ALPRAZolam (XANAX) 1 MG tablet Take 1 tablet (1 mg  total) by mouth at bedtime as needed for anxiety. 30 tablet 5   aspirin 81 MG tablet Take 81 mg by mouth once a week.      calcium citrate (CALCITRATE - DOSED IN MG ELEMENTAL CALCIUM) 950 MG tablet Take 1 tablet by mouth daily.     cholecalciferol (VITAMIN D) 25 MCG (1000 UNIT) tablet Take 3 tablets (3,000 Units total) by mouth daily. 90 tablet 1   diclofenac Sodium (VOLTAREN) 1 % GEL Apply 2 g topically 4 (four) times daily. 100 g 2   estradiol (ESTRACE) 1 MG tablet TAKE ONE TABLET BY MOUTH EVERY DAY 90 tablet 3   fludrocortisone (FLORINEF) 0.1 MG tablet TAKE 1 TABLET BY MOUTH DAILY. 90 tablet 1   hydrocortisone (CORTEF) 20 MG tablet TAKE ONE TABLET EACH MORNING AND ONE-HALF TABLET EVERY NIGHT AS DIRECTED 135 tablet 0   ipratropium (ATROVENT) 0.06 % nasal spray USE TWO SPRAYS INTO EACH NOSTRIL FOUR TIMES A DAY 15 mL 2   Lactulose 20 GM/30ML SOLN 30 ml every 4 hours until constipation is relieved 236 mL 3   mirtazapine (REMERON) 7.5 MG tablet Take 1 tablet (7.5 mg total) by mouth at bedtime. 90 tablet 1   Multiple Vitamins-Minerals (MULTIVITAMIN WITH MINERALS) tablet Take 1 tablet by mouth daily.     omeprazole (PRILOSEC) 40 MG capsule TAKE 1 CAPSULE BY MOUTH EVERY DAY 90 capsule 1   polyethylene glycol powder (GLYCOLAX/MIRALAX) powder USE AS DIRECTED 527 g 1   promethazine (PHENERGAN) 12.5 MG tablet Take 1 tablet (12.5 mg total) by mouth every 6 (six) hours as needed for nausea or vomiting. 30 tablet 0   triamcinolone cream (KENALOG) 0.1 % Apply 1 application topically 2 (two) times daily. 453.6 g 0   vitamin C (ASCORBIC ACID) 500 MG tablet Take 500 mg by mouth daily.     chlorpheniramine-HYDROcodone (TUSSIONEX PENNKINETIC ER) 10-8 MG/5ML SUER Take 5 mLs by mouth at bedtime as needed for cough. (Patient not taking: Reported on 07/18/2021) 140 mL 0   diclofenac sodium (VOLTAREN) 1 % GEL Apply 2 g topically 4 (four) times daily. (Patient not taking: Reported on 07/18/2021) 150 g 1   levofloxacin  (LEVAQUIN) 500 MG tablet Take 1 tablet (500 mg total) by mouth daily. (Patient not taking: Reported on 07/18/2021) 7 tablet 0   No facility-administered medications prior to visit.    Review of Systems  Patient denies headache, fevers, malaise, unintentional weight loss, skin rash, eye pain, sinus congestion and sinus pain, sore throat, dysphagia,  hemoptysis , cough, dyspnea, wheezing, chest pain, palpitations, orthopnea, edema, abdominal pain, nausea, melena, diarrhea, constipation, flank pain, dysuria, hematuria, urinary  Frequency, nocturia, numbness, tingling, seizures,  Focal weakness, Loss of consciousness,  Tremor, insomnia, depression, anxiety, and suicidal ideation.     Objective:  BP 102/68 (BP Location: Left Arm, Patient Position: Sitting, Cuff Size: Normal)   Pulse 76   Temp (!) 96.4 F (35.8 C) (Temporal)   Resp 14   Ht '5\' 4"'$  (1.626 m)   Wt 124 lb 9.6 oz (56.5 kg)   SpO2 96%   BMI 21.39 kg/m   Physical Exam  General appearance: alert, cooperative and appears stated age Head: Normocephalic, without obvious abnormality, atraumatic Eyes: conjunctivae/corneas clear. PERRL, EOM's intact. Fundi benign. Ears: normal TM's and external ear canals both ears Nose: Nares  normal. Septum midline. Mucosa normal. No drainage or sinus tenderness. Throat: lips, mucosa, and tongue normal; teeth and gums normal Neck: no adenopathy, no carotid bruit, no JVD, supple, symmetrical, trachea midline and thyroid not enlarged, symmetric, no tenderness/mass/nodules Lungs: clear to auscultation bilaterally Breasts: normal appearance, no masses or tenderness Heart: regular rate and rhythm, S1, S2 normal, no murmur, click, rub or gallop Abdomen: soft, non-tender; bowel sounds normal; no masses,  no organomegaly Extremities: extremities normal, atraumatic, no cyanosis or edema Pulses: 2+ and symmetric Skin: Skin color, texture, turgor normal. No rashes or lesions Neurologic: Alert and oriented X 3,  normal strength and tone. Normal symmetric reflexes. Normal coordination and gait.     Assessment & Plan:   Problem List Items Addressed This Visit       Unprioritized   Addison's disease ([place]) - Primary   Relevant Orders   Comprehensive metabolic panel   Hemoglobin A1c   CBC with Differential/Platelet   Encounter for preventive health examination    age appropriate education and counseling updated, referrals for preventative services and immunizations addressed, dietary and smoking counseling addressed, most recent labs reviewed.  I have personally reviewed and have noted:   1) the patient's medical and social history 2) The pt's use of alcohol, tobacco, and illicit drugs 3) The patient's current medications and supplements 4) Functional ability including ADL's, fall risk, home safety risk, hearing and visual impairment 5) Diet and physical activities 6) Evidence for depression or mood disorder 7) The patient's height, weight, and BMI have been recorded in the chart   I have made referrals, and provided counseling and education based on review of the above       Other Visit Diagnoses     COVID-19 vaccine series completed       Relevant Orders   SARS-CoV-2 Semi-Quantitative Total Antibody, Spike       I have discontinued Doreene Nest "Markia Zeta"'s levofloxacin and chlorpheniramine-HYDROcodone. I am also having her maintain her aspirin, calcium citrate, multivitamin with minerals, vitamin C, polyethylene glycol powder, Lactulose, triamcinolone cream, ALPRAZolam, omeprazole, fludrocortisone, diclofenac Sodium, mirtazapine, promethazine, cholecalciferol, hydrocortisone, estradiol, and ipratropium.  No orders of the defined types were placed in this encounter.   Medications Discontinued During This Encounter  Medication Reason   chlorpheniramine-HYDROcodone (TUSSIONEX PENNKINETIC ER) 10-8 MG/5ML SUER    diclofenac sodium (VOLTAREN) 1 % GEL Duplicate   levofloxacin  (LEVAQUIN) 500 MG tablet     Follow-up: Return in about 6 months (around 01/18/2022).   Crecencio Mc, MD

## 2021-07-18 NOTE — Patient Instructions (Signed)
GOOD TO SEE YOU!  GLAD YOUR COVID INFECTION WAS MILD    We will check your COVID ANTIBODY level in 6 months with your other NON FASTING LABS

## 2021-07-20 NOTE — Assessment & Plan Note (Signed)

## 2021-07-25 ENCOUNTER — Other Ambulatory Visit: Payer: Self-pay | Admitting: Internal Medicine

## 2021-07-25 DIAGNOSIS — K219 Gastro-esophageal reflux disease without esophagitis: Secondary | ICD-10-CM

## 2021-08-02 ENCOUNTER — Encounter: Payer: Self-pay | Admitting: Internal Medicine

## 2021-08-02 ENCOUNTER — Other Ambulatory Visit: Payer: Self-pay | Admitting: Internal Medicine

## 2021-08-02 ENCOUNTER — Other Ambulatory Visit: Payer: Self-pay

## 2021-08-02 ENCOUNTER — Telehealth: Payer: Self-pay

## 2021-08-02 ENCOUNTER — Ambulatory Visit: Payer: Medicare PPO | Admitting: Internal Medicine

## 2021-08-02 VITALS — BP 108/76 | HR 80 | Temp 96.1°F | Resp 14 | Ht 64.0 in | Wt 124.0 lb

## 2021-08-02 DIAGNOSIS — N644 Mastodynia: Secondary | ICD-10-CM

## 2021-08-02 DIAGNOSIS — N6459 Other signs and symptoms in breast: Secondary | ICD-10-CM | POA: Insufficient documentation

## 2021-08-02 NOTE — Assessment & Plan Note (Signed)
Left breast tenderness in area with palpable cyst.  Diagnostic mammogram and [place] ordered

## 2021-08-02 NOTE — Telephone Encounter (Signed)
Order has been corrected.  

## 2021-08-02 NOTE — Telephone Encounter (Signed)
-----   Message from Ashley Jacobs sent at 08/02/2021  9:22 AM EDT ----- Regarding: Mammo order Good morning!  Needing a corrected order for IMG 5533 (Diag uni lateral left). Please and Thank you!

## 2021-08-02 NOTE — Progress Notes (Signed)
Subjective:  Patient ID: Veronica Ferrell, female    DOB: 1951/07/05  Age: 70 y.o. MRN: [id number]  CC: The encounter diagnosis was Breast tenderness in female.  HPI CHAVONNA COWIN presents for evaluation of left breast tenderness  This visit occurred during the SARS-CoV-2 public health emergency.  Safety protocols were in place, including screening questions prior to the visit, additional usage of staff PPE, and extensive cleaning of exam room while observing appropriate contact time as indicated for disinfecting solutions.    Last mammogram was 3d and normal Feb 2022   For the past several weeks feels a tenderness in LUO quadrant  that is not affected by position or motion of arm.  No recent fall,  medication change, or unusual activity.  Takes estradiol and fludrocortisone for adrenal insufficiency   Outpatient Medications Prior to Visit  Medication Sig Dispense Refill   ALPRAZolam (XANAX) 1 MG tablet Take 1 tablet (1 mg total) by mouth at bedtime as needed for anxiety. 30 tablet 5   aspirin 81 MG tablet Take 81 mg by mouth once a week.      calcium citrate (CALCITRATE - DOSED IN MG ELEMENTAL CALCIUM) 950 MG tablet Take 1 tablet by mouth daily.     cholecalciferol (VITAMIN D) 25 MCG (1000 UNIT) tablet Take 3 tablets (3,000 Units total) by mouth daily. 90 tablet 1   diclofenac Sodium (VOLTAREN) 1 % GEL Apply 2 g topically 4 (four) times daily. 100 g 2   estradiol (ESTRACE) 1 MG tablet TAKE ONE TABLET BY MOUTH EVERY DAY 90 tablet 3   fludrocortisone (FLORINEF) 0.1 MG tablet TAKE 1 TABLET BY MOUTH DAILY 90 tablet 1   hydrocortisone (CORTEF) 20 MG tablet TAKE ONE TABLET EACH MORNING AND ONE-HALF TABLET EVERY NIGHT AS DIRECTED 135 tablet 0   ipratropium (ATROVENT) 0.06 % nasal spray USE TWO SPRAYS INTO EACH NOSTRIL FOUR TIMES A DAY 15 mL 2   Lactulose 20 GM/30ML SOLN 30 ml every 4 hours until constipation is relieved 236 mL 3   mirtazapine (REMERON) 7.5 MG tablet Take 1 tablet (7.5 mg  total) by mouth at bedtime. 90 tablet 1   Multiple Vitamins-Minerals (MULTIVITAMIN WITH MINERALS) tablet Take 1 tablet by mouth daily.     omeprazole (PRILOSEC) 40 MG capsule TAKE 1 CAPSULE BY MOUTH EVERY DAY 90 capsule 1   polyethylene glycol powder (GLYCOLAX/MIRALAX) powder USE AS DIRECTED 527 g 1   promethazine (PHENERGAN) 12.5 MG tablet Take 1 tablet (12.5 mg total) by mouth every 6 (six) hours as needed for nausea or vomiting. 30 tablet 0   triamcinolone cream (KENALOG) 0.1 % Apply 1 application topically 2 (two) times daily. 453.6 g 0   vitamin C (ASCORBIC ACID) 500 MG tablet Take 500 mg by mouth daily.     No facility-administered medications prior to visit.    Review of Systems;  Patient denies headache, fevers, malaise, unintentional weight loss, skin rash, eye pain, sinus congestion and sinus pain, sore throat, dysphagia,  hemoptysis , cough, dyspnea, wheezing, chest pain, palpitations, orthopnea, edema, abdominal pain, nausea, melena, diarrhea, constipation, flank pain, dysuria, hematuria, urinary  Frequency, nocturia, numbness, tingling, seizures,  Focal weakness, Loss of consciousness,  Tremor, insomnia, depression, anxiety, and suicidal ideation.      Objective:  BP 108/76 (BP Location: Left Arm, Patient Position: Sitting, Cuff Size: Normal)   Pulse 80   Temp (!) 96.1 F (35.6 C) (Temporal)   Resp 14   Ht '5\' 4"'$  (1.626 m)  Wt 124 lb (56.2 kg)   SpO2 97%   BMI 21.28 kg/m   BP Readings from Last 3 Encounters:  08/02/21 108/76  07/18/21 102/68  02/27/21 (!) 88/60    Wt Readings from Last 3 Encounters:  08/02/21 124 lb (56.2 kg)  07/18/21 124 lb 9.6 oz (56.5 kg)  07/11/21 121 lb (54.9 kg)    General appearance: alert, cooperative and appears stated age  Physical Exam Chest:     No skin changes or axillary LAD  Lab Results  Component Value Date   HGBA1C 6.0 05/16/2020   HGBA1C 6.0 06/27/2019    Lab Results  Component Value Date   CREATININE 0.83  07/16/2021   CREATININE 0.83 06/13/2020   CREATININE 0.87 05/16/2020    Lab Results  Component Value Date   WBC 9.2 07/16/2021   HGB 13.8 07/16/2021   HCT 41.7 07/16/2021   PLT 369.0 07/16/2021   GLUCOSE 86 07/16/2021   CHOL 230 (H) 07/16/2021   TRIG 90.0 07/16/2021   HDL 112.90 07/16/2021   LDLCALC 99 07/16/2021   ALT 14 07/16/2021   AST 15 07/16/2021   NA 134 (L) 07/16/2021   K 5.1 07/16/2021   CL 98 07/16/2021   CREATININE 0.83 07/16/2021   BUN 23 07/16/2021   CO2 28 07/16/2021   TSH 2.24 07/16/2021   HGBA1C 6.0 05/16/2020    MM 3D SCREEN BREAST BILATERAL  Result Date: 02/19/2021 CLINICAL DATA:  Screening. EXAM: DIGITAL SCREENING BILATERAL MAMMOGRAM WITH TOMOSYNTHESIS AND CAD TECHNIQUE: Bilateral screening digital craniocaudal and mediolateral oblique mammograms were obtained. Bilateral screening digital breast tomosynthesis was performed. The images were evaluated with computer-aided detection. COMPARISON:  Previous exam(s). ACR Breast Density Category c: The breast tissue is heterogeneously dense, which may obscure small masses. FINDINGS: There are no findings suspicious for malignancy. The images were evaluated with computer-aided detection. IMPRESSION: No mammographic evidence of malignancy. A result letter of this screening mammogram will be mailed directly to the patient. RECOMMENDATION: Screening mammogram in one year. (Code:SM-B-01Y) BI-RADS CATEGORY  1: Negative. Electronically Signed   By: Marin Olp M.D.   On: 02/19/2021 14:16    Assessment & Plan:   Problem List Items Addressed This Visit       Unprioritized   Breast tenderness in female - Primary    Left breast tenderness in area with palpable cyst.  Diagnostic mammogram and [place] ordered       Relevant Orders   MM Digital Diagnostic Unilat L   US BREAST LTD UNI LEFT INC AXILLA    I am having Veronica Ferrell "Jode Zeta" maintain her aspirin, calcium citrate, multivitamin with minerals, vitamin C,  polyethylene glycol powder, Lactulose, triamcinolone cream, ALPRAZolam, diclofenac Sodium, mirtazapine, promethazine, cholecalciferol, hydrocortisone, estradiol, ipratropium, fludrocortisone, and omeprazole.  No orders of the defined types were placed in this encounter.   There are no discontinued medications.  Follow-up: No follow-ups on file.   Crecencio Mc, MD

## 2021-08-08 ENCOUNTER — Ambulatory Visit
Admission: RE | Admit: 2021-08-08 | Discharge: 2021-08-08 | Disposition: A | Discharge status: Home / Self Care | Payer: Medicare PPO | Source: Ambulatory Visit | Attending: Internal Medicine | Admitting: Internal Medicine

## 2021-08-08 ENCOUNTER — Other Ambulatory Visit: Payer: Self-pay

## 2021-08-08 ENCOUNTER — Other Ambulatory Visit: Payer: Self-pay | Admitting: Internal Medicine

## 2021-08-08 DIAGNOSIS — N644 Mastodynia: Secondary | ICD-10-CM | POA: Diagnosis not present

## 2021-08-08 DIAGNOSIS — R922 Inconclusive mammogram: Secondary | ICD-10-CM | POA: Diagnosis not present

## 2021-08-23 ENCOUNTER — Other Ambulatory Visit: Payer: Self-pay | Admitting: Internal Medicine

## 2021-08-23 NOTE — Telephone Encounter (Signed)
RX Refill:cortef Last Seen:08-02-21 Last ordered:05-23-21

## 2021-09-26 ENCOUNTER — Other Ambulatory Visit: Payer: Self-pay | Admitting: Internal Medicine

## 2021-09-27 MED ORDER — LACTULOSE 20 GM/30ML PO SOLN: ORAL | 3 refills | Status: DC

## 2021-09-27 NOTE — Telephone Encounter (Signed)
Yes,  lactulose refill sent

## 2021-10-17 NOTE — Telephone Encounter (Signed)
RX Refill: xanax Last Seen: 08-02-21 Last Ordered: 06-17-20 Next Appt: 01-28-22

## 2021-10-18 MED ORDER — ALPRAZOLAM 1 MG PO TABS: 1.0000 mg | ORAL_TABLET | Freq: Every evening | ORAL | 5 refills | Status: DC | PRN

## 2021-10-18 NOTE — Telephone Encounter (Signed)
Refill has been sent,  but you should have had refills through November on the previous prescription ?  Make sure this one has 5 refills showing on the bottle  Regards,   Deborra Medina, MD

## 2021-10-21 ENCOUNTER — Other Ambulatory Visit: Payer: Self-pay | Admitting: Internal Medicine

## 2021-10-21 DIAGNOSIS — K219 Gastro-esophageal reflux disease without esophagitis: Secondary | ICD-10-CM

## 2021-11-07 DIAGNOSIS — H02403 Unspecified ptosis of bilateral eyelids: Secondary | ICD-10-CM | POA: Diagnosis not present

## 2021-11-20 ENCOUNTER — Other Ambulatory Visit: Payer: Self-pay | Admitting: Internal Medicine

## 2022-01-09 ENCOUNTER — Other Ambulatory Visit: Payer: Self-pay | Admitting: Internal Medicine

## 2022-01-09 DIAGNOSIS — Z1231 Encounter for screening mammogram for malignant neoplasm of breast: Secondary | ICD-10-CM

## 2022-01-22 ENCOUNTER — Other Ambulatory Visit (INDEPENDENT_AMBULATORY_CARE_PROVIDER_SITE_OTHER): Payer: Medicare PPO

## 2022-01-22 ENCOUNTER — Other Ambulatory Visit: Payer: Self-pay

## 2022-01-22 DIAGNOSIS — E271 Primary adrenocortical insufficiency: Secondary | ICD-10-CM

## 2022-01-22 DIAGNOSIS — Z9229 Personal history of other drug therapy: Secondary | ICD-10-CM | POA: Diagnosis not present

## 2022-01-22 LAB — COMPREHENSIVE METABOLIC PANEL
ALT: 13 U/L (ref 0–35)
AST: 17 U/L (ref 0–37)
Albumin: 4.5 g/dL (ref 3.5–5.2)
Alkaline Phosphatase: 29 U/L — ABNORMAL LOW (ref 39–117)
BUN: 18 mg/dL (ref 6–23)
CO2: 31 mEq/L (ref 19–32)
Calcium: 9.7 mg/dL (ref 8.4–10.5)
Chloride: 97 mEq/L (ref 96–112)
Creatinine, Ser: 0.96 mg/dL (ref 0.40–1.20)
GFR: 59 mL/min — ABNORMAL LOW (ref >60.00)
Glucose, Bld: 80 mg/dL (ref 70–99)
Potassium: 5 mEq/L (ref 3.5–5.1)
Sodium: 133 mEq/L — ABNORMAL LOW (ref 135–145)
Total Bilirubin: 0.5 mg/dL (ref 0.2–1.2)
Total Protein: 7 g/dL (ref 6.0–8.3)

## 2022-01-22 LAB — CBC WITH DIFFERENTIAL/PLATELET
Basophils Absolute: 0 10*3/uL (ref 0.0–0.1)
Basophils Relative: 0.5 % (ref 0.0–3.0)
Eosinophils Absolute: 0.1 10*3/uL (ref 0.0–0.7)
Eosinophils Relative: 1.7 % (ref 0.0–5.0)
HCT: 42.2 % (ref 36.0–46.0)
Hemoglobin: 14 g/dL (ref 12.0–15.0)
Lymphocytes Relative: 52.9 % — ABNORMAL HIGH (ref 12.0–46.0)
Lymphs Abs: 3.6 10*3/uL (ref 0.7–4.0)
MCHC: 33.3 g/dL (ref 30.0–36.0)
MCV: 97.6 fl (ref 78.0–100.0)
Monocytes Absolute: 0.6 10*3/uL (ref 0.1–1.0)
Monocytes Relative: 9 % (ref 3.0–12.0)
Neutro Abs: 2.4 10*3/uL (ref 1.4–7.7)
Neutrophils Relative %: 35.9 % — ABNORMAL LOW (ref 43.0–77.0)
Platelets: 357 10*3/uL (ref 150.0–400.0)
RBC: 4.32 Mil/uL (ref 3.87–5.11)
RDW: 12.9 % (ref 11.5–15.5)
WBC: 6.8 10*3/uL (ref 4.0–10.5)

## 2022-01-22 LAB — HEMOGLOBIN A1C: Hgb A1c MFr Bld: 6.1 % (ref 4.6–6.5)

## 2022-01-25 LAB — SARS-COV-2 SEMI-QUANTITATIVE TOTAL ANTIBODY, SPIKE: SARS COV2 AB, Total Spike Semi QN: >2500 U/mL — ABNORMAL HIGH (ref <0.8)

## 2022-01-28 ENCOUNTER — Ambulatory Visit: Payer: Medicare PPO | Admitting: Internal Medicine

## 2022-01-29 DIAGNOSIS — M25562 Pain in left knee: Secondary | ICD-10-CM | POA: Diagnosis not present

## 2022-01-29 DIAGNOSIS — M25561 Pain in right knee: Secondary | ICD-10-CM | POA: Diagnosis not present

## 2022-01-30 ENCOUNTER — Ambulatory Visit: Payer: Medicare PPO | Admitting: Internal Medicine

## 2022-01-30 ENCOUNTER — Encounter: Payer: Self-pay | Admitting: Internal Medicine

## 2022-01-30 ENCOUNTER — Other Ambulatory Visit: Payer: Self-pay

## 2022-01-30 VITALS — BP 109/70 | HR 79 | Temp 98.7°F | Ht 64.0 in | Wt 126.0 lb

## 2022-01-30 DIAGNOSIS — R944 Abnormal results of kidney function studies: Secondary | ICD-10-CM

## 2022-01-30 DIAGNOSIS — R5383 Other fatigue: Secondary | ICD-10-CM | POA: Diagnosis not present

## 2022-01-30 DIAGNOSIS — E271 Primary adrenocortical insufficiency: Secondary | ICD-10-CM | POA: Diagnosis not present

## 2022-01-30 DIAGNOSIS — I700 Atherosclerosis of aorta: Secondary | ICD-10-CM | POA: Diagnosis not present

## 2022-01-30 DIAGNOSIS — R7303 Prediabetes: Secondary | ICD-10-CM

## 2022-01-30 LAB — BASIC METABOLIC PANEL
BUN: 23 mg/dL (ref 6–23)
CO2: 30 mEq/L (ref 19–32)
Calcium: 9.4 mg/dL (ref 8.4–10.5)
Chloride: 101 mEq/L (ref 96–112)
Creatinine, Ser: 0.83 mg/dL (ref 0.40–1.20)
GFR: 70.25 mL/min (ref >60.00)
Glucose, Bld: 86 mg/dL (ref 70–99)
Potassium: 4.3 mEq/L (ref 3.5–5.1)
Sodium: 136 mEq/L (ref 135–145)

## 2022-01-30 MED ORDER — TETANUS-DIPHTH-ACELL PERTUSSIS 5-2.5-18.5 LF-MCG/0.5 IM SUSY: 0.5000 mL | PREFILLED_SYRINGE | Freq: Once | INTRAMUSCULAR | 0 refills | Status: AC

## 2022-01-30 NOTE — Progress Notes (Signed)
Patient ID: Veronica Ferrell, female    DOB: Feb 21, 1951  Age: 71 y.o. MRN: [id number]  The patient is here for follow up and  management of other chronic and acute problems.  This visit occurred during the SARS-CoV-2 public health emergency.  Safety protocols were in place, including screening questions prior to the visit, additional usage of staff PPE, and extensive cleaning of exam room while observing appropriate contact time as indicated for disinfecting solutions.     The risk factors are reflected in the social history.  CC: The primary encounter diagnosis was Decreased GFR. Diagnoses of Fatigue, unspecified type, Addison's disease (Veronica Ferrell), Abdominal aortic atherosclerosis (Veronica Ferrell), and Prediabetes were also pertinent to this visit.  1) She has been exercising  more regularly ,  dealing with some mild joint pain .  Saw orthopedics  for knee pain.  No meniscal tear diagnosed.  Likely due to playing pickle ball.   2) 3 viral infections since November. No fevers,  but experienced  excessive fatigue,  mild nausea without vomiting , mild headache   3)  Aortic atherosclerosis:  reviewed this incidental finding of mild scattered placques noted on CT abd and pelvis done In 2019.  Reviewed with patient. Minimal disease  History Veronica Ferrell has a past medical history of Addison's disease (Veronica Ferrell), Arthritis, Cancer (Veronica Ferrell), Chicken pox, Colon polyps, Concussion (12/26/2020), Diverticulitis, and GERD (gastroesophageal reflux disease).   She has a past surgical history that includes varicose veins; Breast surgery; Appendectomy; Abdominal hysterectomy; Foot surgery (Bilateral); Esophagogastroduodenoscopy (egd) with propofol (N/A, 04/10/2017); Colonoscopy with propofol (N/A, 07/14/2019); and Breast biopsy (Right, 2010).   Her family history includes Breast cancer in her maternal aunt, maternal aunt, and maternal grandmother; Breast cancer (age of onset: 72) in her mother; Cancer in her father, maternal aunt, maternal aunt,  and maternal grandmother; Cancer (age of onset: 49) in her mother.She reports that she has never smoked. She has never used smokeless tobacco. She reports current alcohol use of about 7.0 standard drinks per week. She reports that she does not use drugs.  Outpatient Medications Prior to Visit  Medication Sig Dispense Refill   ALPRAZolam (XANAX) 1 MG tablet Take 1 tablet (1 mg total) by mouth at bedtime as needed for anxiety. 30 tablet 5   aspirin 81 MG tablet Take 81 mg by mouth once a week.      calcium citrate (CALCITRATE - DOSED IN MG ELEMENTAL CALCIUM) 950 MG tablet Take 1 tablet by mouth daily.     cholecalciferol (VITAMIN D) 25 MCG (1000 UNIT) tablet Take 3 tablets (3,000 Units total) by mouth daily. 90 tablet 1   diclofenac Sodium (VOLTAREN) 1 % GEL APPLY 2 GRAMS TOPICALLY FOUR TIMES DAILY 100 g 2   estradiol (ESTRACE) 1 MG tablet TAKE ONE TABLET BY MOUTH EVERY DAY 90 tablet 3   fludrocortisone (FLORINEF) 0.1 MG tablet TAKE 1 TABLET BY MOUTH DAILY 90 tablet 1   hydrocortisone (CORTEF) 20 MG tablet TAKE ONE TABLET EACH MORNING AND ONE-HALF TABLET EVERY NIGHT AS DIRECTED 135 tablet 0   ipratropium (ATROVENT) 0.06 % nasal spray USE TWO SPRAYS INTO EACH NOSTRIL FOUR TIMES A DAY 15 mL 2   Lactulose 20 GM/30ML SOLN 30 ml every 4 hours until constipation is relieved 236 mL 3   mirtazapine (REMERON) 7.5 MG tablet TAKE ONE TABLET BY MOUTH AT BEDTIME 90 tablet 1   Multiple Vitamins-Minerals (MULTIVITAMIN WITH MINERALS) tablet Take 1 tablet by mouth daily.     omeprazole (PRILOSEC) 40 MG capsule  TAKE 1 CAPSULE BY MOUTH EVERY DAY 90 capsule 1   polyethylene glycol powder (GLYCOLAX/MIRALAX) powder USE AS DIRECTED 527 g 1   promethazine (PHENERGAN) 12.5 MG tablet Take 1 tablet (12.5 mg total) by mouth every 6 (six) hours as needed for nausea or vomiting. 30 tablet 0   triamcinolone cream (KENALOG) 0.1 % Apply 1 application topically 2 (two) times daily. 453.6 g 0   vitamin C (ASCORBIC ACID) 500 MG  tablet Take 500 mg by mouth daily.     No facility-administered medications prior to visit.    Review of Systems  Patient denies headache, fevers, malaise, unintentional weight loss, skin rash, eye pain, sinus congestion and sinus pain, sore throat, dysphagia,  hemoptysis , cough, dyspnea, wheezing, chest pain, palpitations, orthopnea, edema, abdominal pain, nausea, melena, diarrhea, constipation, flank pain, dysuria, hematuria, urinary  Frequency, nocturia, numbness, tingling, seizures,  Focal weakness, Loss of consciousness,  Tremor, insomnia, depression, anxiety, and suicidal ideation.     Objective:  BP 109/70 (BP Location: Left Arm, Patient Position: Sitting, Cuff Size: Small)    Pulse 79    Temp 98.7 F (37.1 C)    Ht 5\' 4"  (1.626 m)    Wt 126 lb (57.2 kg)    SpO2 99%    BMI 21.63 kg/m   Physical Exam  General appearance: alert, cooperative and appears stated age Head: Normocephalic, without obvious abnormality, atraumatic Eyes: conjunctivae/corneas clear. PERRL, EOM's intact. Fundi benign. Ears: normal TM's and external ear canals both ears Nose: Nares normal. Septum midline. Mucosa normal. No drainage or sinus tenderness. Throat: lips, mucosa, and tongue normal; teeth and gums normal Neck: no adenopathy, no carotid bruit, no JVD, supple, symmetrical, trachea midline and thyroid not enlarged, symmetric, no tenderness/mass/nodules Lungs: clear to auscultation bilaterally Heart: regular rate and rhythm, S1, S2 normal, no murmur, click, rub or gallop Abdomen: soft, non-tender; bowel sounds normal; no masses,  no organomegaly Extremities: extremities normal, atraumatic, no cyanosis or edema Pulses: 2+ and symmetric Skin: Skin color, texture, turgor normal. No rashes or lesions Neurologic: Alert and oriented X 3, normal strength and tone. Normal symmetric reflexes. Normal coordination and gait.     Assessment & Plan:   Problem List Items Addressed This Visit     Abdominal aortic  atherosclerosis (Veronica Ferrell)    Reviewed .  Minimal.  No treatment advised given her HDL/LDL ratio being > 1  Lab Results  Component Value Date   CHOL 230 (H) 07/16/2021   HDL 112.90 07/16/2021   LDLCALC 99 07/16/2021   TRIG 90.0 07/16/2021   CHOLHDL 2 07/16/2021         Addison's disease ([place])    She has been experiencing extreme  Fatigue episodes occurring during  viral infections but has not noted any hypotension  During episodes, although BP has been in the 13'K systolic..  Advised to increase florinef dose during those episodes   Lab Results  Component Value Date   NA 136 01/30/2022   K 4.3 01/30/2022   CL 101 01/30/2022   CO2 30 01/30/2022   Lab Results  Component Value Date   CREATININE 0.83 01/30/2022         Decreased GFR - Primary   Relevant Orders   Basic metabolic panel (Completed)   Fatigue    Occurring during  viral infection and lasting for several days, advised to increase hydrocortisone during illness      Prediabetes    Reviewed prepvious labs,  Fasting glucose.   he patient  feels generally well, is walking several times per week for exercise and weighing weekly .  Following a carbohydrate modified diet 5 days per week. Denies any significant weight change, and has no symptoms suggestive of diabetes (polyuria, polyphagia, polydipsia).         I am having Chauncey Cruel. Vanosdol "[name]" start on Tdap. I am also having her maintain her aspirin, calcium citrate, multivitamin with minerals, vitamin C, polyethylene glycol powder, triamcinolone cream, promethazine, cholecalciferol, estradiol, fludrocortisone, diclofenac Sodium, ipratropium, Lactulose, ALPRAZolam, omeprazole, hydrocortisone, and mirtazapine.  Meds ordered this encounter  Medications   Tdap (BOOSTRIX) 5-2.5-18.5 LF-MCG/0.5 injection    Sig: Inject 0.5 mLs into the muscle once for 1 dose.    Dispense:  0.5 mL    Refill:  0    There are no discontinued medications.  Follow-up: Return in about 1  year (around 01/30/2023).   Crecencio Mc, MD

## 2022-01-30 NOTE — Assessment & Plan Note (Signed)
Occurring during  viral infection and lasting for several days, advised to increase hydrocortisone during illness

## 2022-01-30 NOTE — Patient Instructions (Addendum)
Good to see you!  Enjoy your trip to [place] !  Take the meds with you   Your  need your tetanus-diptheria-pertussis vaccine (TDaP) but you can get it for FREE at your pharmacy with the script I have provided you.     Change estradiol to 1/2 tablet DAILY  TO MANAGE HOT FLASHES   Pickle juice can prevent muscle cramps.  The sodium and or the turmeric are the likely ingredients that help

## 2022-02-01 ENCOUNTER — Encounter: Payer: Self-pay | Admitting: Internal Medicine

## 2022-02-01 DIAGNOSIS — R7303 Prediabetes: Secondary | ICD-10-CM | POA: Insufficient documentation

## 2022-02-01 DIAGNOSIS — R944 Abnormal results of kidney function studies: Secondary | ICD-10-CM | POA: Insufficient documentation

## 2022-02-01 DIAGNOSIS — I700 Atherosclerosis of aorta: Secondary | ICD-10-CM | POA: Insufficient documentation

## 2022-02-01 NOTE — Assessment & Plan Note (Signed)
Reviewed prepvious labs,  Fasting glucose.   he patient feels generally well, is walking several times per week for exercise and weighing weekly .  Following a carbohydrate modified diet 5 days per week. Denies any significant weight change, and has no symptoms suggestive of diabetes (polyuria, polyphagia, polydipsia).

## 2022-02-01 NOTE — Assessment & Plan Note (Addendum)
Reviewed .  Minimal.  No treatment advised given her HDL/LDL ratio being > 1  Lab Results  Component Value Date   CHOL 230 (H) 07/16/2021   HDL 112.90 07/16/2021   LDLCALC 99 07/16/2021   TRIG 90.0 07/16/2021   CHOLHDL 2 07/16/2021

## 2022-02-01 NOTE — Assessment & Plan Note (Signed)
She has been experiencing extreme  Fatigue episodes occurring during  viral infections but has not noted any hypotension  During episodes, although BP has been in the 03'K systolic..  Advised to increase florinef dose during those episodes   Lab Results  Component Value Date   NA 136 01/30/2022   K 4.3 01/30/2022   CL 101 01/30/2022   CO2 30 01/30/2022   Lab Results  Component Value Date   CREATININE 0.83 01/30/2022

## 2022-02-03 DIAGNOSIS — L570 Actinic keratosis: Secondary | ICD-10-CM | POA: Diagnosis not present

## 2022-02-03 DIAGNOSIS — D2261 Melanocytic nevi of right upper limb, including shoulder: Secondary | ICD-10-CM | POA: Diagnosis not present

## 2022-02-03 DIAGNOSIS — D485 Neoplasm of uncertain behavior of skin: Secondary | ICD-10-CM | POA: Diagnosis not present

## 2022-02-03 DIAGNOSIS — Z85828 Personal history of other malignant neoplasm of skin: Secondary | ICD-10-CM | POA: Diagnosis not present

## 2022-02-03 DIAGNOSIS — D1801 Hemangioma of skin and subcutaneous tissue: Secondary | ICD-10-CM | POA: Diagnosis not present

## 2022-02-03 DIAGNOSIS — L821 Other seborrheic keratosis: Secondary | ICD-10-CM | POA: Diagnosis not present

## 2022-02-03 DIAGNOSIS — L820 Inflamed seborrheic keratosis: Secondary | ICD-10-CM | POA: Diagnosis not present

## 2022-02-19 ENCOUNTER — Ambulatory Visit
Admission: RE | Admit: 2022-02-19 | Discharge: 2022-02-19 | Disposition: A | Discharge status: Home / Self Care | Payer: Medicare PPO | Source: Ambulatory Visit | Attending: Internal Medicine | Admitting: Internal Medicine

## 2022-02-19 ENCOUNTER — Other Ambulatory Visit: Payer: Self-pay

## 2022-02-19 DIAGNOSIS — Z1231 Encounter for screening mammogram for malignant neoplasm of breast: Secondary | ICD-10-CM | POA: Insufficient documentation

## 2022-02-21 ENCOUNTER — Other Ambulatory Visit: Payer: Self-pay | Admitting: Internal Medicine

## 2022-02-24 ENCOUNTER — Other Ambulatory Visit: Payer: Self-pay | Admitting: Internal Medicine

## 2022-03-24 ENCOUNTER — Other Ambulatory Visit: Payer: Self-pay | Admitting: Internal Medicine

## 2022-04-07 ENCOUNTER — Other Ambulatory Visit: Payer: Self-pay | Admitting: Internal Medicine

## 2022-04-23 ENCOUNTER — Encounter: Payer: Self-pay | Admitting: Internal Medicine

## 2022-04-23 ENCOUNTER — Ambulatory Visit
Admission: RE | Admit: 2022-04-23 | Discharge: 2022-04-23 | Disposition: A | Discharge status: Home / Self Care | Payer: Medicare PPO | Source: Ambulatory Visit | Attending: Internal Medicine | Admitting: Internal Medicine

## 2022-04-23 ENCOUNTER — Ambulatory Visit: Payer: Medicare PPO | Admitting: Internal Medicine

## 2022-04-23 VITALS — BP 110/74 | HR 87 | Temp 97.7°F | Resp 14 | Ht 64.0 in | Wt 124.2 lb

## 2022-04-23 DIAGNOSIS — I8312 Varicose veins of left lower extremity with inflammation: Secondary | ICD-10-CM | POA: Diagnosis not present

## 2022-04-23 DIAGNOSIS — R2242 Localized swelling, mass and lump, left lower limb: Secondary | ICD-10-CM | POA: Insufficient documentation

## 2022-04-23 DIAGNOSIS — I8392 Asymptomatic varicose veins of left lower extremity: Secondary | ICD-10-CM | POA: Diagnosis not present

## 2022-04-23 DIAGNOSIS — W57XXXA Bitten or stung by nonvenomous insect and other nonvenomous arthropods, initial encounter: Secondary | ICD-10-CM | POA: Diagnosis not present

## 2022-04-23 DIAGNOSIS — S30860A Insect bite (nonvenomous) of lower back and pelvis, initial encounter: Secondary | ICD-10-CM | POA: Diagnosis not present

## 2022-04-23 DIAGNOSIS — M79662 Pain in left lower leg: Secondary | ICD-10-CM | POA: Diagnosis not present

## 2022-04-23 DIAGNOSIS — M7989 Other specified soft tissue disorders: Secondary | ICD-10-CM | POA: Diagnosis not present

## 2022-04-23 NOTE — Progress Notes (Signed)
Chief Complaint  ?Patient presents with  ? Acute Visit  ?  Disc about knot behind L knee, noticed it Monday night. Can't tell it's there until she crosses her legs. Swollen, denies any pain. Had tick bite 2 wks ago in her back. Saw Dr.Mcqueen and was prescribed doxy 100 mg 1 tab po x2 daily and using gentamicin cream w/relief. Wants blood drawn for tick bite.   ? ?F/u  ?1. Left knee mass 2.5-3 cm left posterior knee noticed Monday night pain with crossing legs  ?40 years ago had varicose vein stripping and had flight from [place] 9 hours came back Monday  ?Established with [facility] Dr .Mardelle Matte and wants to see [place] [facility] if wants to in future ? ?2. Tick bite left lower back 2 weeks ago on doxycycline bid per Dr. Tami Ribas and gentamycin wants tick born blood labs  ?3. C/o cough since returning from [place] disc lutein cough drops ? ?Review of Systems  ?Constitutional:  Negative for weight loss.  ?HENT:  Negative for hearing loss.   ?Eyes:  Negative for blurred vision.  ?Respiratory:  Negative for shortness of breath.   ?Cardiovascular:  Negative for chest pain.  ?Gastrointestinal:  Negative for abdominal pain and blood in stool.  ?Genitourinary:  Negative for dysuria.  ?Musculoskeletal:  Negative for falls and joint pain.  ?Skin:  Negative for rash.  ?Neurological:  Negative for headaches.  ?Psychiatric/Behavioral:  Negative for depression.   ?Past Medical History:  ?Diagnosis Date  ? Addison's disease ([place])   ? Arthritis   ? fingers  ? Cancer [facility])   ? skin  ? Chicken pox   ? Colon polyps   ? Concussion 12/26/2020  ? Diverticulitis   ? GERD (gastroesophageal reflux disease)   ? ?Past Surgical History:  ?Procedure Laterality Date  ? ABDOMINAL HYSTERECTOMY    ? APPENDECTOMY    ? BREAST EXCISIONAL BIOPSY Right   ? 2010  ? COLONOSCOPY WITH PROPOFOL N/A 07/14/2019  ? Procedure: COLONOSCOPY WITH PROPOFOL;  Surgeon: Lucilla Lame, MD;  Location: [facility] ENDOSCOPY;  Service: Endoscopy;  Laterality: N/A;  ? ESOPHAGOGASTRODUODENOSCOPY  (EGD) WITH PROPOFOL N/A 04/10/2017  ? Procedure: ESOPHAGOGASTRODUODENOSCOPY (EGD) WITH PROPOFOL;  Surgeon: Lucilla Lame, MD;  Location: [place];  Service: Endoscopy;  Laterality: N/A;  requeests early as possible  ? FOOT SURGERY Bilateral   ? varicose veins    ? ?Family History  ?Problem Relation Age of Onset  ? Cancer Mother 27  ?     Breast Ca  colon Ca (58)  and brain Ca (69)  ? Breast cancer Mother 53  ? Cancer Father   ? Cancer Maternal Aunt   ?     breast  ? Breast cancer Maternal Aunt   ? Cancer Maternal Grandmother   ?     breast  ? Breast cancer Maternal Grandmother   ? Cancer Maternal Aunt   ?     breast ca  ? Breast cancer Maternal Aunt   ? ?Social History  ? ?Socioeconomic History  ? Marital status: Married  ?  Spouse name: Not on file  ? Number of children: Not on file  ? Years of education: Not on file  ? Highest education level: Not on file  ?Occupational History  ? Not on file  ?Tobacco Use  ? Smoking status: Never  ? Smokeless tobacco: Never  ?Vaping Use  ? Vaping Use: Never used  ?Substance and Sexual Activity  ? Alcohol use: Yes  ?  Alcohol/week:  7.0 standard drinks  ?  Types: 7 Glasses of wine per week  ?  Comment: WINE EACH WEEK  ? Drug use: No  ? Sexual activity: Yes  ?Other Topics Concern  ? Not on file  ?Social History Narrative  ? Not on file  ? ?Social Determinants of Health  ? ?Financial Resource Strain: Low Risk   ? Difficulty of Paying Living Expenses: Not hard at all  ?Food Insecurity: No Food Insecurity  ? Worried About Charity fundraiser in the Last Year: Never true  ? Ran Out of Food in the Last Year: Never true  ?Transportation Needs: No Transportation Needs  ? Lack of Transportation (Medical): No  ? Lack of Transportation (Non-Medical): No  ?Physical Activity: Sufficiently Active  ? Days of Exercise per Week: 5 days  ? Minutes of Exercise per Session: 60 min  ?Stress: No Stress Concern Present  ? Feeling of Stress : Not at all  ?Social Connections: Unknown  ? Frequency  of Communication with Friends and Family: Not on file  ? Frequency of Social Gatherings with Friends and Family: Not on file  ? Attends Religious Services: Not on file  ? Active Member of Clubs or Organizations: Not on file  ? Attends Archivist Meetings: Not on file  ? Marital Status: Married  ?Intimate Partner Violence: Not At Risk  ? Fear of Current or Ex-Partner: No  ? Emotionally Abused: No  ? Physically Abused: No  ? Sexually Abused: No  ? ?Current Meds  ?Medication Sig  ? ALPRAZolam (XANAX) 1 MG tablet TAKE 1 TABLET BY MOUTH AT BEDTIME AS NEEDED FOR ANXIETY  ? aspirin 81 MG tablet Take 81 mg by mouth once a week.   ? calcium citrate (CALCITRATE - DOSED IN MG ELEMENTAL CALCIUM) 950 MG tablet Take 1 tablet by mouth daily.  ? cholecalciferol (VITAMIN D) 25 MCG (1000 UNIT) tablet Take 3 tablets (3,000 Units total) by mouth daily.  ? diclofenac Sodium (VOLTAREN) 1 % GEL APPLY 2 GRAMS TOPICALLY FOUR TIMES DAILY  ? doxycycline (VIBRA-TABS) 100 MG tablet SMARTSIG:1 Tablet(s) By Mouth Twice Daily  ? estradiol (ESTRACE) 1 MG tablet TAKE ONE TABLET BY MOUTH EVERY DAY  ? fludrocortisone (FLORINEF) 0.1 MG tablet TAKE 1 TABLET BY MOUTH DAILY  ? hydrocortisone (CORTEF) 20 MG tablet TAKE ONE TABLET EACH MORNING AND ONE-HALF TABLET EVERY NIGHT AS DIRECTED  ? ipratropium (ATROVENT) 0.06 % nasal spray USE TWO SPRAYS INTO EACH NOSTRIL FOUR TIMES A DAY  ? Lactulose 20 GM/30ML SOLN 30 ml every 4 hours until constipation is relieved  ? mirtazapine (REMERON) 7.5 MG tablet TAKE ONE TABLET BY MOUTH AT BEDTIME  ? Multiple Vitamins-Minerals (MULTIVITAMIN WITH MINERALS) tablet Take 1 tablet by mouth daily.  ? omeprazole (PRILOSEC) 40 MG capsule TAKE 1 CAPSULE BY MOUTH EVERY DAY  ? polyethylene glycol powder (GLYCOLAX/MIRALAX) powder USE AS DIRECTED  ? promethazine (PHENERGAN) 12.5 MG tablet Take 1 tablet (12.5 mg total) by mouth every 6 (six) hours as needed for nausea or vomiting.  ? triamcinolone cream (KENALOG) 0.1 % Apply 1  application topically 2 (two) times daily.  ? vitamin C (ASCORBIC ACID) 500 MG tablet Take 500 mg by mouth daily.  ? ?Allergies  ?Allergen Reactions  ? Alendronate Sodium Nausea Only  ? Boniva [Ibandronic Acid] Nausea And Vomiting  ? Clindamycin/Lincomycin Dermatitis  ? Tramadol   ?  Red face  ? Penicillins Rash  ? ?Recent Results (from the past 2160 hour(s))  ?Basic metabolic  panel     Status: None  ? Collection Time: 01/30/22 10:20 AM  ?Result Value Ref Range  ? Sodium 136 135 - 145 mEq/L  ? Potassium 4.3 3.5 - 5.1 mEq/L  ? Chloride 101 96 - 112 mEq/L  ? CO2 30 19 - 32 mEq/L  ? Glucose, Bld 86 70 - 99 mg/dL  ? BUN 23 6 - 23 mg/dL  ? Creatinine, Ser 0.83 0.40 - 1.20 mg/dL  ? GFR 70.25 >60.00 mL/min  ?  Comment: Calculated using the CKD-EPI Creatinine Equation (2021)  ? Calcium 9.4 8.4 - 10.5 mg/dL  ? ?Objective  ?Body mass index is 21.32 kg/m?. ?Wt Readings from Last 3 Encounters:  ?04/23/22 124 lb 3.2 oz (56.3 kg)  ?01/30/22 126 lb (57.2 kg)  ?08/02/21 124 lb (56.2 kg)  ? ?Temp Readings from Last 3 Encounters:  ?04/23/22 97.7 ?F (36.5 ?C) (Oral)  ?01/30/22 98.7 ?F (37.1 ?C)  ?08/02/21 (!) 96.1 ?F (35.6 ?C) (Temporal)  ? ?BP Readings from Last 3 Encounters:  ?04/23/22 110/74  ?01/30/22 109/70  ?08/02/21 108/76  ? ?Pulse Readings from Last 3 Encounters:  ?04/23/22 87  ?01/30/22 79  ?08/02/21 80  ? ? ?Physical Exam ?Vitals and nursing note reviewed.  ?Constitutional:   ?   Appearance: Normal appearance. She is well-developed and well-groomed.  ?HENT:  ?   Head: Normocephalic and atraumatic.  ?Eyes:  ?   Conjunctiva/sclera: Conjunctivae normal.  ?   Pupils: Pupils are equal, round, and reactive to light.  ?Cardiovascular:  ?   Rate and Rhythm: Normal rate and regular rhythm.  ?   Heart sounds: Normal heart sounds. No murmur heard. ?Pulmonary:  ?   Effort: Pulmonary effort is normal.  ?   Breath sounds: Normal breath sounds.  ?Abdominal:  ?   General: Abdomen is flat. Bowel sounds are normal.  ?   Tenderness: There is  no abdominal tenderness.  ?Musculoskeletal:     ?   General: No tenderness.  ?Skin: ?   General: Skin is warm and dry.  ? ?    ?Neurological:  ?   General: No focal deficit present.  ?   Mental Status: S

## 2022-04-23 NOTE — Patient Instructions (Signed)
Tick Bite Information, Adult Ticks are insects that draw blood for food. Most ticks live in shrubs and grassy and wooded areas. They climb onto people and animals that brush against the leaves and grasses that they rest on. Then they bite, attaching themselves to the skin. Most ticks are harmless, but some ticks may carry germs that can spread to a person through a bite and cause a disease. To reduce your risk of getting a disease from a tick bite, make sure you: Take steps to prevent tick bites. Check for ticks after being outdoors where ticks live. Watch for symptoms of disease if a tick attached to you or if you suspect a tick bite. How can I prevent tick bites? Take these steps to help prevent tick bites when you go outdoors in an area where ticks live: Use insect repellent Use insect repellent that has DEET (20% or higher), picaridin, or IR3535 in it. Follow the instructions on the label. Use these products on: Bare skin. The top of your boots. Your pant legs. Your sleeve cuffs. For insect repellent that contains permethrin, follow the instructions on the label. Use these products on: Clothing. Boots. Outdoor gear. Tents. When you are outside Wear protective clothing. Long sleeves and long pants offer the best protection from ticks. Wear light-colored clothing so you can see ticks more easily. Tuck your pant legs into your socks. If you go walking on a trail, stay in the middle of the trail so your skin, hair, and clothing do not touch the bushes. Avoid walking through areas with long grass. Check for ticks on your clothing, hair, and skin often while you are outside, and check again before you go inside. Make sure to check the scalp, neck, armpits, waist, groin, and joint areas. These are the spots where ticks attach themselves most often. When you go indoors Check your clothing for ticks. Tumble dry clothes in a dryer on high heat for at least 10 minutes. If clothes are damp,  additional time may be needed. If clothes require washing, use hot water. Examine gear and pets. Shower soon after being outdoors. Check your body for ticks. Conduct a full body check using a mirror. What is the proper way to remove a tick? If you find a tick on your body, remove it as soon as possible. Removing a tick sooner can prevent germs from passing to your body. Do not remove the tick with your bare fingers. To remove a tick that is crawling on your skin but has not bitten, use either of these methods: Go outdoors and brush the tick off. Remove the tick with tape or a lint roller. To remove a tick that is attached to your skin: Wash your hands. If you have latex gloves, put them on. Use fine-tipped tweezers, curved forceps, or a tick-removal tool to gently grasp the tick as close to your skin and the tick's head as possible. Gently pull with a steady, upward, even pressure until the tick lets go. When removing the tick: Take care to keep the tick's head attached to its body. Do not twist or jerk the tick. This can make the tick's head or mouth parts break off and remain in the skin. Do not squeeze or crush the tick's body. This could force disease-carrying fluids from the tick into your body. Do not try to remove a tick with heat, alcohol, petroleum jelly, or fingernail polish. Using these methods can cause the tick to salivate and regurgitate into your bloodstream,   increasing your risk of getting a disease. What should I do after removing a tick? Dispose of the tick. Do not crush a tick with your fingers. Clean the bite area and your hands with soap and water, rubbing alcohol, or an iodine scrub. If an antiseptic cream or ointment is available, apply a small amount to the bite site. Wash and disinfect any instruments that you used to remove the tick. How should I dispose of a tick? To dispose of a live tick, use one of these methods: Place it in rubbing alcohol. Place it in a sealed  bag or container. Wrap it tightly in tape. Flush it down the toilet. Contact a health care provider if: You have symptoms of a disease after a tick bite. Symptoms of a tick-borne disease can occur from moments after the tick bites to 30 days after a tick is removed. Symptoms include: Fever or chills. Any of these signs in the bite area: A red rash that makes a circle (bull's-eye rash) in the bite area. Redness and swelling. Headache. Muscle, joint, or bone pain. Abnormal tiredness. Numbness in your legs or difficulty walking or moving your legs. Tender, swollen lymph glands. A part of a tick breaks off and gets stuck in your skin. Get help right away if: You are not able to remove a tick. You experience muscle weakness or paralysis. Your symptoms get worse or you experience new symptoms. You find an engorged tick on your skin and you are in an area where disease from ticks is a high risk. Summary Ticks may carry germs that can spread to a person through a bite and cause a disease. Wear protective clothing and use insect repellent to prevent tick bites. Follow the instructions on the label. If you find a tick on your body, remove it as soon as possible. If the tick is attached, do not try to remove with heat, alcohol, petroleum jelly, or fingernail polish. Remove the attached tick using fine-tipped tweezers, curved forceps, or a tick-removal tool. Gently pull with steady, upward, even pressure until the tick lets go. Do not twist or jerk the tick. Do not squeeze or crush the tick's body. If you have symptoms of a disease after being bitten by a tick, contact a health care provider. This information is not intended to replace advice given to you by your health care provider. Make sure you discuss any questions you have with your health care provider. Document Revised: 12/05/2019 Document Reviewed: 12/05/2019 Elsevier Patient Education  2023 [employer]  

## 2022-04-23 NOTE — Addendum Note (Signed)
Addended by: Orland Mustard on: 04/23/2022 04:58 PM ? ? Modules accepted: Orders ? ?

## 2022-04-25 ENCOUNTER — Encounter: Payer: Self-pay | Admitting: Internal Medicine

## 2022-04-28 LAB — B. BURGDORFI ANTIBODIES: B burgdorferi Ab IgG+IgM: <0.9 index

## 2022-04-28 LAB — ROCKY MTN SPOTTED FVR ABS PNL(IGG+IGM)
RMSF IgG: NOT DETECTED
RMSF IgM: NOT DETECTED

## 2022-04-30 ENCOUNTER — Other Ambulatory Visit: Payer: Self-pay | Admitting: Internal Medicine

## 2022-05-26 ENCOUNTER — Telehealth (INDEPENDENT_AMBULATORY_CARE_PROVIDER_SITE_OTHER): Payer: Medicare PPO | Admitting: Internal Medicine

## 2022-05-26 ENCOUNTER — Encounter: Payer: Self-pay | Admitting: Internal Medicine

## 2022-05-26 ENCOUNTER — Other Ambulatory Visit: Payer: Self-pay | Admitting: Internal Medicine

## 2022-05-26 VITALS — Ht 64.0 in | Wt 124.0 lb

## 2022-05-26 DIAGNOSIS — J069 Acute upper respiratory infection, unspecified: Secondary | ICD-10-CM

## 2022-05-26 DIAGNOSIS — E271 Primary adrenocortical insufficiency: Secondary | ICD-10-CM

## 2022-05-26 MED ORDER — PREDNISONE 10 MG PO TABS: ORAL_TABLET | ORAL | 0 refills | Status: DC

## 2022-05-26 MED ORDER — HYDROCOD POLI-CHLORPHE POLI ER 10-8 MG/5ML PO SUER: 5.0000 mL | Freq: Every evening | ORAL | 0 refills | Status: DC | PRN

## 2022-05-26 MED ORDER — HYDROCORTISONE 20 MG PO TABS: ORAL_TABLET | ORAL | 2 refills | Status: DC

## 2022-05-26 NOTE — Progress Notes (Unsigned)
Virtual Visit via [facility] [place] Note  This visit type was conducted due to national recommendations for restrictions regarding the COVID-19 pandemic (e.g. social distancing).  This format is felt to be most appropriate for this patient at this time.  All issues noted in this document were discussed and addressed.  No physical exam was performed (except for noted visual exam findings with Video Visits).   I connected withNAME@ on 05/27/22 at  4:30 PM EDT by a video enabled telemedicine application  and verified that I am speaking with the correct person using two identifiers. Location patient: home Location provider: work or home office Persons participating in the virtual visit: patient, provider  I discussed the limitations, risks, security and privacy concerns of performing an evaluation and management service by telephone and the availability of in person appointments. I also discussed with the patient that there may be a patient responsible charge related to this service. The patient expressed understanding and agreed to proceed.   Reason for visit: URI  HPI:  71 yr old female with Addison's Disease presents with pharyngitis,  nonproductive cough and profound fatigue .  Symptoms started 3 days ago.   No fevers,  no white patches or ulcerations on tonsills.  Stools have been a little loose  No recent travel or sick contacts.    ROS: See pertinent positives and negatives per HPI.  Past Medical History:  Diagnosis Date   Addison's disease ([place])    Arthritis    fingers   Cancer ([place])    skin   Chicken pox    Colon polyps    Concussion 12/26/2020   Diverticulitis    GERD (gastroesophageal reflux disease)     Past Surgical History:  Procedure Laterality Date   ABDOMINAL HYSTERECTOMY     APPENDECTOMY     BREAST EXCISIONAL BIOPSY Right    2010   COLONOSCOPY WITH PROPOFOL N/A 07/14/2019   Procedure: COLONOSCOPY WITH PROPOFOL;  Surgeon: Lucilla Lame, MD;  Location: [facility] ENDOSCOPY;   Service: Endoscopy;  Laterality: N/A;   ESOPHAGOGASTRODUODENOSCOPY (EGD) WITH PROPOFOL N/A 04/10/2017   Procedure: ESOPHAGOGASTRODUODENOSCOPY (EGD) WITH PROPOFOL;  Surgeon: Lucilla Lame, MD;  Location: [facility];  Service: Endoscopy;  Laterality: N/A;  requeests early as possible   FOOT SURGERY Bilateral    varicose veins      Family History  Problem Relation Age of Onset   Cancer Mother 34       Breast Ca  colon Ca (58)  and brain Ca (66)   Breast cancer Mother 52   Cancer Father    Cancer Maternal Aunt        breast   Breast cancer Maternal Aunt    Cancer Maternal Grandmother        breast   Breast cancer Maternal Grandmother    Cancer Maternal Aunt        breast ca   Breast cancer Maternal Aunt     SOCIAL HX:  reports that she has never smoked. She has never used smokeless tobacco. She reports current alcohol use of about 7.0 standard drinks per week. She reports that she does not use drugs.    Current Outpatient Medications:    ALPRAZolam (XANAX) 1 MG tablet, TAKE 1 TABLET BY MOUTH AT BEDTIME AS NEEDED FOR ANXIETY, Disp: 30 tablet, Rfl: 5   aspirin 81 MG tablet, Take 81 mg by mouth once a week. , Disp: , Rfl:    calcium citrate (CALCITRATE - DOSED IN MG ELEMENTAL CALCIUM) 950  MG tablet, Take 1 tablet by mouth daily., Disp: , Rfl:    chlorpheniramine-HYDROcodone (TUSSIONEX PENNKINETIC ER) 10-8 MG/5ML, Take 5 mLs by mouth at bedtime as needed., Disp: 140 mL, Rfl: 0   cholecalciferol (VITAMIN D) 25 MCG (1000 UNIT) tablet, Take 3 tablets (3,000 Units total) by mouth daily., Disp: 90 tablet, Rfl: 1   diclofenac Sodium (VOLTAREN) 1 % GEL, APPLY 2 GRAMS TOPICALLY FOUR TIMES DAILY, Disp: 100 g, Rfl: 2   estradiol (ESTRACE) 1 MG tablet, TAKE ONE TABLET BY MOUTH EVERY DAY, Disp: 90 tablet, Rfl: 3   fludrocortisone (FLORINEF) 0.1 MG tablet, TAKE 1 TABLET BY MOUTH DAILY, Disp: 90 tablet, Rfl: 1   ipratropium (ATROVENT) 0.06 % nasal spray, USE TWO SPRAYS INTO EACH NOSTRIL FOUR  TIMES A DAY, Disp: 15 mL, Rfl: 2   Lactulose 20 GM/30ML SOLN, 30 ml every 4 hours until constipation is relieved, Disp: 236 mL, Rfl: 3   mirtazapine (REMERON) 7.5 MG tablet, TAKE ONE TABLET BY MOUTH AT BEDTIME, Disp: 90 tablet, Rfl: 1   Multiple Vitamins-Minerals (MULTIVITAMIN WITH MINERALS) tablet, Take 1 tablet by mouth daily., Disp: , Rfl:    omeprazole (PRILOSEC) 40 MG capsule, TAKE 1 CAPSULE BY MOUTH EVERY DAY, Disp: 90 capsule, Rfl: 1   polyethylene glycol powder (GLYCOLAX/MIRALAX) powder, USE AS DIRECTED, Disp: 527 g, Rfl: 1   predniSONE (DELTASONE) 10 MG tablet, 6 tablets daily for 3 days, then reduce by 1 tablet daily until gone, Disp: 33 tablet, Rfl: 0   promethazine (PHENERGAN) 12.5 MG tablet, Take 1 tablet (12.5 mg total) by mouth every 6 (six) hours as needed for nausea or vomiting., Disp: 30 tablet, Rfl: 0   triamcinolone cream (KENALOG) 0.1 %, Apply 1 application topically 2 (two) times daily., Disp: 453.6 g, Rfl: 0   vitamin C (ASCORBIC ACID) 500 MG tablet, Take 500 mg by mouth daily., Disp: , Rfl:    hydrocortisone (CORTEF) 20 MG tablet, TAKE ONE TABLET EACH MORNING AND ONE-HALF TABLET EVERY NIGHT AS DIRECTED, Disp: 135 tablet, Rfl: 2  EXAM:  VITALS per patient if applicable:  GENERAL: alert, oriented,  ill but  in no acute distress  HEENT: atraumatic, conjunttiva clear, no obvious abnormalities on inspection of external nose and ears  NECK: normal movements of the head and neck  LUNGS: on inspection no signs of respiratory distress, breathing rate appears normal, no obvious gross SOB, gasping or wheezing  CV: no obvious cyanosis  MS: moves all visible extremities without noticeable abnormality  PSYCH/NEURO: pleasant and cooperative, no obvious depression or anxiety, speech and thought processing grossly intact  ASSESSMENT AND PLAN:  Discussed the following assessment and plan:  URI, acute - Plan: POCT rapid strep A, POC COVID-19  Addison's disease ([facility])  Viral  upper respiratory tract infection  Addison's disease ([place]) She has increased her hydrocortisone dose for the last 2-3 days during her acute illness.  Starting a prednisone taper today   URI (upper respiratory infection) Given her severe pharyngitis without ulcerations and fevers  Will start prednisone and cough suppressant and scheduled strep and covid POC testing for the morning     I discussed the assessment and treatment plan with the patient. The patient was provided an opportunity to ask questions and all were answered. The patient agreed with the plan and demonstrated an understanding of the instructions.   The patient was advised to call back or seek an in-person evaluation if the symptoms worsen or if the condition fails to improve as anticipated.  I spent 20 minutes dedicated to the care of this patient on the date of this encounter to include pre-visit review of hiermedical history,  Face-to-face time with the patient , and post visit ordering of testing and therapeutics.    Crecencio Mc, MD

## 2022-05-27 ENCOUNTER — Encounter: Payer: Self-pay | Admitting: Internal Medicine

## 2022-05-27 ENCOUNTER — Ambulatory Visit (INDEPENDENT_AMBULATORY_CARE_PROVIDER_SITE_OTHER): Payer: Medicare PPO | Admitting: *Deleted

## 2022-05-27 DIAGNOSIS — J069 Acute upper respiratory infection, unspecified: Secondary | ICD-10-CM | POA: Insufficient documentation

## 2022-05-27 LAB — POC COVID19 BINAXNOW

## 2022-05-27 LAB — POCT RAPID STREP A (OFFICE): Rapid Strep A Screen: NEGATIVE

## 2022-05-27 MED ORDER — AZITHROMYCIN 500 MG PO TABS: 500.0000 mg | ORAL_TABLET | Freq: Every day | ORAL | 0 refills | Status: DC

## 2022-05-27 NOTE — Assessment & Plan Note (Signed)
She has increased her hydrocortisone dose for the last 2-3 days during her acute illness.  Starting a prednisone taper today

## 2022-05-27 NOTE — Assessment & Plan Note (Signed)
STREP and COVID negative.  Treat for vial syndrome.  Add abx if no improvement in 48 hours.

## 2022-05-27 NOTE — Addendum Note (Signed)
Addended by: Crecencio Mc on: 05/27/2022 12:31 PM   Modules accepted: Orders

## 2022-05-27 NOTE — Assessment & Plan Note (Signed)
Given her severe pharyngitis without ulcerations and fevers  Will start prednisone and cough suppressant and scheduled strep and covid POC testing for the morning

## 2022-06-03 ENCOUNTER — Other Ambulatory Visit: Payer: Self-pay

## 2022-06-03 DIAGNOSIS — I8312 Varicose veins of left lower extremity with inflammation: Secondary | ICD-10-CM

## 2022-06-11 ENCOUNTER — Encounter: Payer: Self-pay | Admitting: Vascular Surgery

## 2022-06-11 ENCOUNTER — Ambulatory Visit (HOSPITAL_COMMUNITY)
Admission: RE | Admit: 2022-06-11 | Discharge: 2022-06-11 | Disposition: A | Discharge status: Home / Self Care | Payer: Medicare PPO | Source: Ambulatory Visit | Attending: Vascular Surgery | Admitting: Vascular Surgery

## 2022-06-11 ENCOUNTER — Ambulatory Visit: Payer: Medicare PPO | Admitting: Vascular Surgery

## 2022-06-11 VITALS — BP 96/70 | HR 89 | Temp 98.1°F | Resp 14 | Ht 64.0 in | Wt 123.0 lb

## 2022-06-11 DIAGNOSIS — I809 Phlebitis and thrombophlebitis of unspecified site: Secondary | ICD-10-CM | POA: Diagnosis not present

## 2022-06-11 DIAGNOSIS — I872 Venous insufficiency (chronic) (peripheral): Secondary | ICD-10-CM | POA: Diagnosis not present

## 2022-06-11 DIAGNOSIS — I8312 Varicose veins of left lower extremity with inflammation: Secondary | ICD-10-CM

## 2022-06-11 NOTE — Progress Notes (Signed)
ASSESSMENT & PLAN   PHLEBITIS: This patient has resolving phlebitis of her posterior left leg.  Currently she is not having significant symptoms.  She does have some deep venous reflux on the left with evidence of mild chronic venous insufficiency.  We have discussed the importance of leg elevation and the proper positioning for this.  I have encouraged her to consider wearing knee-high compression stockings with a gradient of 15 to 20 mmHg especially when she is traveling or will be sitting or standing for prolonged period of time.  We discussed the importance of exercise specifically walking and water aerobics.  I encouraged her to avoid prolonged sitting and standing.  I be happy to see her back at any time if any new vascular issues arise.  I have explained that if she gets a recurrent episode of phlebitis this is typically treated with elevation, compression, ibuprofen, and warm compresses.  REASON FOR CONSULT:    Thrombosed varicose veins left leg.  The consult is requested by Dr. Sherin Quarry.  HPI:   Veronica Ferrell is a 71 y.o. female who is referred with phlebitis of her left leg.  She was returning from [place] on a trip on eight 9-hour flight.  When she got home she noted some tenderness behind her left knee.  She was having some pain in the leg and this prompted a duplex scan that was done on 04/23/2022 which showed no evidence of DVT but did show some thrombosed superficial veins in her left posterior calf proximally.  Her symptoms have been persistent and she presents for vascular consultation.  She has undergone saphenous vein stripping bilaterally in the past.  She denies any history of DVT or previous episodes of phlebitis.  She does not wear compression stockings.  She does try to elevate her legs some.  She does describe some aching pain and heaviness in both legs which is aggravated by standing and sitting and relieved with elevation.  Past Medical History:  Diagnosis Date    Addison's disease ([place])    Arthritis    fingers   Cancer ([place])    skin   Chicken pox    Colon polyps    Concussion 12/26/2020   Diverticulitis    GERD (gastroesophageal reflux disease)     Family History  Problem Relation Age of Onset   Cancer Mother 22       Breast Ca  colon Ca (58)  and brain Ca (69)   Breast cancer Mother 69   Cancer Father    Cancer Maternal Aunt        breast   Breast cancer Maternal Aunt    Cancer Maternal Grandmother        breast   Breast cancer Maternal Grandmother    Cancer Maternal Aunt        breast ca   Breast cancer Maternal Aunt     SOCIAL HISTORY: Social History   Tobacco Use   Smoking status: Never   Smokeless tobacco: Never  Substance Use Topics   Alcohol use: Yes    Alcohol/week: 7.0 standard drinks of alcohol    Types: 7 Glasses of wine per week    Comment: WINE EACH WEEK    Allergies  Allergen Reactions   Alendronate Sodium Nausea Only   Boniva [Ibandronic Acid] Nausea And Vomiting   Clindamycin/Lincomycin Dermatitis   Tramadol     Red face   Penicillins Rash    Current Outpatient Medications  Medication Sig Dispense  Refill   ALPRAZolam (XANAX) 1 MG tablet TAKE 1 TABLET BY MOUTH AT BEDTIME AS NEEDED FOR ANXIETY 30 tablet 5   aspirin 81 MG tablet Take 81 mg by mouth once a week.      azithromycin (ZITHROMAX) 500 MG tablet Take 1 tablet (500 mg total) by mouth daily. 7 tablet 0   calcium citrate (CALCITRATE - DOSED IN MG ELEMENTAL CALCIUM) 950 MG tablet Take 1 tablet by mouth daily.     chlorpheniramine-HYDROcodone (TUSSIONEX PENNKINETIC ER) 10-8 MG/5ML Take 5 mLs by mouth at bedtime as needed. 140 mL 0   cholecalciferol (VITAMIN D) 25 MCG (1000 UNIT) tablet Take 3 tablets (3,000 Units total) by mouth daily. 90 tablet 1   diclofenac Sodium (VOLTAREN) 1 % GEL APPLY 2 GRAMS TOPICALLY FOUR TIMES DAILY 100 g 2   estradiol (ESTRACE) 1 MG tablet TAKE ONE TABLET BY MOUTH EVERY DAY 90 tablet 3   fludrocortisone (FLORINEF) 0.1 MG  tablet TAKE 1 TABLET BY MOUTH DAILY 90 tablet 1   hydrocortisone (CORTEF) 20 MG tablet TAKE ONE TABLET EACH MORNING AND ONE-HALF TABLET EVERY NIGHT AS DIRECTED 135 tablet 2   ipratropium (ATROVENT) 0.06 % nasal spray USE TWO SPRAYS INTO EACH NOSTRIL FOUR TIMES A DAY 15 mL 2   Lactulose 20 GM/30ML SOLN 30 ml every 4 hours until constipation is relieved 236 mL 3   mirtazapine (REMERON) 7.5 MG tablet TAKE ONE TABLET BY MOUTH AT BEDTIME 90 tablet 1   Multiple Vitamins-Minerals (MULTIVITAMIN WITH MINERALS) tablet Take 1 tablet by mouth daily.     omeprazole (PRILOSEC) 40 MG capsule TAKE 1 CAPSULE BY MOUTH EVERY DAY 90 capsule 1   polyethylene glycol powder (GLYCOLAX/MIRALAX) powder USE AS DIRECTED 527 g 1   triamcinolone cream (KENALOG) 0.1 % Apply 1 application topically 2 (two) times daily. 453.6 g 0   vitamin C (ASCORBIC ACID) 500 MG tablet Take 500 mg by mouth daily.     predniSONE (DELTASONE) 10 MG tablet 6 tablets daily for 3 days, then reduce by 1 tablet daily until gone (Patient not taking: Reported on 06/11/2022) 33 tablet 0   promethazine (PHENERGAN) 12.5 MG tablet Take 1 tablet (12.5 mg total) by mouth every 6 (six) hours as needed for nausea or vomiting. (Patient not taking: Reported on 06/11/2022) 30 tablet 0   No current facility-administered medications for this visit.    REVIEW OF SYSTEMS:  '[X]'$  denotes positive finding, '[ ]'$  denotes negative finding Cardiac  Comments:  Chest pain or chest pressure:    Shortness of breath upon exertion:    Short of breath when lying flat:    Irregular heart rhythm:        Vascular    Pain in calf, thigh, or hip brought on by ambulation:    Pain in feet at night that wakes you up from your sleep:     Blood clot in your veins:    Leg swelling:         Pulmonary    Oxygen at home:    Productive cough:     Wheezing:         Neurologic    Sudden weakness in arms or legs:     Sudden numbness in arms or legs:     Sudden onset of difficulty  speaking or slurred speech:    Temporary loss of vision in one eye:     Problems with dizziness:         Gastrointestinal    Blood  in stool:     Vomited blood:         Genitourinary    Burning when urinating:     Blood in urine:        Psychiatric    Major depression:         Hematologic    Bleeding problems:    Problems with blood clotting too easily:        Skin    Rashes or ulcers:        Constitutional    Fever or chills:    -  PHYSICAL EXAM:   Vitals:   06/11/22 1515  BP: 96/70  Pulse: 89  Resp: 14  Temp: 98.1 F (36.7 C)  TempSrc: Temporal  SpO2: 98%  Weight: 123 lb (55.8 kg)  Height: '5\' 4"'$  (1.626 m)   Body mass index is 21.11 kg/m. GENERAL: The patient is a well-nourished female, in no acute distress. The vital signs are documented above. CARDIAC: There is a regular rate and rhythm.  VASCULAR: I do not detect carotid bruits. She has palpable dorsalis pedis and posterior tibial pulses bilaterally. She has some telangiectasias and reticular veins in both legs. She has some slight induration in her left proximal posterior calf where she had phlebitis.   PULMONARY: There is good air exchange bilaterally without wheezing or rales. ABDOMEN: Soft and non-tender with normal pitched bowel sounds.  MUSCULOSKELETAL: There are no major deformities. NEUROLOGIC: No focal weakness or paresthesias are detected. SKIN: There are no ulcers or rashes noted. PSYCHIATRIC: The patient has a normal affect.  DATA:    VENOUS DUPLEX: I have independently interpreted her venous duplex scan today.  This was of the left lower extremity only.  There is no evidence of DVT.  There was deep venous reflux in the common femoral vein.  There was no significant superficial venous reflux.  The left great saphenous vein could not be identified.  This has been previously stripped.  The small saphenous vein was noncompressible suggesting chronic phlebitis.     VENOUS DUPLEX: I did  review the venous duplex scan that was done on 04/23/2022 because of left leg pain.  This showed no evidence of DVT in the left lower extremity.  There were thrombosed varicose veins in the posterior aspect of her left lower leg.  Deitra [facility]

## 2022-07-10 ENCOUNTER — Telehealth: Payer: Self-pay | Admitting: *Deleted

## 2022-07-10 DIAGNOSIS — R7303 Prediabetes: Secondary | ICD-10-CM

## 2022-07-10 DIAGNOSIS — E271 Primary adrenocortical insufficiency: Secondary | ICD-10-CM

## 2022-07-10 DIAGNOSIS — E559 Vitamin D deficiency, unspecified: Secondary | ICD-10-CM

## 2022-07-10 NOTE — Telephone Encounter (Signed)
Please place future orders for lab appt.  

## 2022-07-11 ENCOUNTER — Telehealth: Payer: Self-pay | Admitting: Internal Medicine

## 2022-07-11 NOTE — Telephone Encounter (Signed)
Spoke with patient she stated she was on vacation and req CB next week

## 2022-07-14 ENCOUNTER — Other Ambulatory Visit (INDEPENDENT_AMBULATORY_CARE_PROVIDER_SITE_OTHER): Payer: Medicare PPO

## 2022-07-14 ENCOUNTER — Other Ambulatory Visit: Payer: Medicare PPO

## 2022-07-14 DIAGNOSIS — E271 Primary adrenocortical insufficiency: Secondary | ICD-10-CM

## 2022-07-14 DIAGNOSIS — E559 Vitamin D deficiency, unspecified: Secondary | ICD-10-CM | POA: Diagnosis not present

## 2022-07-14 DIAGNOSIS — R7303 Prediabetes: Secondary | ICD-10-CM

## 2022-07-14 LAB — CBC WITH DIFFERENTIAL/PLATELET
Basophils Absolute: 0 10*3/uL (ref 0.0–0.1)
Basophils Relative: 0.6 % (ref 0.0–3.0)
Eosinophils Absolute: 0.1 10*3/uL (ref 0.0–0.7)
Eosinophils Relative: 1.4 % (ref 0.0–5.0)
HCT: 40.9 % (ref 36.0–46.0)
Hemoglobin: 13.8 g/dL (ref 12.0–15.0)
Lymphocytes Relative: 48 % — ABNORMAL HIGH (ref 12.0–46.0)
Lymphs Abs: 3.4 10*3/uL (ref 0.7–4.0)
MCHC: 33.6 g/dL (ref 30.0–36.0)
MCV: 96.2 fl (ref 78.0–100.0)
Monocytes Absolute: 0.6 10*3/uL (ref 0.1–1.0)
Monocytes Relative: 8.2 % (ref 3.0–12.0)
Neutro Abs: 2.9 10*3/uL (ref 1.4–7.7)
Neutrophils Relative %: 41.8 % — ABNORMAL LOW (ref 43.0–77.0)
Platelets: 360 10*3/uL (ref 150.0–400.0)
RBC: 4.25 Mil/uL (ref 3.87–5.11)
RDW: 13.5 % (ref 11.5–15.5)
WBC: 7.1 10*3/uL (ref 4.0–10.5)

## 2022-07-14 LAB — HEMOGLOBIN A1C: Hgb A1c MFr Bld: 6.3 % (ref 4.6–6.5)

## 2022-07-14 LAB — VITAMIN D 25 HYDROXY (VIT D DEFICIENCY, FRACTURES): VITD: 65.53 ng/mL (ref 30.00–100.00)

## 2022-07-14 LAB — COMPREHENSIVE METABOLIC PANEL
ALT: 15 U/L (ref 0–35)
AST: 20 U/L (ref 0–37)
Albumin: 4.4 g/dL (ref 3.5–5.2)
Alkaline Phosphatase: 31 U/L — ABNORMAL LOW (ref 39–117)
BUN: 21 mg/dL (ref 6–23)
CO2: 25 mEq/L (ref 19–32)
Calcium: 9.3 mg/dL (ref 8.4–10.5)
Chloride: 97 mEq/L (ref 96–112)
Creatinine, Ser: 0.9 mg/dL (ref 0.40–1.20)
GFR: 63.54 mL/min (ref >60.00)
Glucose, Bld: 86 mg/dL (ref 70–99)
Potassium: 5.1 mEq/L (ref 3.5–5.1)
Sodium: 132 mEq/L — ABNORMAL LOW (ref 135–145)
Total Bilirubin: 0.5 mg/dL (ref 0.2–1.2)
Total Protein: 7 g/dL (ref 6.0–8.3)

## 2022-07-21 ENCOUNTER — Encounter: Payer: Self-pay | Admitting: Internal Medicine

## 2022-07-21 ENCOUNTER — Telehealth: Payer: Self-pay | Admitting: Internal Medicine

## 2022-07-21 ENCOUNTER — Ambulatory Visit (INDEPENDENT_AMBULATORY_CARE_PROVIDER_SITE_OTHER): Payer: Medicare PPO | Admitting: Internal Medicine

## 2022-07-21 VITALS — BP 98/60 | HR 87 | Temp 97.8°F | Resp 20 | Ht 64.0 in | Wt 127.0 lb

## 2022-07-21 DIAGNOSIS — E271 Primary adrenocortical insufficiency: Secondary | ICD-10-CM

## 2022-07-21 DIAGNOSIS — Z0000 Encounter for general adult medical examination without abnormal findings: Secondary | ICD-10-CM

## 2022-07-21 DIAGNOSIS — E559 Vitamin D deficiency, unspecified: Secondary | ICD-10-CM | POA: Diagnosis not present

## 2022-07-21 DIAGNOSIS — T733XXA Exhaustion due to excessive exertion, initial encounter: Secondary | ICD-10-CM | POA: Diagnosis not present

## 2022-07-21 DIAGNOSIS — R7303 Prediabetes: Secondary | ICD-10-CM

## 2022-07-21 DIAGNOSIS — N6459 Other signs and symptoms in breast: Secondary | ICD-10-CM | POA: Diagnosis not present

## 2022-07-21 DIAGNOSIS — T63481A Toxic effect of venom of other arthropod, accidental (unintentional), initial encounter: Secondary | ICD-10-CM

## 2022-07-21 DIAGNOSIS — R5383 Other fatigue: Secondary | ICD-10-CM

## 2022-07-21 DIAGNOSIS — E785 Hyperlipidemia, unspecified: Secondary | ICD-10-CM

## 2022-07-21 MED ORDER — SULFAMETHOXAZOLE-TRIMETHOPRIM 800-160 MG PO TABS: 1.0000 | ORAL_TABLET | Freq: Two times a day (BID) | ORAL | 0 refills | Status: DC

## 2022-07-21 MED ORDER — DOXYCYCLINE HYCLATE 100 MG PO TABS: 100.0000 mg | ORAL_TABLET | Freq: Two times a day (BID) | ORAL | 0 refills | Status: DC

## 2022-07-21 NOTE — Telephone Encounter (Signed)
Labs pended for pt to have done before physical next year. Is there anything else that needs to be ordered?

## 2022-07-21 NOTE — Telephone Encounter (Signed)
Pt requesting labs before her physical appt for next year... No labs in system... pt requesting callback

## 2022-07-21 NOTE — Patient Instructions (Addendum)
Septra DS for the wasp bite:  2 times daily with food    Please take a probiotic ( Align, Flora que or [employer]) of the generic version of one of these  For a minimum of 3 weeks to prevent a serious antibiotic associated diarrhea  Called clostridium dificile colitis     I have placed a referral to [facility]  [facility]xy [facility] tick bute  Diagnostic mammogram ordered. [facility] will call you

## 2022-07-21 NOTE — Progress Notes (Unsigned)
Patient ID: Veronica Ferrell, female    DOB: [birth year]  Age: 71 y.o. MRN: [id number]  The patient is here for annual PREVENTIVE  examination and management of other chronic and acute problems.   The risk factors are reflected in the social history.  The roster of all physicians providing medical care to patient - is listed in the Snapshot section of the chart.  Activities of daily living:  The patient is 100% independent in all ADLs: dressing, toileting, feeding as well as independent mobility  Home safety : The patient has smoke detectors in the home. They wear seatbelts.  There are no firearms at home. There is no violence in the home.   There is no risks for hepatitis, STDs or HIV. There is no   history of blood transfusion. They have no travel history to infectious disease endemic areas of the world.  The patient has seen their dentist in the last six month. They have seen their eye doctor in the last year. They admit to slight hearing difficulty with regard to whispered voices and some television programs.  They have deferred audiologic testing in the last year.  They do not  have excessive sun exposure. Discussed the need for sun protection: hats, long sleeves and use of sunscreen if there is significant sun exposure.   Diet: the importance of a healthy diet is discussed. They do have a healthy diet.  The benefits of regular aerobic exercise were discussed. She walks 4 times per week ,  20 minutes.   Depression screen: there are no signs or vegative symptoms of depression- irritability, change in appetite, anhedonia, sadness/tearfullness.  Cognitive assessment: the patient manages all their financial and personal affairs and is actively engaged. They could relate day,date,year and events; recalled 2/3 objects at 3 minutes; performed clock-face test normally.  The following portions of the patient's history were reviewed and updated as appropriate: allergies, current medications, past  family history, past medical history,  past surgical history, past social history  and problem list.  Visual acuity was not assessed per patient preference since she has regular follow up with her ophthalmologist. Hearing and body mass index were assessed and reviewed.   During the course of the visit the patient was educated and counseled about appropriate screening and preventive services including : fall prevention , diabetes screening, nutrition counseling, colorectal cancer screening, and recommended immunizations.    CC: There were no encounter diagnoses.   1) EXERTIONAL FATIGUE : doing strength training,  walking 2 miles daily.  Playing pickle bal   2) persistent wasp bite on right forearm  3) has reduced estradiol tablet to 1/2 daily   has some days with 3-4 hot flashes and some days with none.    4)   History Veronica Ferrell has a past medical history of Addison's disease ([place]), Arthritis, Cancer ([place]), Chicken pox, Colon polyps, Concussion (12/26/2020), Diverticulitis, and GERD (gastroesophageal reflux disease).   She has a past surgical history that includes varicose veins; Appendectomy; Abdominal hysterectomy; Foot surgery (Bilateral); Esophagogastroduodenoscopy (egd) with propofol (N/A, 04/10/2017); Colonoscopy with propofol (N/A, 07/14/2019); and Breast excisional biopsy (Right).   Her family history includes Breast cancer in her maternal aunt, maternal aunt, and maternal grandmother; Breast cancer (age of onset: 79) in her mother; Cancer in her father, maternal aunt, maternal aunt, and maternal grandmother; Cancer (age of onset: 29) in her mother.She reports that she has never smoked. She has never used smokeless tobacco. She reports current alcohol use of about 7.0  standard drinks of alcohol per week. She reports that she does not use drugs.  Outpatient Medications Prior to Visit  Medication Sig Dispense Refill   ALPRAZolam (XANAX) 1 MG tablet TAKE 1 TABLET BY MOUTH AT BEDTIME AS NEEDED  FOR ANXIETY 30 tablet 5   aspirin 81 MG tablet Take 81 mg by mouth once a week.      calcium citrate (CALCITRATE - DOSED IN MG ELEMENTAL CALCIUM) 950 MG tablet Take 1 tablet by mouth daily.     chlorpheniramine-HYDROcodone (TUSSIONEX PENNKINETIC ER) 10-8 MG/5ML Take 5 mLs by mouth at bedtime as needed. 140 mL 0   cholecalciferol (VITAMIN D) 25 MCG (1000 UNIT) tablet Take 3 tablets (3,000 Units total) by mouth daily. 90 tablet 1   diclofenac Sodium (VOLTAREN) 1 % GEL APPLY 2 GRAMS TOPICALLY FOUR TIMES DAILY 100 g 2   estradiol (ESTRACE) 1 MG tablet TAKE ONE TABLET BY MOUTH EVERY DAY 90 tablet 3   fludrocortisone (FLORINEF) 0.1 MG tablet TAKE 1 TABLET BY MOUTH DAILY 90 tablet 1   hydrocortisone (CORTEF) 20 MG tablet TAKE ONE TABLET EACH MORNING AND ONE-HALF TABLET EVERY NIGHT AS DIRECTED 135 tablet 2   ipratropium (ATROVENT) 0.06 % nasal spray USE TWO SPRAYS INTO EACH NOSTRIL FOUR TIMES A DAY 15 mL 2   Lactulose 20 GM/30ML SOLN 30 ml every 4 hours until constipation is relieved 236 mL 3   mirtazapine (REMERON) 7.5 MG tablet TAKE ONE TABLET BY MOUTH AT BEDTIME 90 tablet 1   Multiple Vitamins-Minerals (MULTIVITAMIN WITH MINERALS) tablet Take 1 tablet by mouth daily.     omeprazole (PRILOSEC) 40 MG capsule TAKE 1 CAPSULE BY MOUTH EVERY DAY 90 capsule 1   polyethylene glycol powder (GLYCOLAX/MIRALAX) powder USE AS DIRECTED 527 g 1   promethazine (PHENERGAN) 12.5 MG tablet Take 1 tablet (12.5 mg total) by mouth every 6 (six) hours as needed for nausea or vomiting. 30 tablet 0   triamcinolone cream (KENALOG) 0.1 % Apply 1 application topically 2 (two) times daily. 453.6 g 0   vitamin C (ASCORBIC ACID) 500 MG tablet Take 500 mg by mouth daily.     azithromycin (ZITHROMAX) 500 MG tablet Take 1 tablet (500 mg total) by mouth daily. (Patient not taking: Reported on 07/21/2022) 7 tablet 0   predniSONE (DELTASONE) 10 MG tablet 6 tablets daily for 3 days, then reduce by 1 tablet daily until gone (Patient not  taking: Reported on 06/11/2022) 33 tablet 0   No facility-administered medications prior to visit.    Review of Systems  Patient denies headache, fevers, malaise, unintentional weight loss, skin rash, eye pain, sinus congestion and sinus pain, sore throat, dysphagia,  hemoptysis , cough, dyspnea, wheezing, chest pain, palpitations, orthopnea, edema, abdominal pain, nausea, melena, diarrhea, constipation, flank pain, dysuria, hematuria, urinary  Frequency, nocturia, numbness, tingling, seizures,  Focal weakness, Loss of consciousness,  Tremor, insomnia, depression, anxiety, and suicidal ideation.     Objective:  BP 98/60 (BP Location: Left Arm, Patient Position: Sitting, Cuff Size: Normal)   Pulse 87   Temp 97.8 F (36.6 C) (Oral)   Resp 20   Ht '5\' 4"'$  (1.626 m)   Wt 127 lb (57.6 kg)   SpO2 97%   BMI 21.80 kg/m   Physical Exam   General appearance: alert, cooperative and appears stated age Head: Normocephalic, without obvious abnormality, atraumatic Eyes: conjunctivae/corneas clear. PERRL, EOM's intact. Fundi benign. Ears: normal TM's and external ear canals both ears Nose: Nares normal. Septum midline. Mucosa normal.  No drainage or sinus tenderness. Throat: lips, mucosa, and tongue normal; teeth and gums normal Neck: no adenopathy, no carotid bruit, no JVD, supple, symmetrical, trachea midline and thyroid not enlarged, symmetric, no tenderness/mass/nodules Lungs: clear to auscultation bilaterally Breasts: normal appearance, no masses or tenderness Heart: regular rate and rhythm, S1, S2 normal, no murmur, click, rub or gallop Abdomen: soft, non-tender; bowel sounds normal; no masses,  no organomegaly Extremities: extremities normal, atraumatic, no cyanosis or edema Pulses: 2+ and symmetric Skin: Skin color, texture, turgor normal. No rashes or lesions Neurologic: Alert and oriented X 3, normal strength and tone. Normal symmetric reflexes. Normal coordination and gait.       Assessment & Plan:   Problem List Items Addressed This Visit   None   I am having Doreene Nest "[name]" maintain her aspirin, calcium citrate, multivitamin with minerals, ascorbic acid, polyethylene glycol powder, triamcinolone cream, promethazine, cholecalciferol, fludrocortisone, diclofenac Sodium, Lactulose, omeprazole, estradiol, ALPRAZolam, ipratropium, mirtazapine, hydrocortisone, predniSONE, chlorpheniramine-HYDROcodone, and azithromycin.  No orders of the defined types were placed in this encounter.   There are no discontinued medications.  Follow-up: No follow-ups on file.   Crecencio Mc, MD

## 2022-07-22 ENCOUNTER — Other Ambulatory Visit: Payer: Self-pay | Admitting: Internal Medicine

## 2022-07-22 DIAGNOSIS — N63 Unspecified lump in unspecified breast: Secondary | ICD-10-CM

## 2022-07-22 DIAGNOSIS — T63481A Toxic effect of venom of other arthropod, accidental (unintentional), initial encounter: Secondary | ICD-10-CM | POA: Insufficient documentation

## 2022-07-22 NOTE — Assessment & Plan Note (Signed)
Reviewed prepvious labs,  Fasting glucose.  s he patient feels generally well, is walking several times per week for exercise and weighing weekly .  Following a carbohydrate modified diet 5 days per week. Denies any significant weight change, and has no symptoms suggestive of diabetes (polyuria, polyphagia, polydipsia).   

## 2022-07-22 NOTE — Assessment & Plan Note (Signed)
Right forearm,  Persistent localized reaction despite 2 weeks of topical steroids.  Septra DS prescribed

## 2022-07-22 NOTE — Assessment & Plan Note (Signed)

## 2022-07-22 NOTE — Assessment & Plan Note (Signed)
Exertional, withouth chest pain or dyspnea.  Referring to Dr Rockey Situ for noninvasive testing

## 2022-07-22 NOTE — Assessment & Plan Note (Signed)
Current level has been maintained with supplements/

## 2022-07-22 NOTE — Assessment & Plan Note (Signed)
She has been experiencing extreme  Fatigue episodes following her exercise without chest pain or dyspnea.  Her symptoms are likely due to Addison's and age,  But she has not a cardiology evaluation in several years referring to Dr Rockey Situ for noninvasive testing. Continue current steroids  Lab Results  Component Value Date   NA 132 (L) 07/14/2022   K 5.1 07/14/2022   CL 97 07/14/2022   CO2 25 07/14/2022   Lab Results  Component Value Date   CREATININE 0.90 07/14/2022

## 2022-07-22 NOTE — Assessment & Plan Note (Signed)
Right breast feels more firm than left in the central area underlying the nipple.  Diagnostic mammomgram ordered

## 2022-07-29 ENCOUNTER — Other Ambulatory Visit: Payer: Self-pay | Admitting: Internal Medicine

## 2022-07-30 ENCOUNTER — Ambulatory Visit (INDEPENDENT_AMBULATORY_CARE_PROVIDER_SITE_OTHER): Payer: Medicare PPO

## 2022-07-30 VITALS — BP 93/60 | HR 77 | Ht 64.0 in | Wt 127.0 lb

## 2022-07-30 DIAGNOSIS — Z0000 Encounter for general adult medical examination without abnormal findings: Secondary | ICD-10-CM | POA: Diagnosis not present

## 2022-07-30 NOTE — Patient Instructions (Addendum)
  Veronica Ferrell , Thank you for taking time to come for your Medicare Wellness Visit. I appreciate your ongoing commitment to your health goals. Please review the following plan we discussed and let me know if I can assist you in the future.   These are the goals we discussed:  Goals       Patient Stated     Maintain Healthy Lifestyle (pt-stated)      Healthy diet. Stay active. Follow up with pcp as needed.        This is a list of the screening recommended for you and due dates:  Health Maintenance  Topic Date Due   Flu Shot  07/22/2022   COVID-19 Vaccine (4 - Booster for Pfizer series) 08/15/2022*   Mammogram  02/20/2023   Colon Cancer Screening  07/13/2024   Tetanus Vaccine  03/17/2032   Pneumonia Vaccine  Completed   DEXA scan (bone density measurement)  Completed   Hepatitis C Screening: USPSTF Recommendation to screen - Ages 86-79 yo.  Completed   Zoster (Shingles) Vaccine  Completed   HPV Vaccine  Aged Out  *Topic was postponed. The date shown is not the original due date.

## 2022-07-30 NOTE — Progress Notes (Addendum)
Subjective:   Veronica Ferrell is a 71 y.o. female who presents for Medicare Annual (Subsequent) preventive examination.  Review of Systems    No ROS.  Medicare Wellness Virtual Visit.  Visual/audio telehealth visit, UTA vital signs.   See social history for additional risk factors.   Cardiac Risk Factors include: advanced age (>59mn, >>57women)     Objective:    Today's Vitals   07/30/22 1504  BP: 93/60  Pulse: 77  Weight: 127 lb (57.6 kg)  Height: '5\' 4"'$  (1.626 m)   Body mass index is 21.8 kg/m.     07/30/2022    3:09 PM 07/11/2021    1:04 PM 07/10/2020    9:36 AM 07/14/2019    8:05 AM 06/20/2019   11:25 AM 07/30/2018    8:46 AM 06/17/2018    3:16 PM  Advanced Directives  Does Patient Have a Medical Advance Directive? Yes Yes Yes Yes Yes No Yes  Type of AParamedicof AAvenue B and CLiving will HQuinnesecLiving will HGuymonLiving will Living will HHickmanLiving will  HSaybrook ManorLiving will  Does patient want to make changes to medical advance directive? No - Patient declined No - Patient declined No - Patient declined  No - Patient declined  No - Patient declined  Copy of HYoloin Chart? No - copy requested No - copy requested No - copy requested  No - copy requested  No - copy requested    Current Medications (verified) Outpatient Encounter Medications as of 07/30/2022  Medication Sig   ALPRAZolam (XANAX) 1 MG tablet TAKE 1 TABLET BY MOUTH AT BEDTIME AS NEEDED FOR ANXIETY   aspirin 81 MG tablet Take 81 mg by mouth once a week.    azithromycin (ZITHROMAX) 500 MG tablet Take 1 tablet (500 mg total) by mouth daily. (Patient not taking: Reported on 07/21/2022)   calcium citrate (CALCITRATE - DOSED IN MG ELEMENTAL CALCIUM) 950 MG tablet Take 1 tablet by mouth daily.   chlorpheniramine-HYDROcodone (TUSSIONEX PENNKINETIC ER) 10-8 MG/5ML Take 5 mLs by mouth at  bedtime as needed.   cholecalciferol (VITAMIN D) 25 MCG (1000 UNIT) tablet Take 3 tablets (3,000 Units total) by mouth daily.   diclofenac Sodium (VOLTAREN) 1 % GEL APPLY 2 GRAMS TOPICALLY FOUR TIMES DAILY   doxycycline (VIBRA-TABS) 100 MG tablet Take 1 tablet (100 mg total) by mouth 2 (two) times daily.   estradiol (ESTRACE) 1 MG tablet TAKE ONE TABLET BY MOUTH EVERY DAY   fludrocortisone (FLORINEF) 0.1 MG tablet TAKE 1 TABLET BY MOUTH DAILY   hydrocortisone (CORTEF) 20 MG tablet TAKE ONE TABLET EACH MORNING AND ONE-HALF TABLET EVERY NIGHT AS DIRECTED   ipratropium (ATROVENT) 0.06 % nasal spray USE TWO SPRAYS INTO EACH NOSTRIL FOUR TIMES A DAY   Lactulose 20 GM/30ML SOLN 30 ml every 4 hours until constipation is relieved   mirtazapine (REMERON) 7.5 MG tablet TAKE ONE TABLET BY MOUTH AT BEDTIME   Multiple Vitamins-Minerals (MULTIVITAMIN WITH MINERALS) tablet Take 1 tablet by mouth daily.   omeprazole (PRILOSEC) 40 MG capsule TAKE 1 CAPSULE BY MOUTH EVERY DAY   polyethylene glycol powder (GLYCOLAX/MIRALAX) powder USE AS DIRECTED   predniSONE (DELTASONE) 10 MG tablet 6 tablets daily for 3 days, then reduce by 1 tablet daily until gone (Patient not taking: Reported on 06/11/2022)   promethazine (PHENERGAN) 12.5 MG tablet Take 1 tablet (12.5 mg total) by mouth every 6 (six) hours as  needed for nausea or vomiting.   sulfamethoxazole-trimethoprim (BACTRIM DS) 800-160 MG tablet Take 1 tablet by mouth 2 (two) times daily.   triamcinolone cream (KENALOG) 0.1 % Apply 1 application topically 2 (two) times daily.   vitamin C (ASCORBIC ACID) 500 MG tablet Take 500 mg by mouth daily.   No facility-administered encounter medications on file as of 07/30/2022.    Allergies (verified) Alendronate sodium, Boniva [ibandronic acid], Clindamycin/lincomycin, Tramadol, and Penicillins   History: Past Medical History:  Diagnosis Date   Addison's disease ([place])    Arthritis    fingers   Cancer ([place])    skin    Chicken pox    Colon polyps    Concussion 12/26/2020   Diverticulitis    GERD (gastroesophageal reflux disease)    Past Surgical History:  Procedure Laterality Date   ABDOMINAL HYSTERECTOMY     APPENDECTOMY     BREAST EXCISIONAL BIOPSY Right    2010   COLONOSCOPY WITH PROPOFOL N/A 07/14/2019   Procedure: COLONOSCOPY WITH PROPOFOL;  Surgeon: Lucilla Lame, MD;  Location: [facility] ENDOSCOPY;  Service: Endoscopy;  Laterality: N/A;   ESOPHAGOGASTRODUODENOSCOPY (EGD) WITH PROPOFOL N/A 04/10/2017   Procedure: ESOPHAGOGASTRODUODENOSCOPY (EGD) WITH PROPOFOL;  Surgeon: Lucilla Lame, MD;  Location: [place];  Service: Endoscopy;  Laterality: N/A;  requeests early as possible   FOOT SURGERY Bilateral    varicose veins     Family History  Problem Relation Age of Onset   Cancer Mother 40       Breast Ca  colon Ca (58)  and brain Ca (58)   Breast cancer Mother 66   Cancer Father    Cancer Maternal Aunt        breast   Breast cancer Maternal Aunt    Cancer Maternal Grandmother        breast   Breast cancer Maternal Grandmother    Cancer Maternal Aunt        breast ca   Breast cancer Maternal Aunt    Social History   Socioeconomic History   Marital status: Married    Spouse name: Not on file   Number of children: Not on file   Years of education: Not on file   Highest education level: Not on file  Occupational History   Not on file  Tobacco Use   Smoking status: Never   Smokeless tobacco: Never  Vaping Use   Vaping Use: Never used  Substance and Sexual Activity   Alcohol use: Yes    Alcohol/week: 7.0 standard drinks of alcohol    Types: 7 Glasses of wine per week    Comment: WINE EACH WEEK   Drug use: No   Sexual activity: Yes  Other Topics Concern   Not on file  Social History Narrative   Not on file   Social Determinants of Health   Financial Resource Strain: Low Risk  (07/30/2022)   Overall Financial Resource Strain (CARDIA)    Difficulty of Paying Living  Expenses: Not hard at all  Food Insecurity: No Food Insecurity (07/30/2022)   Hunger Vital Sign    Worried About Running Out of Food in the Last Year: Never true    Ran Out of Food in the Last Year: Never true  Transportation Needs: No Transportation Needs (07/30/2022)   PRAPARE - Hydrologist (Medical): No    Lack of Transportation (Non-Medical): No  Physical Activity: Sufficiently Active (07/30/2022)   Exercise Vital Sign    Days  of Exercise per Week: 5 days    Minutes of Exercise per Session: 60 min  Stress: No Stress Concern Present (07/30/2022)   [place]    Feeling of Stress : Not at all  Social Connections: Unknown (07/30/2022)   Social Connection and Isolation Panel [NHANES]    Frequency of Communication with Friends and Family: Not on file    Frequency of Social Gatherings with Friends and Family: Not on file    Attends Religious Services: Not on file    Active Member of Clubs or Organizations: Not on file    Attends Archivist Meetings: Not on file    Marital Status: Married    Tobacco Counseling Counseling given: Not Answered  Clinical Intake: Pre-visit preparation completed: Yes       Diabetes: No  How often do you need to have someone help you when you read instructions, pamphlets, or other written materials from your doctor or pharmacy?: 1 - Never  Interpreter Needed?: No    Activities of Daily Living    07/30/2022    3:09 PM  In your present state of health, do you have any difficulty performing the following activities:  Hearing? 0  Vision? 0  Difficulty concentrating or making decisions? 0  Walking or climbing stairs? 0  Dressing or bathing? 0  Doing errands, shopping? 0  Preparing Food and eating ? N  Using the Toilet? N  In the past six months, have you accidently leaked urine? N  Do you have problems with loss of bowel control? N  Managing your  Medications? N  Managing your Finances? N  Housekeeping or managing your Housekeeping? N   Patient Care Team: Crecencio Mc, MD as PCP - General (Internal Medicine)  Indicate any recent Medical Services you may have received from other than [facility] providers in the past year (date may be approximate).     Assessment:   This is a routine wellness examination for [facility].  Virtual Visit via Telephone Note  I connected with  Doreene Nest on 07/30/22 at  3:00 PM EDT by telephone and verified that I am speaking with the correct person using two identifiers.  Location: Patient: home Provider: office Persons participating in the virtual visit: patient/Nurse Health Advisor   I discussed the limitations of performing an evaluation and management service by telehealth. We continued and completed visit with audio only. Some vital signs may be absent or patient reported.   Hearing/Vision screen Hearing Screening - Comments:: Patient is able to hear conversational tones without difficulty.  No issues reported. Vision Screening - Comments:: Followed by [facility], Dr. Mali Brasington Wears readers   They have regular follow up with the ophthalmologist  Dietary issues and exercise activities discussed: Current Exercise Habits: Home exercise routine, Type of exercise: walking;strength training/weights [place]), Intensity: Moderate Healthy diet Good water intake   Goals Addressed               This Visit's Progress     Patient Stated     Maintain Healthy Lifestyle (pt-stated)        Healthy diet. Stay active. Follow up with pcp as needed.       Depression Screen    07/30/2022    3:31 PM 07/21/2022    1:23 PM 05/26/2022    4:33 PM 04/23/2022   10:28 AM 01/30/2022    9:29 AM 08/02/2021    8:36 AM 07/18/2021  9:24 AM  PHQ 2/9 Scores  PHQ - 2 Score 0 0 0 0 0 0 0    Fall Risk    07/30/2022    3:32 PM 07/21/2022    1:23 PM 05/26/2022    4:33 PM 04/23/2022   10:28 AM  01/30/2022    9:29 AM  Fall Risk   Falls in the past year? 0 0 0 0 0  Number falls in past yr: 0   0   Injury with Fall?    0   Risk for fall due to :  No Fall Risks No Fall Risks No Fall Risks No Fall Risks  Follow up Falls evaluation completed Falls evaluation completed Falls evaluation completed Falls evaluation completed Falls evaluation completed    [place]: Home free of loose throw rugs in walkways, pet beds, electrical cords, etc? Yes  Adequate lighting in your home to reduce risk of falls? Yes   ASSISTIVE DEVICES UTILIZED TO PREVENT FALLS: Life alert? No  Use of a cane, walker or w/c? No   TIMED UP AND GO: Was the test performed? No .   Cognitive Function: Patient is alert and oriented x3.  Enjoys reading and playing brain health stimulating games/activities like Wordle.      07/10/2020    9:41 AM 06/17/2018    3:35 PM  MMSE - Mini Mental State Exam  Not completed: Unable to complete   Orientation to time  5  Orientation to Place  5  Registration  3  Attention/ Calculation  5  Recall  3  Language- name 2 objects  2  Language- repeat  1  Language- follow 3 step command  3  Language- read & follow direction  1  Write a sentence  1  Copy design  1  Total score  30        06/20/2019   11:27 AM  6CIT Screen  What Year? 0 points  What month? 0 points  What time? 0 points  Count back from 20 0 points  Months in reverse 0 points  Repeat phrase 0 points  Total Score 0 points    Immunizations Immunization History  Administered Date(s) Administered   Fluad Quad(high Dose 65+) 09/16/2019   Influenza Split 09/21/2013, 09/19/2015, 08/30/2017   Influenza, High Dose Seasonal PF 10/08/2020   Influenza,inj,Quad PF,6+ Mos 09/11/2014   Influenza-Unspecified 08/22/2018, 09/19/2021   PFIZER(Purple Top)SARS-COV-2 Vaccination 01/13/2020, 02/03/2020, 09/23/2020   Pneumococcal Conjugate-13 09/11/2014   Pneumococcal Polysaccharide-23  03/08/2012, 03/30/2018   Tdap 03/09/2011, 03/08/2012, 03/17/2022   Zoster Recombinat (Shingrix) 08/24/2018, 11/19/2018   Zoster, Live 03/08/2012   Screening Tests Health Maintenance  Topic Date Due   INFLUENZA VACCINE  07/22/2022   COVID-19 Vaccine (4 - Booster for Pfizer series) 08/15/2022 (Originally 11/18/2020)   MAMMOGRAM  02/20/2023   COLONOSCOPY (Pts 45-45yr Insurance coverage will need to be confirmed)  07/13/2024   TETANUS/TDAP  03/17/2032   Pneumonia Vaccine 71 Years old  Completed   DEXA SCAN  Completed   Hepatitis C Screening  Completed   Zoster Vaccines- Shingrix  Completed   HPV VACCINES  Aged Out   Health Maintenance Health Maintenance Due  Topic Date Due   INFLUENZA VACCINE  07/22/2022   Lung Cancer Screening: (Low Dose CT Chest recommended if Age 73-80years, 30 pack-year currently smoking OR have quit w/in 15years.) does not qualify.   Vision Screening: Recommended annual ophthalmology exams for early detection of glaucoma and other disorders  of the eye.  Dental Screening: Recommended annual dental exams for proper oral hygiene  Community Resource Referral / Chronic Care Management: CRR required this visit?  No   CCM required this visit?  No      Plan:   Keep all routine maintenance appointments.   I have personally reviewed and noted the following in the patient's chart:   Medical and social history Use of alcohol, tobacco or illicit drugs  Current medications and supplements including opioid prescriptions.  Functional ability and status Nutritional status Physical activity Advanced directives List of other physicians Hospitalizations, surgeries, and ER visits in previous 12 months Vitals Screenings to include cognitive, depression, and falls Referrals and appointments  In addition, I have reviewed and discussed with patient certain preventive protocols, quality metrics, and best practice recommendations. A written personalized care plan for  preventive services as well as general preventive health recommendations were provided to patient.     OBrien-Blaney, Donnamarie Shankles L, LPN   4/0/9811      I have reviewed the above information and agree with above.   Deborra Medina, MD

## 2022-08-08 ENCOUNTER — Ambulatory Visit
Admission: RE | Admit: 2022-08-08 | Discharge: 2022-08-08 | Disposition: A | Discharge status: Home / Self Care | Payer: Medicare PPO | Source: Ambulatory Visit | Attending: Internal Medicine | Admitting: Internal Medicine

## 2022-08-08 DIAGNOSIS — N6310 Unspecified lump in the right breast, unspecified quadrant: Secondary | ICD-10-CM | POA: Insufficient documentation

## 2022-08-08 DIAGNOSIS — N63 Unspecified lump in unspecified breast: Secondary | ICD-10-CM | POA: Insufficient documentation

## 2022-08-08 DIAGNOSIS — N6489 Other specified disorders of breast: Secondary | ICD-10-CM | POA: Diagnosis not present

## 2022-08-08 DIAGNOSIS — N6459 Other signs and symptoms in breast: Secondary | ICD-10-CM

## 2022-08-08 DIAGNOSIS — R922 Inconclusive mammogram: Secondary | ICD-10-CM | POA: Diagnosis not present

## 2022-08-19 ENCOUNTER — Other Ambulatory Visit: Payer: Self-pay | Admitting: Internal Medicine

## 2022-09-20 NOTE — Progress Notes (Unsigned)
Cardiology Office Note  Date:  09/20/2022   ID:  Veronica Ferrell, Veronica Ferrell [birth year], MRN [id number]  PCP:  Crecencio Mc, MD   No chief complaint on file.   HPI:  [name] is a 71 year old woman with history of  Addison's disease, maintained on cortisone and Florinef (previous hyponatremia and hyperkalemia) chronic insomnia, Previously seen by cardiology in 2015 for transient amnesia. Aorta atherosclerosis on CT scan 2019 Who presents by referral from Dr. Derrel Nip for consultation of her exertional fatigue    She reports that on 02/12/2014  she flew into [place]. She had 2 glasses of wine, that evening took one quarter pill Valium. In the morning she woke with headache at 6 AM, had coffee. That morning also had sexual relations with her husband. Shortly after had a shower. Coming out of the shower she had acute onset of amnesia. She denied any balance issues, vision issues, no neurologic problems. Her amnesia persisted for many hours. She repeated the same questions over and over. She had taken her regular morning pills but asked her husband 28 times if she had taken her morning pills.   She presented to the emergency room where she had CT scan of the head, MRI, EEG. Testing was essentially normal. No carotid stenoses noted. She was placed on telemetry per the patient with notes indicating no arrhythmia.  She was released. Symptoms seemed to resolve 4-5 hours after initial presentation She does report having a problem with jet lag in the past but typically has upset stomach. Has never had amnesia before Total cholesterol 194, HDL 100, LDL 81  EKG shows normal sinus rhythm with rate 90 beats per minute, no significant ST or T wave changes No echocardiogram done on her hospital admission. No cold or ordered  PMH:   has a past medical history of Addison's disease ([place]), Arthritis, Cancer ([place]), Chicken pox, Colon polyps, Concussion (12/26/2020), Diverticulitis, and GERD  (gastroesophageal reflux disease).  PSH:    Past Surgical History:  Procedure Laterality Date   ABDOMINAL HYSTERECTOMY     APPENDECTOMY     BREAST EXCISIONAL BIOPSY Right    2010   COLONOSCOPY WITH PROPOFOL N/A 07/14/2019   Procedure: COLONOSCOPY WITH PROPOFOL;  Surgeon: Lucilla Lame, MD;  Location[facility] END[facility]COPY;  Service: Endoscopy;  Laterality: N/A;   ESOPHAGOGASTRODUODENOSCOPY (EGD) WITH PROPOFOL N/A 04/10/2017   Procedure: ESOPHAGOGASTRODUODENOSCOPY (EGD) WITH PROPOFOL;  Surgeon: Lucilla Lame, MD;  Location: [place];  Service: Endoscopy;  Laterality: N/A;  requeests early as possible   FOOT SURGERY Bilateral    varicose veins      Current Outpatient Medications  Medication Sig Dispense Refill   ALPRAZolam (XANAX) 1 MG tablet TAKE 1 TABLET BY MOUTH AT BEDTIME AS NEEDED FOR ANXIETY 30 tablet 5   aspirin 81 MG tablet Take 81 mg by mouth once a week.      azithromycin (ZITHROMAX) 500 MG tablet Take 1 tablet (500 mg total) by mouth daily. (Patient not taking: Reported on 07/21/2022) 7 tablet 0   calcium citrate (CALCITRATE - DOSED IN MG ELEMENTAL CALCIUM) 950 MG tablet Take 1 tablet by mouth daily.     chlorpheniramine-HYDROcodone (TUSSIONEX PENNKINETIC ER) 10-8 MG/5ML Take 5 mLs by mouth at bedtime as needed. 140 mL 0   cholecalciferol (VITAMIN D) 25 MCG (1000 UNIT) tablet Take 3 tablets (3,000 Units total) by mouth daily. 90 tablet 1   diclofenac Sodium (VOLTAREN) 1 % GEL APPLY 2 GRAMS TOPICALLY FOUR TIMES DAILY 100 g  2   doxycycline (VIBRA-TABS) 100 MG tablet Take 1 tablet (100 mg total) by mouth 2 (two) times daily. 20 tablet 0   estradiol (ESTRACE) 1 MG tablet TAKE ONE TABLET BY MOUTH EVERY DAY 90 tablet 3   fludrocortisone (FLORINEF) 0.1 MG tablet TAKE 1 TABLET BY MOUTH DAILY 90 tablet 1   hydrocortisone (CORTEF) 20 MG tablet TAKE ONE TABLET EACH MORNING AND ONE-HALF TABLET EVERY NIGHT AS DIRECTED 135 tablet 2   ipratropium (ATROVENT) 0.06 % nasal spray USE TWO SPRAYS  INTO EACH NOSTRIL FOUR TIMES A DAY 15 mL 2   Lactulose 20 GM/30ML SOLN 30 ml every 4 hours until constipation is relieved 236 mL 3   mirtazapine (REMERON) 7.5 MG tablet TAKE ONE TABLET BY MOUTH AT BEDTIME 90 tablet 1   Multiple Vitamins-Minerals (MULTIVITAMIN WITH MINERALS) tablet Take 1 tablet by mouth daily.     omeprazole (PRILOSEC) 40 MG capsule TAKE 1 CAPSULE BY MOUTH EVERY DAY 90 capsule 1   polyethylene glycol powder (GLYCOLAX/MIRALAX) powder USE AS DIRECTED 527 g 1   predniSONE (DELTASONE) 10 MG tablet 6 tablets daily for 3 days, then reduce by 1 tablet daily until gone (Patient not taking: Reported on 06/11/2022) 33 tablet 0   promethazine (PHENERGAN) 12.5 MG tablet Take 1 tablet (12.5 mg total) by mouth every 6 (six) hours as needed for nausea or vomiting. 30 tablet 0   sulfamethoxazole-trimethoprim (BACTRIM DS) 800-160 MG tablet Take 1 tablet by mouth 2 (two) times daily. 14 tablet 0   triamcinolone cream (KENALOG) 0.1 % Apply 1 application topically 2 (two) times daily. 453.6 g 0   vitamin C (ASCORBIC ACID) 500 MG tablet Take 500 mg by mouth daily.     No current facility-administered medications for this visit.     Allergies:   Alendronate sodium, Boniva [ibandronic acid], Clindamycin/lincomycin, Tramadol, and Penicillins   Social History:  The patient  reports that she has never smoked. She has never used smokeless tobacco. She reports current alcohol use of about 7.0 standard drinks of alcohol per week. She reports that she does not use drugs.   Family History:   family history includes Breast cancer in her maternal aunt, maternal aunt, and maternal grandmother; Breast cancer (age of onset: 27) in her mother; Cancer in her father, maternal aunt, maternal aunt, and maternal grandmother; Cancer (age of onset: 77) in her mother.    Review of Systems: ROS   PHYSICAL EXAM: VS:  There were no vitals taken for this visit. , BMI There is no height or weight on file to calculate  BMI. GEN: Well nourished, well developed, in no acute distress HEENT: normal Neck: no JVD, carotid bruits, or masses Cardiac: RRR; no murmurs, rubs, or gallops,no edema  Respiratory:  clear to auscultation bilaterally, normal work of breathing GI: soft, nontender, nondistended, + BS MS: no deformity or atrophy Skin: warm and dry, no rash Neuro:  Strength and sensation are intact Psych: euthymic mood, full affect    Recent Labs: 07/14/2022: ALT 15; BUN 21; Creatinine, Ser 0.90; Hemoglobin 13.8; Platelets 360.0; Potassium 5.1; Sodium 132    Lipid Panel Lab Results  Component Value Date   CHOL 230 (H) 07/16/2021   HDL 112.90 07/16/2021   LDLCALC 99 07/16/2021   TRIG 90.0 07/16/2021      Wt Readings from Last 3 Encounters:  07/30/22 127 lb (57.6 kg)  07/21/22 127 lb (57.6 kg)  06/11/22 123 lb (55.8 kg)       ASSESSMENT AND  PLAN:  Problem List Items Addressed This Visit   None    Disposition:   F/U  12 months   Total encounter time more than 30 minutes  Greater than 50% was spent in counseling and coordination of care with the patient    Signed, Esmond Plants, M.D., Ph.D. [place], [place]

## 2022-09-22 ENCOUNTER — Encounter: Payer: Self-pay | Admitting: Cardiovascular Disease

## 2022-09-22 ENCOUNTER — Ambulatory Visit: Payer: Medicare PPO | Attending: Cardiovascular Disease | Admitting: Cardiovascular Disease

## 2022-09-22 ENCOUNTER — Other Ambulatory Visit: Payer: Self-pay | Admitting: Internal Medicine

## 2022-09-22 VITALS — BP 90/60 | HR 76 | Ht 64.0 in | Wt 124.6 lb

## 2022-09-22 DIAGNOSIS — R0602 Shortness of breath: Secondary | ICD-10-CM | POA: Diagnosis not present

## 2022-09-22 DIAGNOSIS — R5383 Other fatigue: Secondary | ICD-10-CM

## 2022-09-22 DIAGNOSIS — I209 Angina pectoris, unspecified: Secondary | ICD-10-CM | POA: Diagnosis not present

## 2022-09-22 DIAGNOSIS — K219 Gastro-esophageal reflux disease without esophagitis: Secondary | ICD-10-CM

## 2022-09-22 DIAGNOSIS — I700 Atherosclerosis of aorta: Secondary | ICD-10-CM

## 2022-09-22 MED ORDER — METOPROLOL TARTRATE 25 MG PO TABS: ORAL_TABLET | ORAL | 0 refills | Status: DC

## 2022-09-22 MED ORDER — ROSUVASTATIN CALCIUM 5 MG PO TABS: 5.0000 mg | ORAL_TABLET | Freq: Every day | ORAL | 5 refills | Status: DC

## 2022-09-22 NOTE — Patient Instructions (Addendum)
Cardiac CTA for angina/fatigue TAKE METOPROLOL 25 MG 2 HOURS PRIOR TO CT TAKE IVABRADINE 5 MG 2 TABLETS 2 HOURS PRIOR TO CT  DRINK PLENTY OF FLUIDS PRIOR   Medication Instructions:  Crestor 5 mg daily  Ask Dr. Derrel Nip about midodrine for higher blood pressure  If you need a refill on your cardiac medications before your next appointment, please call your pharmacy.   Lab work: BMET A FEW DAYS PRIOR TO CT  MEDICAL MALL ENTRANCE (BUILDING NEXT DOOR)   Testing/Procedures: No new testing needed  Follow-Up: At [facility], you and your health needs are our priority.  As part of our continuing mission to provide you with exceptional heart care, we have created designated Provider Care Teams.  These Care Teams include your primary Cardiologist (physician) and Advanced Practice Providers (APPs -  Physician Assistants and Nurse Practitioners) who all work together to provide you with the care you need, when you need it.  You will need a follow up appointment as needed  Providers on your designated Care Team:   Murray Hodgkins, NP Christell Faith, PA-C Cadence Kathlen Mody, [place]  COVID-19 Vaccine Information can be found at: [email or web address].uk For questions related to vaccine distribution or appointments, please email [email or web address] or call [phone].     Your cardiac CT will be scheduled at one of the below locations:   [facility] [place], [place] [address] [phone]  If scheduled at [facility], please arrive at the [facility] and Children's Entrance (Entrance C2) of [facility] 30 minutes prior to test start time. You can use the FREE valet parking offered at entrance C (encouraged to control the heart rate for the test)  Proceed to the [facility] Radiology Department (first floor) to check-in and test prep.  All radiology patients and guests should use entrance C2  at [facility], accessed from [facility], even though the hospital's physical address listed is [address]   If scheduled at [facility] or [facility], please arrive 15 mins early for check-in and test prep.  Please follow these instructions carefully (unless otherwise directed):   Hold all erectile dysfunction medications at least 3 days (72 hrs) prior to test. (Ie viagra, cialis, sildenafil, tadalafil, etc) We will administer nitroglycerin during this exam.  On the Night Before the Test: Be sure to Drink plenty of water. Do not consume any caffeinated/decaffeinated beverages or chocolate 12 hours prior to your test. Do not take any antihistamines 12 hours prior to your test.  On the Day of the Test: Drink plenty of water until 1 hour prior to the test. Do not eat any food 1 hour prior to test. You may take your regular medications prior to the test.  Take metoprolol (Lopressor) two hours prior to test. HOLD Furosemide/Hydrochlorothiazide morning of the test. FEMALES- please wear underwire-free bra if available, avoid dresses & tight clothing  After the Test: Drink plenty of water. After receiving IV contrast, you may experience a mild flushed feeling. This is normal. On occasion, you may experience a mild rash up to 24 hours after the test. This is not dangerous. If this occurs, you can take Benadryl 25 mg and increase your fluid intake. If you experience trouble breathing, this can be serious. If it is severe call 911 IMMEDIATELY. If it is mild, please call our office. If you take any of these medications: Glipizide/Metformin, Avandament, Glucavance, please do not take 48  hours after completing test unless otherwise instructed.  We will call to schedule your test 2-4 weeks out understanding that some insurance companies will need an authorization prior to the service being performed.   For  non-scheduling related questions, please contact the cardiac imaging nurse navigator should you have any questions/concerns: Marchia Bond, Cardiac Imaging Nurse Navigator Gordy Clement, Cardiac Imaging Nurse Navigator [facility] Direct Office Dial: [phone]   For scheduling needs, including cancellations and rescheduling, please call [place], [phone].

## 2022-10-02 ENCOUNTER — Other Ambulatory Visit
Admission: RE | Admit: 2022-10-02 | Discharge: 2022-10-02 | Disposition: A | Discharge status: Home / Self Care | Payer: Medicare PPO | Source: Ambulatory Visit | Attending: Cardiovascular Disease | Admitting: Cardiovascular Disease

## 2022-10-02 DIAGNOSIS — R0602 Shortness of breath: Secondary | ICD-10-CM | POA: Diagnosis not present

## 2022-10-02 DIAGNOSIS — I209 Angina pectoris, unspecified: Secondary | ICD-10-CM

## 2022-10-02 DIAGNOSIS — R5383 Other fatigue: Secondary | ICD-10-CM | POA: Diagnosis not present

## 2022-10-02 LAB — BASIC METABOLIC PANEL
Anion gap: 6 (ref 5–15)
BUN: 16 mg/dL (ref 8–23)
CO2: 28 mmol/L (ref 22–32)
Calcium: 9.8 mg/dL (ref 8.9–10.3)
Chloride: 101 mmol/L (ref 98–111)
Creatinine, Ser: 0.81 mg/dL (ref 0.44–1.00)
GFR, Estimated: >60 mL/min (ref >60)
Glucose, Bld: 95 mg/dL (ref 70–99)
Potassium: 4.9 mmol/L (ref 3.5–5.1)
Sodium: 135 mmol/L (ref 135–145)

## 2022-10-08 ENCOUNTER — Telehealth (HOSPITAL_COMMUNITY): Payer: Self-pay | Admitting: Emergency Medicine

## 2022-10-08 NOTE — Telephone Encounter (Signed)
Reaching out to patient to offer assistance regarding upcoming cardiac imaging study; pt verbalizes understanding of appt date/time, parking situation and where to check in, pre-test NPO status and medications ordered, and verified current allergies; name and call back number provided for further questions should they arise Marchia Bond RN Navigator Cardiac Imaging [facility] [phone] office [phone] cell   Arrival 830, [facility]IC '25mg'$  metoprolol + '10mg'$  ivabradine PO hydration

## 2022-10-08 NOTE — Telephone Encounter (Signed)
Attempted to call patient regarding upcoming cardiac CT appointment. °Left message on voicemail with name and callback number °Ivo Moga RN Navigator Cardiac Imaging °[facility] °[phone] Office °[phone] Cell ° °

## 2022-10-09 ENCOUNTER — Ambulatory Visit
Admission: RE | Admit: 2022-10-09 | Discharge: 2022-10-09 | Disposition: A | Discharge status: Home / Self Care | Payer: Medicare PPO | Source: Ambulatory Visit | Attending: Cardiovascular Disease | Admitting: Cardiovascular Disease

## 2022-10-09 DIAGNOSIS — R5383 Other fatigue: Secondary | ICD-10-CM | POA: Diagnosis not present

## 2022-10-09 DIAGNOSIS — I209 Angina pectoris, unspecified: Secondary | ICD-10-CM | POA: Diagnosis not present

## 2022-10-09 DIAGNOSIS — R0602 Shortness of breath: Secondary | ICD-10-CM | POA: Diagnosis not present

## 2022-10-09 MED ORDER — NITROGLYCERIN 0.4 MG SL SUBL
0.4000 mg | SUBLINGUAL_TABLET | Freq: Once | SUBLINGUAL | Status: AC
  Administered 2022-10-09: 0.4 mg via SUBLINGUAL

## 2022-10-09 MED ORDER — IOHEXOL 350 MG/ML SOLN
75.0000 mL | Freq: Once | INTRAVENOUS | Status: AC | PRN
Start: 2022-10-09 — End: 2022-10-09
  Administered 2022-10-09: 75 mL via INTRAVENOUS

## 2022-10-09 MED ORDER — METOPROLOL TARTRATE 5 MG/5ML IV SOLN
5.0000 mg | Freq: Once | INTRAVENOUS | Status: AC
  Administered 2022-10-09: 5 mg via INTRAVENOUS

## 2022-10-09 NOTE — Progress Notes (Signed)
Patient tolerated CT well. Drank water and coffee after. Vital signs stable encourage to drink water throughout day.Reasons explained and verbalized understanding. Ambulated steady gait in hallway.

## 2022-10-10 ENCOUNTER — Telehealth: Payer: Self-pay | Admitting: Cardiovascular Disease

## 2022-10-10 NOTE — Telephone Encounter (Signed)
IMPRESSION: 1.  No acute findings in the imaged extracardiac chest. 2.  Tiny hiatal hernia. 3.  Aortic Atherosclerosis (ICD10-I70.0). 4. 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 Favor nerve sheath tumor. If more definitive characterization is desired, consider pre and postcontrast thoracic spine MRI.   These results will be called to the ordering clinician or representative by the Radiologist Assistant, and communication documented in the PACS or [employer].     Electronically Signed   By: Abigail Miyamoto M.D.   On: 10/10/2022 08:48   Sent to provider

## 2022-10-10 NOTE — Telephone Encounter (Signed)
[facility] radiology calling with an over read.

## 2022-10-13 ENCOUNTER — Telehealth: Payer: Self-pay | Admitting: Internal Medicine

## 2022-10-13 ENCOUNTER — Encounter: Payer: Self-pay | Admitting: Internal Medicine

## 2022-10-13 NOTE — Telephone Encounter (Signed)
Patient has an appointment in a few months with Dr Derrel Nip and wanted to know if she needed to do labs.

## 2022-10-15 NOTE — Telephone Encounter (Signed)
Labs have been ordered and pt has been scheduled for a fasting lab appt. Pt is aware of appt date and time.

## 2022-10-15 NOTE — Telephone Encounter (Addendum)
Reviewed the patient's chart.  Dr. 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 Derrel Nip Lesion was 1.5 cm 6 years ago now is 1.9 cm Radiology mentioned that MRI could be considered    I also faxed a copy of the Cardiac CT report to Dr. Derrel Nip to view the over-read portion.

## 2022-10-17 NOTE — Telephone Encounter (Signed)
Patient called office and first available appointment was made for 10/28/2022.

## 2022-10-21 ENCOUNTER — Telehealth: Payer: Self-pay | Admitting: Internal Medicine

## 2022-10-21 ENCOUNTER — Other Ambulatory Visit: Payer: Self-pay | Admitting: Internal Medicine

## 2022-10-21 DIAGNOSIS — G96191 Perineural cyst: Secondary | ICD-10-CM | POA: Insufficient documentation

## 2022-10-21 DIAGNOSIS — R222 Localized swelling, mass and lump, trunk: Secondary | ICD-10-CM | POA: Insufficient documentation

## 2022-10-21 NOTE — Telephone Encounter (Signed)
Spoke with pt and reschedule her appt for 11/04/2022. Pt would like to know if she can take one of her Alprazolam before the MRI because it is a closed MRI and she is claustrophobic.

## 2022-10-21 NOTE — Assessment & Plan Note (Signed)
Seen again on coronary calcium CT scan in 2023 and slightly enlarged.  MRI needed

## 2022-10-21 NOTE — Telephone Encounter (Signed)
I spoke with pt to her scheduled for the MRI. Pt is scheduled on 10/29/22 and needs an appt after the MRI. I tried to schedule pt for an appt all was showing was same day appt. Next avail was 11/19/22 for me to schedule. Please advise and Thank you!

## 2022-10-22 NOTE — Telephone Encounter (Signed)
Spoke with pt and advised per Dr. Derrel Nip that she can take one alprazolam 60 minutes prior to imaging and then take one with her incase she needs it. Pt gave a verbal understanding and stated that she will have some drive her to and from the appt.

## 2022-10-22 NOTE — Telephone Encounter (Signed)
Pt returning call

## 2022-10-22 NOTE — Telephone Encounter (Signed)
LMTCB

## 2022-10-28 ENCOUNTER — Ambulatory Visit: Payer: Medicare PPO | Admitting: Internal Medicine

## 2022-10-29 ENCOUNTER — Ambulatory Visit
Admission: RE | Admit: 2022-10-29 | Discharge: 2022-10-29 | Disposition: A | Discharge status: Home / Self Care | Payer: Medicare PPO | Source: Ambulatory Visit | Attending: Internal Medicine | Admitting: Internal Medicine

## 2022-10-29 DIAGNOSIS — R222 Localized swelling, mass and lump, trunk: Secondary | ICD-10-CM | POA: Insufficient documentation

## 2022-10-29 DIAGNOSIS — M5124 Other intervertebral disc displacement, thoracic region: Secondary | ICD-10-CM | POA: Diagnosis not present

## 2022-10-29 DIAGNOSIS — M5134 Other intervertebral disc degeneration, thoracic region: Secondary | ICD-10-CM | POA: Diagnosis not present

## 2022-10-29 MED ORDER — GADOBUTROL 1 MMOL/ML IV SOLN
6.0000 mL | Freq: Once | INTRAVENOUS | Status: AC | PRN
  Administered 2022-10-29: 6 mL via INTRAVENOUS

## 2022-11-04 ENCOUNTER — Encounter: Payer: Self-pay | Admitting: Internal Medicine

## 2022-11-04 ENCOUNTER — Ambulatory Visit: Payer: Medicare PPO | Admitting: Internal Medicine

## 2022-11-04 VITALS — BP 94/72 | HR 89 | Temp 98.3°F | Ht 64.0 in | Wt 118.6 lb

## 2022-11-04 DIAGNOSIS — G96191 Perineural cyst: Secondary | ICD-10-CM

## 2022-11-04 DIAGNOSIS — I700 Atherosclerosis of aorta: Secondary | ICD-10-CM | POA: Diagnosis not present

## 2022-11-04 MED ORDER — HYDROCOD POLI-CHLORPHE POLI ER 10-8 MG/5ML PO SUER
5.0000 mL | Freq: Every evening | ORAL | 0 refills | Status: DC | PRN
Start: 2022-11-04 — End: 2023-02-02

## 2022-11-04 MED ORDER — TRIAMCINOLONE ACETONIDE 0.1 % EX CREA: 1.0000 | TOPICAL_CREAM | Freq: Two times a day (BID) | CUTANEOUS | 0 refills | Status: DC

## 2022-11-04 NOTE — Patient Instructions (Signed)
Triamcinolone and tussionex to be refilled   You should try [facility]'s Sinus rinse ;  It is a stong sinus "flush" using water and medicated salts.  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

## 2022-11-04 NOTE — Progress Notes (Unsigned)
Subjective:  Patient ID: Veronica Ferrell, female    DOB: 24-Nov-1951  Age: 70 y.o. MRN: [id number]  CC: There were no encounter diagnoses.   HPI Veronica Ferrell presents for  Chief Complaint  Patient presents with   Follow-up    Follow up to discuss MRI results.    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      calcium citrate (CALCITRATE - DOSED IN MG ELEMENTAL CALCIUM) 950 MG tablet Take 1 tablet by mouth daily.     cholecalciferol (VITAMIN D) 25 MCG (1000 UNIT) tablet Take 3 tablets (3,000 Units total) by mouth daily. 90 tablet 1   diclofenac Sodium (VOLTAREN) 1 % GEL APPLY 2 GRAMS TOPICALLY FOUR TIMES DAILY 100 g 2   estradiol (ESTRACE) 1 MG tablet TAKE ONE TABLET BY MOUTH EVERY DAY 90 tablet 3   fludrocortisone (FLORINEF) 0.1 MG tablet TAKE ONE TABLET BY MOUTH EVERY DAY 90 tablet 1   hydrocortisone (CORTEF) 20 MG tablet TAKE ONE TABLET EACH MORNING AND ONE-HALF TABLET EVERY NIGHT AS DIRECTED 135 tablet 2   ipratropium (ATROVENT) 0.06 % nasal spray USE TWO SPRAYS INTO EACH NOSTRIL FOUR TIMES A DAY 15 mL 2   Lactulose 20 GM/30ML SOLN 30 ml every 4 hours until constipation is relieved 236 mL 3   mirtazapine (REMERON) 7.5 MG tablet TAKE ONE TABLET BY MOUTH AT BEDTIME 90 tablet 1   Multiple Vitamins-Minerals (MULTIVITAMIN WITH MINERALS) tablet Take 1 tablet by mouth daily.     omeprazole (PRILOSEC) 40 MG capsule TAKE 1 CAPSULE BY MOUTH EVERY DAY 90 capsule 1   polyethylene glycol powder (GLYCOLAX/MIRALAX) powder USE AS DIRECTED 527 g 1   rosuvastatin (CRESTOR) 5 MG tablet Take 1 tablet (5 mg total) by mouth daily. 30 tablet 5   triamcinolone cream (KENALOG) 0.1 % Apply 1 application topically 2 (two) times  daily. 453.6 g 0   vitamin C (ASCORBIC ACID) 500 MG tablet Take 500 mg by mouth daily.     azithromycin (ZITHROMAX) 500 MG tablet Take 1 tablet (500 mg total) by mouth daily. (Patient not taking: Reported on 07/21/2022) 7 tablet 0   chlorpheniramine-HYDROcodone (TUSSIONEX PENNKINETIC ER) 10-8 MG/5ML Take 5 mLs by mouth at bedtime as needed. (Patient not taking: Reported on 11/04/2022) 140 mL 0   doxycycline (VIBRA-TABS) 100 MG tablet Take 1 tablet (100 mg total) by mouth 2 (two) times daily. (Patient not taking: Reported on 09/22/2022) 20 tablet 0   metoprolol tartrate (LOPRESSOR) 25 MG tablet TAKE 1 TABLET 2 HOURS PRIOR TO CT (Patient not taking: Reported on 11/04/2022) 1 tablet 0   predniSONE (DELTASONE) 10 MG tablet 6 tablets daily for 3 days, then reduce by 1 tablet daily until gone (Patient not taking: Reported on 06/11/2022) 33 tablet 0   promethazine (PHENERGAN) 12.5 MG tablet Take 1 tablet (12.5 mg total) by mouth every 6 (six) hours as needed for nausea or vomiting. (Patient not taking: Reported on 09/22/2022) 30 tablet 0   sulfamethoxazole-trimethoprim (BACTRIM DS) 800-160 MG tablet Take 1 tablet by mouth 2 (two) times daily. (Patient not taking: Reported on 09/22/2022) 14 tablet 0   No facility-administered medications prior to  visit.    Review of Systems;  Patient denies headache, fevers, malaise, unintentional weight loss, skin rash, eye pain, sinus congestion and sinus pain, sore throat, dysphagia,  hemoptysis , cough, dyspnea, wheezing, chest pain, palpitations, orthopnea, edema, abdominal pain, nausea, melena, diarrhea, constipation, flank pain, dysuria, hematuria, urinary  Frequency, nocturia, numbness, tingling, seizures,  Focal weakness, Loss of consciousness,  Tremor, insomnia, depression, anxiety, and suicidal ideation.      Objective:  BP 94/72 (BP Location: Left Arm, Patient Position: Sitting, Cuff Size: Normal)   Pulse 89   Temp 98.3 F (36.8 C) (Oral)   Ht '5\' 4"'$  (1.626 m)    Wt 118 lb 9.6 oz (53.8 kg)   SpO2 97%   BMI 20.36 kg/m   BP Readings from Last 3 Encounters:  11/04/22 94/72  10/09/22 93/65  09/22/22 90/60    Wt Readings from Last 3 Encounters:  11/04/22 118 lb 9.6 oz (53.8 kg)  09/22/22 124 lb 9 oz (56.5 kg)  07/30/22 127 lb (57.6 kg)    General appearance: alert, cooperative and appears stated age Ears: normal TM's and external ear canals both ears Throat: lips, mucosa, and tongue normal; teeth and gums normal Neck: no adenopathy, no carotid bruit, supple, symmetrical, trachea midline and thyroid not enlarged, symmetric, no tenderness/mass/nodules Back: symmetric, no curvature. ROM normal. No CVA tenderness. Lungs: clear to auscultation bilaterally Heart: regular rate and rhythm, S1, S2 normal, no murmur, click, rub or gallop Abdomen: soft, non-tender; bowel sounds normal; no masses,  no organomegaly Pulses: 2+ and symmetric Skin: Skin color, texture, turgor normal. No rashes or lesions Lymph nodes: Cervical, supraclavicular, and axillary nodes normal. Neuro:  awake and interactive with normal mood and affect. Higher cortical functions are normal. Speech is clear without word-finding difficulty or dysarthria. Extraocular movements are intact. Visual fields of both eyes are grossly intact. Sensation to light touch is grossly intact bilaterally of upper and lower extremities. Motor examination shows 4+/5 symmetric hand grip and upper extremity and 5/5 lower extremity strength. There is no pronation or drift. Gait is non-ataxic   Lab Results  Component Value Date   HGBA1C 6.3 07/14/2022   HGBA1C 6.1 01/22/2022   HGBA1C 6.0 05/16/2020    Lab Results  Component Value Date   CREATININE 0.81 10/02/2022   CREATININE 0.90 07/14/2022   CREATININE 0.83 01/30/2022    Lab Results  Component Value Date   WBC 7.1 07/14/2022   HGB 13.8 07/14/2022   HCT 40.9 07/14/2022   PLT 360.0 07/14/2022   GLUCOSE 95 10/02/2022   CHOL 230 (H) 07/16/2021    TRIG 90.0 07/16/2021   HDL 112.90 07/16/2021   LDLCALC 99 07/16/2021   ALT 15 07/14/2022   AST 20 07/14/2022   NA 135 10/02/2022   K 4.9 10/02/2022   CL 101 10/02/2022   CREATININE 0.81 10/02/2022   BUN 16 10/02/2022   CO2 28 10/02/2022   TSH 2.24 07/16/2021   HGBA1C 6.3 07/14/2022    MR THORACIC SPINE W WO CONTRAST  Result Date: 11/02/2022 CLINICAL DATA:  Enlarging right paraspinal mass. EXAM: MRI THORACIC WITHOUT AND WITH CONTRAST TECHNIQUE: Multiplanar and multiecho pulse sequences of the thoracic spine were obtained without and with intravenous contrast. CONTRAST:  11m GADAVIST GADOBUTROL 1 MMOL/ML IV SOLN COMPARISON:  CT abdomen 06/03/2016, 07/30/2018 FINDINGS: Alignment:  Physiologic. Vertebrae: No acute fracture, evidence of discitis, or aggressive bone lesion. Cord:  Normal signal and morphology. Paraspinal and other soft tissues: No acute paraspinal abnormality. 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 Disc levels: Disc spaces: Degenerative disease with disc height loss at T11-12. Disc desiccation throughout the thoracic spine. T1-T2: No disc protrusion, foraminal stenosis or central canal stenosis. T2-T3: No disc protrusion, foraminal stenosis or central canal stenosis. T3-T4: No disc protrusion, foraminal stenosis or central canal stenosis. T4-T5: No disc protrusion, foraminal stenosis or central canal stenosis. T5-T6: No disc protrusion, foraminal stenosis or central canal stenosis. T6-T7: No disc protrusion, foraminal stenosis or central canal stenosis. T7-T8: No disc protrusion, foraminal stenosis or central canal stenosis. T8-T9: No disc protrusion, foraminal stenosis or central canal stenosis. T9-T10: Small right paracentral disc protrusion. No foraminal or central canal  stenosis. T10-T11: No disc protrusion, foraminal stenosis or central canal stenosis. T11-T12: No disc protrusion, foraminal stenosis or central canal stenosis. IMPRESSION: 1. 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 Electronically Signed   By: Kathreen Devoid M.D.   On: 11/02/2022 09:17    Assessment & Plan:   Problem List Items Addressed This Visit   None   I spent a total of   minutes with this patient in a face to face visit on the date of this encounter reviewing the last office visit with me in       ,  most recent visit with cardiology ,    ,  patient's diet and exercise habits, home blood pressure /blod sugar readings, recent ER visit including labs and imaging studies ,   and post visit ordering of testing and therapeutics.    Follow-up: No follow-ups on file.   Crecencio Mc, MD

## 2022-11-05 NOTE — Addendum Note (Signed)
Addended by: Crecencio Mc on: 11/05/2022 08:11 PM   Modules accepted: Orders

## 2022-11-05 NOTE — Assessment & Plan Note (Signed)
Reviewed .  Minimal.  No treatment advised given her HDL/LDL ratio being > 1  and her coronary calcium score of zero.   Lab Results  Component Value Date   CHOL 230 (H) 07/16/2021   HDL 112.90 07/16/2021   LDLCALC 99 07/16/2021   TRIG 90.0 07/16/2021   CHOLHDL 2 07/16/2021

## 2022-11-05 NOTE — Assessment & Plan Note (Addendum)
Reviewed MRI with patient today. Located at the T9-T10 level . She is asymptomatic .  Referring to Dr Manuella Ghazi for  review

## 2022-11-10 DIAGNOSIS — H43813 Vitreous degeneration, bilateral: Secondary | ICD-10-CM | POA: Diagnosis not present

## 2022-11-17 ENCOUNTER — Telehealth: Payer: Self-pay

## 2022-11-17 NOTE — Telephone Encounter (Signed)
Patient states we referred her to Dr. Jennings Books and we asked her to please call and let us know if she has not heard from his office by today to schedule an appointment.  Patient states she has not heard from Dr. Trena Platt office.

## 2022-11-19 ENCOUNTER — Ambulatory Visit: Payer: Medicare PPO

## 2022-11-19 ENCOUNTER — Telehealth (INDEPENDENT_AMBULATORY_CARE_PROVIDER_SITE_OTHER): Payer: Medicare PPO | Admitting: Internal Medicine

## 2022-11-19 ENCOUNTER — Encounter: Payer: Self-pay | Admitting: Internal Medicine

## 2022-11-19 VITALS — BP 86/65 | HR 98 | Ht 64.0 in | Wt 118.6 lb

## 2022-11-19 DIAGNOSIS — R051 Acute cough: Secondary | ICD-10-CM | POA: Diagnosis not present

## 2022-11-19 DIAGNOSIS — U071 COVID-19: Secondary | ICD-10-CM | POA: Diagnosis not present

## 2022-11-19 DIAGNOSIS — J029 Acute pharyngitis, unspecified: Secondary | ICD-10-CM

## 2022-11-19 LAB — POCT INFLUENZA A/B
Influenza A, POC: NEGATIVE
Influenza B, POC: NEGATIVE

## 2022-11-19 LAB — POC COVID19 BINAXNOW: SARS Coronavirus 2 Ag: NEGATIVE

## 2022-11-19 MED ORDER — PREDNISONE 10 MG PO TABS: ORAL_TABLET | ORAL | 0 refills | Status: DC

## 2022-11-19 MED ORDER — ONDANSETRON 4 MG PO TBDP: 4.0000 mg | ORAL_TABLET | Freq: Three times a day (TID) | ORAL | 0 refills | Status: DC | PRN

## 2022-11-19 MED ORDER — AZITHROMYCIN 200 MG/5ML PO SUSR: 500.0000 mg | Freq: Every day | ORAL | 0 refills | Status: AC

## 2022-11-19 MED ORDER — LEVOFLOXACIN 500 MG PO TABS: 500.0000 mg | ORAL_TABLET | Freq: Every day | ORAL | 0 refills | Status: AC

## 2022-11-19 NOTE — Patient Instructions (Signed)
I have changed the antibiotic to azithromycin because it is available in liquid form  Take the zofran (dissolve in mouth) first to get the nausea under control  The the 60 mg prednisone next to keep your BP from bottoming out  THEN TAKE THE AZITHROMYCIN  CLEAR LIQUIDS TODAY  IF YOU CAN'T GET 16 OUNCES IN BY 4 PM YOU WILL NEED TO GET IV FLUIDS AT AN ER   RETEST YOURSELF FOR COVID  ON FRIDAY MORNING AND LET ME KNOW THE RESULTS

## 2022-11-19 NOTE — Progress Notes (Unsigned)
Virtual Visit via [place]   Note    This format is felt to be most appropriate for this patient at this time.  All issues noted in this document were discussed and addressed.  No physical exam was performed (except for noted visual exam findings with Video Visits).   I connected withNAME on 11/19/22 at 10:45 AM EST by a video enabled telemedicine application or telephone and verified that I am speaking with the correct person using two identifiers. Location patient: home Location provider: work or home office Persons participating in the virtual visit: patient, provider  I discussed the limitations, risks, security and privacy concerns of performing an evaluation and management service by telephone and the availability of in person appointments. I also discussed with the patient that there may be a patient responsible charge related to this service. The patient expressed understanding and agreed to proceed.  Interactive audio and video telecommunications were attempted between this provider and patient, however failed, due to patient having technical difficulties OR patient did not have access to video capability.  We continued and completed visit with audio only. ***  Reason for visit: acute illness   HPI: ***   ROS: See pertinent positives and negatives per HPI.  Past Medical History:  Diagnosis Date   Addison's disease ([place])    Arthritis    fingers   Cancer ([place])    skin   Chicken pox    Colon polyps    Concussion 12/26/2020   Diverticulitis    GERD (gastroesophageal reflux disease)     Past Surgical History:  Procedure Laterality Date   ABDOMINAL HYSTERECTOMY     APPENDECTOMY     BREAST EXCISIONAL BIOPSY Right    2010   COLONOSCOPY WITH PROPOFOL N/A 07/14/2019   Procedure: COLONOSCOPY WITH PROPOFOL;  Surgeon: Lucilla Lame, MD;  Location: [facility] ENDOSCOPY;  Service: Endoscopy;  Laterality: N/A;   ESOPHAGOGASTRODUODENOSCOPY (EGD) WITH PROPOFOL N/A 04/10/2017   Procedure:  ESOPHAGOGASTRODUODENOSCOPY (EGD) WITH PROPOFOL;  Surgeon: Lucilla Lame, MD;  Location: [place];  Service: Endoscopy;  Laterality: N/A;  requeests early as possible   FOOT SURGERY Bilateral    varicose veins      Family History  Problem Relation Age of Onset   Cancer Mother 82       Breast Ca  colon Ca (58)  and brain Ca (54)   Breast cancer Mother 40   Cancer Father    Cancer Maternal Aunt        breast   Breast cancer Maternal Aunt    Cancer Maternal Grandmother        breast   Breast cancer Maternal Grandmother    Cancer Maternal Aunt        breast ca   Breast cancer Maternal Aunt     SOCIAL HX: ***   Current Outpatient Medications:    ALPRAZolam (XANAX) 1 MG tablet, TAKE 1 TABLET BY MOUTH AT BEDTIME AS NEEDED FOR ANXIETY, Disp: 30 tablet, Rfl: 5   aspirin 81 MG tablet, Take 81 mg by mouth once a week. , Disp: , Rfl:    calcium citrate (CALCITRATE - DOSED IN MG ELEMENTAL CALCIUM) 950 MG tablet, Take 1 tablet by mouth daily., Disp: , Rfl:    chlorpheniramine-HYDROcodone (TUSSIONEX) 10-8 MG/5ML, Take 5 mLs by mouth at bedtime as needed., Disp: 140 mL, Rfl: 0   cholecalciferol (VITAMIN D) 25 MCG (1000 UNIT) tablet, Take 3 tablets (3,000 Units total) by mouth daily., Disp: 90 tablet, Rfl: 1  diclofenac Sodium (VOLTAREN) 1 % GEL, APPLY 2 GRAMS TOPICALLY FOUR TIMES DAILY, Disp: 100 g, Rfl: 2   estradiol (ESTRACE) 1 MG tablet, TAKE ONE TABLET BY MOUTH EVERY DAY, Disp: 90 tablet, Rfl: 3   fludrocortisone (FLORINEF) 0.1 MG tablet, TAKE ONE TABLET BY MOUTH EVERY DAY, Disp: 90 tablet, Rfl: 1   hydrocortisone (CORTEF) 20 MG tablet, TAKE ONE TABLET EACH MORNING AND ONE-HALF TABLET EVERY NIGHT AS DIRECTED, Disp: 135 tablet, Rfl: 2   ipratropium (ATROVENT) 0.06 % nasal spray, USE TWO SPRAYS INTO EACH NOSTRIL FOUR TIMES A DAY, Disp: 15 mL, Rfl: 2   Lactulose 20 GM/30ML SOLN, 30 ml every 4 hours until constipation is relieved, Disp: 236 mL, Rfl: 3   levofloxacin (LEVAQUIN) 500 MG  tablet, Take 1 tablet (500 mg total) by mouth daily for 7 days., Disp: 7 tablet, Rfl: 0   mirtazapine (REMERON) 7.5 MG tablet, TAKE ONE TABLET BY MOUTH AT BEDTIME, Disp: 90 tablet, Rfl: 1   Multiple Vitamins-Minerals (MULTIVITAMIN WITH MINERALS) tablet, Take 1 tablet by mouth daily., Disp: , Rfl:    omeprazole (PRILOSEC) 40 MG capsule, TAKE 1 CAPSULE BY MOUTH EVERY DAY, Disp: 90 capsule, Rfl: 1   polyethylene glycol powder (GLYCOLAX/MIRALAX) powder, USE AS DIRECTED, Disp: 527 g, Rfl: 1   predniSONE (DELTASONE) 10 MG tablet, 6 tablets daily for 3 days, then reduce by 1 tablet daily until gone, Disp: 33 tablet, Rfl: 0   rosuvastatin (CRESTOR) 5 MG tablet, Take 1 tablet (5 mg total) by mouth daily., Disp: 30 tablet, Rfl: 5   triamcinolone cream (KENALOG) 0.1 %, Apply 1 Application topically 2 (two) times daily., Disp: 453.6 g, Rfl: 0   vitamin C (ASCORBIC ACID) 500 MG tablet, Take 500 mg by mouth daily., Disp: , Rfl:   EXAM:  VITALS per patient if applicable:  GENERAL: alert, oriented, appears well and in no acute distress  HEENT: atraumatic, conjunttiva clear, no obvious abnormalities on inspection of external nose and ears  NECK: normal movements of the head and neck  LUNGS: on inspection no signs of respiratory distress, breathing rate appears normal, no obvious gross SOB, gasping or wheezing  CV: no obvious cyanosis  MS: moves all visible extremities without noticeable abnormality  PSYCH/NEURO: pleasant and cooperative, no obvious depression or anxiety, speech and thought processing grossly intact  ASSESSMENT AND PLAN:  Discussed the following assessment and plan:  Acute cough - Plan: POC COVID-19, POCT Influenza A/B, Respiratory virus panel  No problem-specific Assessment & Plan notes found for this encounter.    I discussed the assessment and treatment plan with the patient. The patient was provided an opportunity to ask questions and all were answered. The patient agreed with  the plan and demonstrated an understanding of the instructions.   The patient was advised to call back or seek an in-person evaluation if the symptoms worsen or if the condition fails to improve as anticipated.   I spent 30 minutes dedicated to the care of this patient on the date of this encounter to include pre-visit review of his medical history,  Face-to-face time with the patient , and post visit ordering of testing and therapeutics.    Crecencio Mc, MD

## 2022-11-20 ENCOUNTER — Encounter: Payer: Self-pay | Admitting: Internal Medicine

## 2022-11-20 MED ORDER — MOLNUPIRAVIR EUA 200MG CAPSULE: 4.0000 | ORAL_CAPSULE | Freq: Two times a day (BID) | ORAL | 0 refills | Status: AC

## 2022-11-20 NOTE — Assessment & Plan Note (Addendum)
Will treat empirically for strep pharyngitis with azithromycin suspension (given PCN allergy),  prednisone taper  and zofran for nausea .  [facility] COVID test was positive ; molnupiravir sent to pharmacy

## 2022-11-20 NOTE — Assessment & Plan Note (Signed)
Hone test was positive on Nov 30  adding molnupiravir

## 2022-11-25 LAB — RESPIRATORY VIRUS PANEL

## 2022-11-25 LAB — TIQ-NTM

## 2022-12-02 NOTE — Telephone Encounter (Signed)
MyChart messgae sent to patient. 

## 2022-12-23 ENCOUNTER — Other Ambulatory Visit: Payer: Self-pay | Admitting: Internal Medicine

## 2022-12-23 DIAGNOSIS — K219 Gastro-esophageal reflux disease without esophagitis: Secondary | ICD-10-CM

## 2022-12-29 ENCOUNTER — Telehealth: Payer: Self-pay

## 2022-12-29 NOTE — Telephone Encounter (Signed)
Pt called to follow up on getting fitted for knee high compression hose. She saw provider last June and did not purchase any at that time and would like to come in and get fitted this Thursday.

## 2023-01-01 ENCOUNTER — Encounter (INDEPENDENT_AMBULATORY_CARE_PROVIDER_SITE_OTHER): Payer: Medicare PPO

## 2023-01-01 DIAGNOSIS — I8393 Asymptomatic varicose veins of bilateral lower extremities: Secondary | ICD-10-CM

## 2023-01-01 NOTE — Progress Notes (Signed)
Pt came in as a walk in to get fitted for knee high compression hose (15-20 mm Hg). She was measured and put in a size MEDIUM. Pt tried them on before leaving. No questions/concerns at this time.

## 2023-01-08 ENCOUNTER — Other Ambulatory Visit: Payer: Self-pay | Admitting: Internal Medicine

## 2023-01-08 DIAGNOSIS — Z1231 Encounter for screening mammogram for malignant neoplasm of breast: Secondary | ICD-10-CM

## 2023-01-13 DIAGNOSIS — M25552 Pain in left hip: Secondary | ICD-10-CM | POA: Diagnosis not present

## 2023-01-13 DIAGNOSIS — E271 Primary adrenocortical insufficiency: Secondary | ICD-10-CM | POA: Diagnosis not present

## 2023-01-13 DIAGNOSIS — G96191 Perineural cyst: Secondary | ICD-10-CM | POA: Diagnosis not present

## 2023-01-30 ENCOUNTER — Other Ambulatory Visit (INDEPENDENT_AMBULATORY_CARE_PROVIDER_SITE_OTHER): Payer: Medicare PPO

## 2023-01-30 DIAGNOSIS — R7303 Prediabetes: Secondary | ICD-10-CM | POA: Diagnosis not present

## 2023-01-30 DIAGNOSIS — E785 Hyperlipidemia, unspecified: Secondary | ICD-10-CM

## 2023-01-30 DIAGNOSIS — R5383 Other fatigue: Secondary | ICD-10-CM | POA: Diagnosis not present

## 2023-01-30 LAB — CBC WITH DIFFERENTIAL/PLATELET
Basophils Absolute: 0 10*3/uL (ref 0.0–0.1)
Basophils Relative: 0.7 % (ref 0.0–3.0)
Eosinophils Absolute: 0.1 10*3/uL (ref 0.0–0.7)
Eosinophils Relative: 1.8 % (ref 0.0–5.0)
HCT: 39.2 % (ref 36.0–46.0)
Hemoglobin: 13.4 g/dL (ref 12.0–15.0)
Lymphocytes Relative: 60.7 % — ABNORMAL HIGH (ref 12.0–46.0)
Lymphs Abs: 4 10*3/uL (ref 0.7–4.0)
MCHC: 34.2 g/dL (ref 30.0–36.0)
MCV: 95.7 fl (ref 78.0–100.0)
Monocytes Absolute: 0.7 10*3/uL (ref 0.1–1.0)
Monocytes Relative: 9.9 % (ref 3.0–12.0)
Neutro Abs: 1.8 10*3/uL (ref 1.4–7.7)
Neutrophils Relative %: 26.9 % — ABNORMAL LOW (ref 43.0–77.0)
Platelets: 371 10*3/uL (ref 150.0–400.0)
RBC: 4.1 Mil/uL (ref 3.87–5.11)
RDW: 13.6 % (ref 11.5–15.5)
WBC: 6.6 10*3/uL (ref 4.0–10.5)

## 2023-01-30 LAB — MICROALBUMIN / CREATININE URINE RATIO
Creatinine,U: 60.3 mg/dL
Microalb Creat Ratio: 1.2 mg/g (ref 0.0–30.0)
Microalb, Ur: <0.7 mg/dL (ref 0.0–1.9)

## 2023-01-30 LAB — LIPID PANEL
Cholesterol: 218 mg/dL — ABNORMAL HIGH (ref 0–200)
HDL: 88.5 mg/dL (ref >39.00)
LDL Cholesterol: 112 mg/dL — ABNORMAL HIGH (ref 0–99)
NonHDL: 129.71
Total CHOL/HDL Ratio: 2
Triglycerides: 90 mg/dL (ref 0.0–149.0)
VLDL: 18 mg/dL (ref 0.0–40.0)

## 2023-01-30 LAB — LDL CHOLESTEROL, DIRECT: Direct LDL: 91 mg/dL

## 2023-01-30 LAB — COMPREHENSIVE METABOLIC PANEL
ALT: 11 U/L (ref 0–35)
AST: 15 U/L (ref 0–37)
Albumin: 4.1 g/dL (ref 3.5–5.2)
Alkaline Phosphatase: 32 U/L — ABNORMAL LOW (ref 39–117)
BUN: 16 mg/dL (ref 6–23)
CO2: 27 mEq/L (ref 19–32)
Calcium: 9.2 mg/dL (ref 8.4–10.5)
Chloride: 98 mEq/L (ref 96–112)
Creatinine, Ser: 0.85 mg/dL (ref 0.40–1.20)
GFR: 67.79 mL/min (ref >60.00)
Glucose, Bld: 85 mg/dL (ref 70–99)
Potassium: 4.9 mEq/L (ref 3.5–5.1)
Sodium: 135 mEq/L (ref 135–145)
Total Bilirubin: 0.5 mg/dL (ref 0.2–1.2)
Total Protein: 6.3 g/dL (ref 6.0–8.3)

## 2023-01-30 LAB — TSH: TSH: 3.68 u[IU]/mL (ref 0.35–5.50)

## 2023-01-30 LAB — HEMOGLOBIN A1C: Hgb A1c MFr Bld: 6.1 % (ref 4.6–6.5)

## 2023-02-02 ENCOUNTER — Ambulatory Visit: Payer: Medicare PPO | Admitting: Internal Medicine

## 2023-02-02 ENCOUNTER — Encounter: Payer: Self-pay | Admitting: Internal Medicine

## 2023-02-02 VITALS — BP 90/68 | HR 83 | Temp 97.7°F | Ht 64.0 in | Wt 119.8 lb

## 2023-02-02 DIAGNOSIS — K5909 Other constipation: Secondary | ICD-10-CM

## 2023-02-02 DIAGNOSIS — K222 Esophageal obstruction: Secondary | ICD-10-CM

## 2023-02-02 DIAGNOSIS — I8312 Varicose veins of left lower extremity with inflammation: Secondary | ICD-10-CM

## 2023-02-02 DIAGNOSIS — K219 Gastro-esophageal reflux disease without esophagitis: Secondary | ICD-10-CM

## 2023-02-02 DIAGNOSIS — Z0000 Encounter for general adult medical examination without abnormal findings: Secondary | ICD-10-CM

## 2023-02-02 DIAGNOSIS — Z780 Asymptomatic menopausal state: Secondary | ICD-10-CM

## 2023-02-02 DIAGNOSIS — I700 Atherosclerosis of aorta: Secondary | ICD-10-CM

## 2023-02-02 DIAGNOSIS — R7303 Prediabetes: Secondary | ICD-10-CM

## 2023-02-02 DIAGNOSIS — E271 Primary adrenocortical insufficiency: Secondary | ICD-10-CM

## 2023-02-02 MED ORDER — MIRTAZAPINE 7.5 MG PO TABS: 7.5000 mg | ORAL_TABLET | Freq: Every day | ORAL | 1 refills | Status: DC

## 2023-02-02 MED ORDER — FLUDROCORTISONE ACETATE 0.1 MG PO TABS
100.0000 ug | ORAL_TABLET | Freq: Every day | ORAL | 3 refills | Status: DC
Start: 2023-02-02 — End: 2024-02-15

## 2023-02-02 MED ORDER — ONDANSETRON 4 MG PO TBDP: 4.0000 mg | ORAL_TABLET | Freq: Three times a day (TID) | ORAL | 0 refills | Status: DC | PRN

## 2023-02-02 MED ORDER — ESTRADIOL 1 MG PO TABS: 1.0000 mg | ORAL_TABLET | Freq: Every day | ORAL | 3 refills | Status: DC

## 2023-02-02 MED ORDER — OMEPRAZOLE 40 MG PO CPDR: DELAYED_RELEASE_CAPSULE | ORAL | 3 refills | Status: DC

## 2023-02-02 MED ORDER — ROSUVASTATIN CALCIUM 5 MG PO TABS: 5.0000 mg | ORAL_TABLET | Freq: Every day | ORAL | 3 refills | Status: DC

## 2023-02-02 NOTE — Assessment & Plan Note (Signed)

## 2023-02-02 NOTE — Assessment & Plan Note (Signed)
Reviewed .  Minimal.  Advised to try 5 mg rosuvastatin  Lab Results  Component Value Date   CHOL 218 (H) 01/30/2023   HDL 88.50 01/30/2023   LDLCALC 112 (H) 01/30/2023   LDLDIRECT 91.0 01/30/2023   TRIG 90.0 01/30/2023   CHOLHDL 2 01/30/2023

## 2023-02-02 NOTE — Patient Instructions (Addendum)
  I highly recommend reading "the End of Alzheimer's: The First Program to Prevent and Reverse Cognitive Decline"  by Brent Bulla, MD    Your labs are FINE .  Nothing to worry about .  I do recommend taking rosuvastatin to reduce your risk of stroke   I have refilled the zofran dissolving tablets   I recommend Benefiber as an alternative to Metamucil if not effective.    Your  DEXA scan  been ordered to be done at [facility].  You are encouraged (required) to call to schedule your own  appointment at [facility]  , and their phone number is [phone]   You might want to try using Relaxium for insomnia  (as seen on TV commercials) . It is available through [employer] and contains all natural supplements:  Melatonin 5 mg  Chamomile 25 mg Passionflower extract 75 mg GABA 100 mg Ashwaganda extract 125 mg Magnesium citrate, glycinate, oxide (100 mg)  L tryptophan 500 mg Valerest (proprietary  ingredient ; probably valeria root extract)

## 2023-02-02 NOTE — Progress Notes (Signed)
Patient ID: Veronica Ferrell, female    DOB: 1951/11/10  Age: 72 y.o. MRN: [id number]  The patient is here for annual preventive examination and management of other chronic and acute problems.   The risk factors are reflected in the social history.  The roster of all physicians providing medical care to patient - is listed in the Snapshot section of the chart.  Activities of daily living:  The patient is 100% independent in all ADLs: dressing, toileting, feeding as well as independent mobility  Home safety : The patient has smoke detectors in the home. They wear seatbelts.  There are no firearms at home. There is no violence in the home.   There is no risks for hepatitis, STDs or HIV. There is no   history of blood transfusion. They have no travel history to infectious disease endemic areas of the world.  The patient has seen their dentist in the last six month. They have seen their eye doctor in the last year. They admit to slight hearing difficulty with regard to whispered voices and some television programs.  They have deferred audiologic testing in the last year.  They do not  have excessive sun exposure. Discussed the need for sun protection: hats, long sleeves and use of sunscreen if there is significant sun exposure.   Diet: the importance of a healthy diet is discussed. They do have a healthy diet. 6 The benefits of regular aerobic exercise were discussed. She walks 4 times per week ,  20 minutes.   Depression screen: there are no signs or vegative symptoms of depression- irritability, change in appetite, anhedonia, sadness/tearfullness.  Cognitive assessment: the patient manages all their financial and personal affairs and is actively engaged. They could relate day,date,year and events; recalled 2/3 objects at 3 minutes; performed clock-face test normally.  The following portions of the patient's history were reviewed and updated as appropriate: allergies, current medications, past  family history, past medical history,  past surgical history, past social history  and problem list.  Visual acuity was not assessed per patient preference since she has regular follow up with her ophthalmologist. Hearing and body mass index were assessed and reviewed.   During the course of the visit the patient was educated and counseled about appropriate screening and preventive services including : fall prevention , diabetes screening, nutrition counseling, colorectal cancer screening, and recommended immunizations.    CC: The primary encounter diagnosis was Postmenopausal estrogen deficiency. Diagnoses of GERD with stricture, Addison's disease ([place]), Chronic constipation, Encounter for preventive health examination, Abdominal aortic atherosclerosis ([place]), Prediabetes, and Varicose veins of left lower extremity with inflammation were also pertinent to this visit.  Has  1) Aortic atherosclerosis . Reviewed  Cardiac CT ; coronary calcium score zero  Oct 2023 . Has not started 5 mg crestor.    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 Referred to neurology.  Saw Dr. Manuella Ghazi .  No intervention  needed   3) sleeping with remeron and headspace app  Constipation  managed with metamucil   5) [facility] annually    History Daniel has a past medical history of Addison's disease ([place]), Arthritis, Cancer ([place]), Chicken pox, Colon polyps, Concussion (12/26/2020), Diverticulitis, and GERD (gastroesophageal reflux disease).   She has a past surgical history that includes varicose veins; Appendectomy; Abdominal hysterectomy; Foot surgery (Bilateral); Esophagogastroduodenoscopy (egd) with propofol (N/A, 04/10/2017);  Colonoscopy with propofol (N/A, 07/14/2019); and Breast excisional biopsy (Right).   Her family history includes Breast cancer in her maternal aunt, maternal aunt, and maternal grandmother; Breast cancer (age of  onset: 18) in her mother; Cancer in her father, maternal aunt, maternal aunt, and maternal grandmother; Cancer (age of onset: 71) in her mother.She reports that she has never smoked. She has never used smokeless tobacco. She reports current alcohol use of about 7.0 standard drinks of alcohol per week. She reports that she does not use drugs.  Outpatient Medications Prior to Visit  Medication Sig Dispense Refill   ALPRAZolam (XANAX) 1 MG tablet TAKE 1 TABLET BY MOUTH AT BEDTIME AS NEEDED FOR ANXIETY 30 tablet 5   aspirin 81 MG tablet Take 81 mg by mouth once a week.      calcium citrate (CALCITRATE - DOSED IN MG ELEMENTAL CALCIUM) 950 MG tablet Take 1 tablet by mouth daily.     cholecalciferol (VITAMIN D) 25 MCG (1000 UNIT) tablet Take 3 tablets (3,000 Units total) by mouth daily. 90 tablet 1   diclofenac Sodium (VOLTAREN) 1 % GEL APPLY 2 GRAMS TOPICALLY FOUR TIMES DAILY 100 g 2   hydrocortisone (CORTEF) 20 MG tablet TAKE ONE TABLET EACH MORNING AND ONE-HALF TABLET EVERY NIGHT AS DIRECTED 135 tablet 2   ipratropium (ATROVENT) 0.06 % nasal spray USE TWO SPRAYS INTO EACH NOSTRIL FOUR TIMES A DAY 15 mL 2   Lactulose 20 GM/30ML SOLN 30 ml every 4 hours until constipation is relieved 236 mL 3   Multiple Vitamins-Minerals (MULTIVITAMIN WITH MINERALS) tablet Take 1 tablet by mouth daily.     polyethylene glycol powder (GLYCOLAX/MIRALAX) powder USE AS DIRECTED 527 g 1   triamcinolone cream (KENALOG) 0.1 % Apply 1 Application topically 2 (two) times daily. 453.6 g 0   vitamin C (ASCORBIC ACID) 500 MG tablet Take 500 mg by mouth daily.     estradiol (ESTRACE) 1 MG tablet TAKE ONE TABLET BY MOUTH EVERY DAY 90 tablet 3   fludrocortisone (FLORINEF) 0.1 MG tablet TAKE ONE TABLET BY MOUTH EVERY DAY 90 tablet 1   mirtazapine (REMERON) 7.5 MG tablet TAKE ONE TABLET BY MOUTH AT BEDTIME 90 tablet 1   omeprazole (PRILOSEC) 40 MG capsule TAKE 1 CAPSULE BY MOUTH EVERY DAY 90 capsule 1   ondansetron (ZOFRAN-ODT) 4 MG  disintegrating tablet Take 1 tablet (4 mg total) by mouth every 8 (eight) hours as needed for nausea or vomiting. 20 tablet 0   chlorpheniramine-HYDROcodone (TUSSIONEX) 10-8 MG/5ML Take 5 mLs by mouth at bedtime as needed. (Patient not taking: Reported on 02/02/2023) 140 mL 0   predniSONE (DELTASONE) 10 MG tablet 6 tablets daily for 3 days, then reduce by 1 tablet daily until gone (Patient not taking: Reported on 02/02/2023) 33 tablet 0   rosuvastatin (CRESTOR) 5 MG tablet Take 1 tablet (5 mg total) by mouth daily. (Patient not taking: Reported on 02/02/2023) 30 tablet 5   No facility-administered medications prior to visit.    Review of Systems  Patient denies headache, fevers, malaise, unintentional weight loss, skin rash, eye pain, sinus congestion and sinus pain, sore throat, dysphagia,  hemoptysis , cough, dyspnea, wheezing, chest pain, palpitations, orthopnea, edema, abdominal pain, nausea, melena, diarrhea, constipation, flank pain, dysuria, hematuria, urinary  Frequency, nocturia, numbness, tingling, seizures,  Focal weakness, Loss of consciousness,  Tremor, insomnia, depression, anxiety, and suicidal ideation.     Objective:  BP 90/68   Pulse 83   Temp 97.7 F (36.5 C) (Oral)  Ht 5' 4"$  (1.626 m)   Wt 119 lb 12.8 oz (54.3 kg)   SpO2 97%   BMI 20.56 kg/m   Physical Exam Vitals reviewed.  Constitutional:      General: She is not in acute distress.    Appearance: Normal appearance. She is well-developed and normal weight. She is not ill-appearing, toxic-appearing or diaphoretic.  HENT:     Head: Normocephalic.     Right Ear: Tympanic membrane, ear canal and external ear normal. There is no impacted cerumen.     Left Ear: Tympanic membrane, ear canal and external ear normal. There is no impacted cerumen.     Nose: Nose normal.     Mouth/Throat:     Mouth: Mucous membranes are moist.     Pharynx: Oropharynx is clear.  Eyes:     General: No scleral icterus.       Right eye: No  discharge.        Left eye: No discharge.     Conjunctiva/sclera: Conjunctivae normal.     Pupils: Pupils are equal, round, and reactive to light.  Neck:     Thyroid: No thyromegaly.     Vascular: No carotid bruit or JVD.  Cardiovascular:     Rate and Rhythm: Normal rate and regular rhythm.     Heart sounds: Normal heart sounds.  Pulmonary:     Effort: Pulmonary effort is normal. No respiratory distress.     Breath sounds: Normal breath sounds.  Chest:  Breasts:    Breasts are symmetrical.     Right: No swelling, inverted nipple, mass, nipple discharge, skin change or tenderness.     Left: Normal. No swelling, inverted nipple, mass, nipple discharge, skin change or tenderness.       Comments: Surgical scar  Abdominal:     General: Bowel sounds are normal.     Palpations: Abdomen is soft. There is no mass.     Tenderness: There is no abdominal tenderness. There is no guarding or rebound.  Musculoskeletal:        General: Normal range of motion.     Cervical back: Normal range of motion and neck supple.  Lymphadenopathy:     Cervical: No cervical adenopathy.     Upper Body:     Right upper body: No supraclavicular, axillary or pectoral adenopathy.     Left upper body: No supraclavicular, axillary or pectoral adenopathy.  Skin:    General: Skin is warm and dry.  Neurological:     General: No focal deficit present.     Mental Status: She is alert and oriented to person, place, and time. Mental status is at baseline.  Psychiatric:        Mood and Affect: Mood normal.        Behavior: Behavior normal.        Thought Content: Thought content normal.        Judgment: Judgment normal.     Assessment & Plan:  Postmenopausal estrogen deficiency -     DG Bone Density; Future  GERD with stricture -     Omeprazole; TAKE 1 CAPSULE BY MOUTH EVERY DAY  Dispense: 90 capsule; Refill: 3  Addison's disease ([place]) Assessment & Plan: She has had noninvasive cardiology evaluation by Dr  Rockey Situ for evaluation of extreme  Fatigue episodes following her exercise without chest pain or dyspnea.   Continue current steroids  Lab Results  Component Value Date   NA 135 01/30/2023   K 4.9 01/30/2023  CL 98 01/30/2023   CO2 27 01/30/2023   Lab Results  Component Value Date   CREATININE 0.85 01/30/2023      Chronic constipation Assessment & Plan: No recent obstipation issues using current bowel regimen and increased water intake    Encounter for preventive health examination Assessment & Plan: age appropriate education and counseling updated, referrals for preventative services and immunizations addressed, dietary and smoking counseling addressed, most recent labs reviewed.  I have personally reviewed and have noted:   1) the patient's medical and social history 2) The pt's use of alcohol, tobacco, and illicit drugs 3) The patient's current medications and supplements 4) Functional ability including ADL's, fall risk, home safety risk, hearing and visual impairment 5) Diet and physical activities 6) Evidence for depression or mood disorder 7) The patient's height, weight, and BMI have been recorded in the chart   I have made referrals, and provided counseling and education based on review of the above   Abdominal aortic atherosclerosis ([place]) Assessment & Plan: Reviewed .  Minimal.  Advised to try 5 mg rosuvastatin  Lab Results  Component Value Date   CHOL 218 (H) 01/30/2023   HDL 88.50 01/30/2023   LDLCALC 112 (H) 01/30/2023   LDLDIRECT 91.0 01/30/2023   TRIG 90.0 01/30/2023   CHOLHDL 2 01/30/2023      Prediabetes Assessment & Plan: Reviewed prepvious labs,  Fasting glucose.  s he patient feels generally well, is walking several times per week for exercise and weighing weekly .  Following a carbohydrate modified diet 5 days per week. Denies any significant weight change, and has no symptoms suggestive of diabetes (polyuria, polyphagia, polydipsia).      Varicose veins of left lower extremity with inflammation Assessment & Plan: Improved symptos with use of compression stockings    Other orders -     Estradiol; Take 1 tablet (1 mg total) by mouth daily.  Dispense: 90 tablet; Refill: 3 -     Fludrocortisone Acetate; Take 1 tablet (100 mcg total) by mouth daily.  Dispense: 90 tablet; Refill: 3 -     Mirtazapine; Take 1 tablet (7.5 mg total) by mouth at bedtime.  Dispense: 90 tablet; Refill: 1 -     Rosuvastatin Calcium; Take 1 tablet (5 mg total) by mouth daily.  Dispense: 90 tablet; Refill: 3 -     Ondansetron; Take 1 tablet (4 mg total) by mouth every 8 (eight) hours as needed for nausea or vomiting.  Dispense: 20 tablet; Refill: 0      I provided 40 minutes of  face-to-face time during this encounter reviewing patient's current problems and past surgeries,  recent labs and imaging studies, providing counseling on the above mentioned problems , and coordination  of care .   Follow-up: Return in about 6 months (around 08/03/2023).   Crecencio Mc, MD

## 2023-02-02 NOTE — Assessment & Plan Note (Signed)
Reviewed prepvious labs,  Fasting glucose.  s he patient feels generally well, is walking several times per week for exercise and weighing weekly .  Following a carbohydrate modified diet 5 days per week. Denies any significant weight change, and has no symptoms suggestive of diabetes (polyuria, polyphagia, polydipsia).

## 2023-02-02 NOTE — Assessment & Plan Note (Signed)
Improved symptos with use of compression stockings

## 2023-02-02 NOTE — Assessment & Plan Note (Signed)
No recent obstipation issues using current bowel regimen and increased water intake

## 2023-02-02 NOTE — Assessment & Plan Note (Signed)
She has had noninvasive cardiology evaluation by Dr Rockey Situ for evaluation of extreme  Fatigue episodes following her exercise without chest pain or dyspnea.   Continue current steroids  Lab Results  Component Value Date   NA 135 01/30/2023   K 4.9 01/30/2023   CL 98 01/30/2023   CO2 27 01/30/2023   Lab Results  Component Value Date   CREATININE 0.85 01/30/2023

## 2023-02-16 ENCOUNTER — Ambulatory Visit
Admission: RE | Admit: 2023-02-16 | Discharge: 2023-02-16 | Disposition: A | Discharge status: Home / Self Care | Payer: Medicare PPO | Source: Ambulatory Visit | Attending: Internal Medicine | Admitting: Internal Medicine

## 2023-02-16 DIAGNOSIS — M810 Age-related osteoporosis without current pathological fracture: Secondary | ICD-10-CM | POA: Diagnosis not present

## 2023-02-16 DIAGNOSIS — Z780 Asymptomatic menopausal state: Secondary | ICD-10-CM | POA: Diagnosis not present

## 2023-02-23 ENCOUNTER — Ambulatory Visit
Admission: RE | Admit: 2023-02-23 | Discharge: 2023-02-23 | Disposition: A | Discharge status: Home / Self Care | Payer: Medicare PPO | Source: Ambulatory Visit | Attending: Internal Medicine | Admitting: Internal Medicine

## 2023-02-23 DIAGNOSIS — Z1231 Encounter for screening mammogram for malignant neoplasm of breast: Secondary | ICD-10-CM | POA: Diagnosis not present

## 2023-02-24 ENCOUNTER — Other Ambulatory Visit: Payer: Self-pay

## 2023-03-02 ENCOUNTER — Other Ambulatory Visit: Payer: Self-pay

## 2023-03-02 MED ORDER — IPRATROPIUM BROMIDE 0.06 % NA SOLN: 2.0000 | Freq: Four times a day (QID) | NASAL | 2 refills | Status: DC

## 2023-03-11 DIAGNOSIS — D2261 Melanocytic nevi of right upper limb, including shoulder: Secondary | ICD-10-CM | POA: Diagnosis not present

## 2023-03-11 DIAGNOSIS — R208 Other disturbances of skin sensation: Secondary | ICD-10-CM | POA: Diagnosis not present

## 2023-03-11 DIAGNOSIS — Z85828 Personal history of other malignant neoplasm of skin: Secondary | ICD-10-CM | POA: Diagnosis not present

## 2023-03-11 DIAGNOSIS — L814 Other melanin hyperpigmentation: Secondary | ICD-10-CM | POA: Diagnosis not present

## 2023-03-11 DIAGNOSIS — D2271 Melanocytic nevi of right lower limb, including hip: Secondary | ICD-10-CM | POA: Diagnosis not present

## 2023-03-11 DIAGNOSIS — L570 Actinic keratosis: Secondary | ICD-10-CM | POA: Diagnosis not present

## 2023-03-11 DIAGNOSIS — L821 Other seborrheic keratosis: Secondary | ICD-10-CM | POA: Diagnosis not present

## 2023-04-01 ENCOUNTER — Encounter: Payer: Self-pay | Admitting: Internal Medicine

## 2023-04-15 ENCOUNTER — Encounter: Payer: Self-pay | Admitting: Internal Medicine

## 2023-04-16 NOTE — Telephone Encounter (Signed)
Alprazolam  Refilled: 04/08/2022 Last OV: 02/02/2023 Next OV: 07/27/2023

## 2023-04-17 MED ORDER — ALPRAZOLAM 1 MG PO TABS: ORAL_TABLET | ORAL | 5 refills | Status: DC

## 2023-05-18 ENCOUNTER — Other Ambulatory Visit: Payer: Self-pay | Admitting: Internal Medicine

## 2023-06-07 ENCOUNTER — Encounter: Payer: Self-pay | Admitting: Internal Medicine

## 2023-06-08 ENCOUNTER — Encounter: Payer: Self-pay | Admitting: Family Medicine

## 2023-06-08 ENCOUNTER — Ambulatory Visit (INDEPENDENT_AMBULATORY_CARE_PROVIDER_SITE_OTHER): Payer: Medicare PPO

## 2023-06-08 ENCOUNTER — Ambulatory Visit: Payer: Medicare PPO | Admitting: Family Medicine

## 2023-06-08 VITALS — BP 122/74 | HR 84 | Temp 97.5°F | Ht 64.0 in | Wt 118.8 lb

## 2023-06-08 DIAGNOSIS — R1084 Generalized abdominal pain: Secondary | ICD-10-CM

## 2023-06-08 DIAGNOSIS — R109 Unspecified abdominal pain: Secondary | ICD-10-CM | POA: Diagnosis not present

## 2023-06-08 DIAGNOSIS — K5909 Other constipation: Secondary | ICD-10-CM | POA: Diagnosis not present

## 2023-06-08 LAB — POCT URINALYSIS DIP (CLINITEK)
Bilirubin, UA: NEGATIVE
Blood, UA: NEGATIVE
Glucose, UA: NEGATIVE mg/dL
Ketones, POC UA: NEGATIVE mg/dL
Leukocytes, UA: NEGATIVE
Nitrite, UA: NEGATIVE
POC PROTEIN,UA: NEGATIVE
Spec Grav, UA: 1.02 (ref 1.010–1.025)
Urobilinogen, UA: 0.2 E.U./dL
pH, UA: 6 (ref 5.0–8.0)

## 2023-06-08 LAB — CBC
HCT: 42.8 % (ref 36.0–46.0)
Hemoglobin: 14.2 g/dL (ref 12.0–15.0)
MCHC: 33.1 g/dL (ref 30.0–36.0)
MCV: 97.4 fl (ref 78.0–100.0)
Platelets: 399 10*3/uL (ref 150.0–400.0)
RBC: 4.39 Mil/uL (ref 3.87–5.11)
RDW: 13.3 % (ref 11.5–15.5)
WBC: 9.1 10*3/uL (ref 4.0–10.5)

## 2023-06-08 LAB — COMPREHENSIVE METABOLIC PANEL
ALT: 12 U/L (ref 0–35)
AST: 16 U/L (ref 0–37)
Albumin: 4.3 g/dL (ref 3.5–5.2)
Alkaline Phosphatase: 33 U/L — ABNORMAL LOW (ref 39–117)
BUN: 13 mg/dL (ref 6–23)
CO2: 25 mEq/L (ref 19–32)
Calcium: 9.2 mg/dL (ref 8.4–10.5)
Chloride: 98 mEq/L (ref 96–112)
Creatinine, Ser: 0.94 mg/dL (ref 0.40–1.20)
GFR: 59.93 mL/min — ABNORMAL LOW (ref >60.00)
Glucose, Bld: 104 mg/dL — ABNORMAL HIGH (ref 70–99)
Potassium: 4.5 mEq/L (ref 3.5–5.1)
Sodium: 132 mEq/L — ABNORMAL LOW (ref 135–145)
Total Bilirubin: 0.4 mg/dL (ref 0.2–1.2)
Total Protein: 7.5 g/dL (ref 6.0–8.3)

## 2023-06-08 MED ORDER — LACTULOSE 20 GM/30ML PO SOLN
ORAL | 1 refills | Status: DC
Start: 2023-06-08 — End: 2024-07-12

## 2023-06-08 NOTE — Telephone Encounter (Signed)
Pt scheduled an appt with Dr. Clent Ridges this morning.

## 2023-06-08 NOTE — Patient Instructions (Addendum)
It was a pleasure meeting you today. Thank you for allowing me to take part in your health care.  Our goals for today as we discussed include:  We will get some labs today.  If they are abnormal or we need to do something about them, I will call you.  If they are normal, I will send you a message on MyChart (if it is active) or a letter in the mail.  If you don't hear from [place] in 2 weeks, please call the office at the number below.   Will get abdominal xray today  Start Lactulose 30 ml every 4 hours until constipation has relieved Continue Miralax 1 scoop at night Continue Stool Softener at night Continue to hydrate with water Continue soluble fiber intake  If you develop any fevers or worsening abdominal pain please notify MD and have someone take you to ED  Follow up in 1 week with PCP  If you have any questions or concerns, please do not hesitate to call the office at [phone].  I look forward to our next visit and until then take care and stay safe.  Regards,   Dana Allan, MD   [facility]

## 2023-06-08 NOTE — Progress Notes (Signed)
   SUBJECTIVE:   Chief Complaint  Patient presents with   Abdominal Pain    Abdominal pain, cramps & losing weight x 12 days Patient is constipated sometimes Patient has had Diverticulitis   HPI Patient presents today for acute visit.  Abdominal pain Recently traveled to [place].  Spent 2 weeks there and noticed change in diet.  9 days ago she took 4 tablets of Senokot.  Following day she had 12-15 BMs that were soft.  Still has some cramping during flight back.  Monday through Wednesday symptoms were okay. Thursday symptoms worsened.  Still moving bowels was not taking Senokot.  Has been hydrating well and able to eat but endorses not much appetite.  BMs are now long cylindrical hard at times.  Denies any fevers, bloody stool, urinary symptoms, back pain, nausea or vomiting.  Reports 4 pound weight loss from home scales.  Endorses history of constipation.  She takes a stool softener and MiraLAX nightly.  Fiber daily and Senokot as needed.  She reports that for the past 12 days she is eating nothing but chicken broth and crackers.  She continues to have lower abdominal cramping   PERTINENT PMH / PSH: Diverticulosis Chronic constipation   OBJECTIVE:  BP 122/74   Pulse 84   Temp (!) 97.5 F (36.4 C)   Ht 5\' 4"  (1.626 m)   Wt 118 lb 12.8 oz (53.9 kg)   SpO2 98%   BMI 20.39 kg/m    Physical Exam Constitutional:      General: She is not in acute distress.    Appearance: She is normal weight. She is not ill-appearing or toxic-appearing.  Abdominal:     General: Abdomen is flat. Bowel sounds are increased. There is no distension or abdominal bruit.     Palpations: Abdomen is soft. There is no mass.     Tenderness: There is generalized abdominal tenderness. There is no right CVA tenderness, left CVA tenderness, guarding or rebound. Negative signs include Murphy's sign, Rovsing's sign and McBurney's sign.     Hernia: No hernia is present.  Neurological:     Mental Status: She is  alert.     ASSESSMENT/PLAN:  Generalized abdominal pain Assessment & Plan: Suspect change in diet contributing to worsening constipation despite use of Senokot. No acute abdomen on exam. Will obtain KUB Start lactulose 30 mL every 4 hours until constipation is relieved Continue MiraLAX daily Continue stool softener daily Continue soluble fiber Increase water intake Follow-up with PCP in 1 week If symptoms worsen recommend evaluation in ED.  Orders: -     CBC -     Comprehensive metabolic panel -     POCT URINALYSIS DIP (CLINITEK) -     DG Abd 1 View; Future  Chronic constipation -     Lactulose; 30 ml every 4 hours until constipation is relieved  Dispense: 237 mL; Refill: 1   PDMP reviewed  Return in about 1 week (around 06/15/2023) for PCP.  06/14/23 Called patient to review labs and xray.  No answer.  LVM to return call to discuss.  Dana Allan, MD

## 2023-06-09 ENCOUNTER — Encounter: Payer: Self-pay | Admitting: Family Medicine

## 2023-06-14 ENCOUNTER — Encounter: Payer: Self-pay | Admitting: Family Medicine

## 2023-06-14 DIAGNOSIS — R1084 Generalized abdominal pain: Secondary | ICD-10-CM | POA: Insufficient documentation

## 2023-06-14 NOTE — Assessment & Plan Note (Signed)
Suspect change in diet contributing to worsening constipation despite use of Senokot. No acute abdomen on exam. Will obtain KUB Start lactulose 30 mL every 4 hours until constipation is relieved Continue MiraLAX daily Continue stool softener daily Continue soluble fiber Increase water intake Follow-up with PCP in 1 week If symptoms worsen recommend evaluation in ED.

## 2023-06-15 ENCOUNTER — Ambulatory Visit: Payer: Medicare PPO | Admitting: Internal Medicine

## 2023-06-15 ENCOUNTER — Ambulatory Visit
Admission: RE | Admit: 2023-06-15 | Discharge: 2023-06-15 | Disposition: A | Discharge status: Home / Self Care | Payer: Medicare PPO | Source: Ambulatory Visit | Attending: Internal Medicine | Admitting: Internal Medicine

## 2023-06-15 ENCOUNTER — Encounter: Payer: Self-pay | Admitting: Internal Medicine

## 2023-06-15 VITALS — BP 92/60 | HR 82 | Temp 98.2°F | Ht 64.0 in | Wt 118.2 lb

## 2023-06-15 DIAGNOSIS — R1032 Left lower quadrant pain: Secondary | ICD-10-CM | POA: Insufficient documentation

## 2023-06-15 DIAGNOSIS — K5730 Diverticulosis of large intestine without perforation or abscess without bleeding: Secondary | ICD-10-CM | POA: Diagnosis not present

## 2023-06-15 DIAGNOSIS — N200 Calculus of kidney: Secondary | ICD-10-CM | POA: Diagnosis not present

## 2023-06-15 DIAGNOSIS — K5792 Diverticulitis of intestine, part unspecified, without perforation or abscess without bleeding: Secondary | ICD-10-CM

## 2023-06-15 DIAGNOSIS — K760 Fatty (change of) liver, not elsewhere classified: Secondary | ICD-10-CM | POA: Diagnosis not present

## 2023-06-15 DIAGNOSIS — E871 Hypo-osmolality and hyponatremia: Secondary | ICD-10-CM | POA: Diagnosis not present

## 2023-06-15 DIAGNOSIS — E271 Primary adrenocortical insufficiency: Secondary | ICD-10-CM | POA: Diagnosis not present

## 2023-06-15 LAB — BASIC METABOLIC PANEL
BUN: 22 mg/dL (ref 6–23)
CO2: 24 mEq/L (ref 19–32)
Calcium: 9.4 mg/dL (ref 8.4–10.5)
Chloride: 97 mEq/L (ref 96–112)
Creatinine, Ser: 0.93 mg/dL (ref 0.40–1.20)
GFR: 60.7 mL/min (ref >60.00)
Glucose, Bld: 103 mg/dL — ABNORMAL HIGH (ref 70–99)
Potassium: 4.6 mEq/L (ref 3.5–5.1)
Sodium: 129 mEq/L — ABNORMAL LOW (ref 135–145)

## 2023-06-15 MED ORDER — IOHEXOL 300 MG/ML  SOLN
100.0000 mL | Freq: Once | INTRAMUSCULAR | Status: AC | PRN
  Administered 2023-06-15: 100 mL via INTRAVENOUS

## 2023-06-15 NOTE — Assessment & Plan Note (Signed)
Recurrent,  due to addison's and increased free water intake.  Repeat pending.  Advise to increase flornief and add gatorade for daily use

## 2023-06-15 NOTE — Assessment & Plan Note (Signed)
Advised to increase florinef   to 2 tablets daily for the next week due to hyponatremia and hypotension s

## 2023-06-15 NOTE — Progress Notes (Signed)
Subjective:  Patient ID: Veronica Ferrell, female    DOB: 10/03/1951  Age: 72 y.o. MRN: [id number]  CC: The primary encounter diagnosis was Hyponatremia. Diagnoses of Left lower quadrant abdominal pain, Addison's disease (HCC), and Acute diverticulitis were also pertinent to this visit.   HPI FENNA SEMEL presents for  Chief Complaint  Patient presents with   Medical Management of Chronic Issues    1 week follow up      Patient is a 72 yr old female with Addison's Disease , diverticulosis and chronic constipation who presents for one week  follow up  for Change in bowel habits with unintended 4 lb weight loss  by home scales following a trip to [place] that resulted in constipation and laxative use.  Marland Kitchen  She was  seen one week ago by Dana Allan MD . KUB and labs normal except for mild hyponatremia (chronic(  advised to start lactulose to relieve constipation and continue home regimen for constipation.  She reports some improvement,  after starting lactulose.  Took 2 doses daily for  2 day sand felt "cleaned out".  48 hours after stopping the lactulose she felt better but not normal.  Still didn't feel like exercising ,  too fatigued  did not check BP since BP was 122 in office.  Increased her water intake but not Gatorade.  Has gained 1 1lb by her scales.  Has broadened her diet and eating more quantity and more variety without early fullness .   But symptoms have returned, stools are soft,  bu not liquid.  Still thin in caliber and short in length.   her lower abdomen feels heavy ,  rumbles a lot.  And has pre stooling cramping resolved with stooling .  No nausea   in the past week.  Not woken up by symptoms.  But symptoms are present upon waking.  Some fecal urgency before her first meal,      "Suspect change in diet contributing to worsening constipation despite use of Senokot. No acute abdomen on exam. Will obtain KUB Start lactulose 30 mL every 4 hours until constipation is  relieved Continue MiraLAX daily Continue stool softener daily Continue soluble fiber Increase water intake Follow-up with PCP in 1 week"   Outpatient Medications Prior to Visit  Medication Sig Dispense Refill   ALPRAZolam (XANAX) 1 MG tablet TAKE 1 TABLET BY MOUTH AT BEDTIME AS NEEDED FOR ANXIETY 30 tablet 5   aspirin 81 MG tablet Take 81 mg by mouth once a week.      calcium citrate (CALCITRATE - DOSED IN MG ELEMENTAL CALCIUM) 950 MG tablet Take 1 tablet by mouth daily.     cholecalciferol (VITAMIN D) 25 MCG (1000 UNIT) tablet Take 3 tablets (3,000 Units total) by mouth daily. 90 tablet 1   diclofenac Sodium (VOLTAREN) 1 % GEL APPLY 2 GRAMS TOPICALLY FOUR TIMES DAILY 100 g 2   estradiol (ESTRACE) 1 MG tablet Take 1 tablet (1 mg total) by mouth daily. 90 tablet 3   fludrocortisone (FLORINEF) 0.1 MG tablet Take 1 tablet (100 mcg total) by mouth daily. 90 tablet 3   hydrocortisone (CORTEF) 20 MG tablet TAKE ONE TABLET EACH MORNING AND ONE-HALF TABLET EVERY NIGHT AS DIRECTED 135 tablet 2   ipratropium (ATROVENT) 0.06 % nasal spray USE 2 SPRAYS IN BOTH NOSTRILS FOUR TIMESDAILY AS DIRECTED. 15 mL 2   Lactulose 20 GM/30ML SOLN 30 ml every 4 hours until constipation is relieved 237 mL 1  mirtazapine (REMERON) 7.5 MG tablet Take 1 tablet (7.5 mg total) by mouth at bedtime. 90 tablet 1   Multiple Vitamins-Minerals (MULTIVITAMIN WITH MINERALS) tablet Take 1 tablet by mouth daily.     omeprazole (PRILOSEC) 40 MG capsule TAKE 1 CAPSULE BY MOUTH EVERY DAY 90 capsule 3   ondansetron (ZOFRAN-ODT) 4 MG disintegrating tablet Take 1 tablet (4 mg total) by mouth every 8 (eight) hours as needed for nausea or vomiting. 20 tablet 0   polyethylene glycol powder (GLYCOLAX/MIRALAX) powder USE AS DIRECTED 527 g 1   rosuvastatin (CRESTOR) 5 MG tablet Take 1 tablet (5 mg total) by mouth daily. 90 tablet 3   triamcinolone cream (KENALOG) 0.1 % Apply 1 Application topically 2 (two) times daily. 453.6 g 0   vitamin C  (ASCORBIC ACID) 500 MG tablet Take 500 mg by mouth daily.     No facility-administered medications prior to visit.    Review of Systems;  Patient denies headache, fevers, malaise,  skin rash, eye pain, sinus congestion and sinus pain, sore throat, dysphagia,  hemoptysis , cough, dyspnea, wheezing, chest pain, palpitations, orthopnea, edema, , nausea, melena, diarrhea,  flank pain, dysuria, hematuria, urinary  Frequency, nocturia, numbness, tingling, seizures,  Focal weakness, Loss of consciousness,  Tremor, insomnia, depression, anxiety, and suicidal ideation.      Objective:  BP 92/60   Pulse 82   Temp 98.2 F (36.8 C) (Oral)   Ht 5\' 4"  (1.626 m)   Wt 118 lb 3.2 oz (53.6 kg)   SpO2 97%   BMI 20.29 kg/m   BP Readings from Last 3 Encounters:  06/15/23 92/60  06/08/23 122/74  02/02/23 90/68    Wt Readings from Last 3 Encounters:  06/15/23 118 lb 3.2 oz (53.6 kg)  06/08/23 118 lb 12.8 oz (53.9 kg)  02/02/23 119 lb 12.8 oz (54.3 kg)    Physical Exam Vitals reviewed.  Constitutional:      General: She is not in acute distress.    Appearance: Normal appearance. She is normal weight. She is not ill-appearing, toxic-appearing or diaphoretic.  HENT:     Head: Normocephalic.  Eyes:     General: No scleral icterus.       Right eye: No discharge.        Left eye: No discharge.     Conjunctiva/sclera: Conjunctivae normal.  Cardiovascular:     Rate and Rhythm: Normal rate and regular rhythm.     Heart sounds: Normal heart sounds.  Pulmonary:     Effort: Pulmonary effort is normal. No respiratory distress.     Breath sounds: Normal breath sounds.  Abdominal:     General: Bowel sounds are increased. There is distension.     Palpations: Abdomen is soft. There is no mass.     Tenderness: There is abdominal tenderness in the left upper quadrant.    Musculoskeletal:        General: Normal range of motion.  Skin:    General: Skin is warm and dry.  Neurological:     General:  No focal deficit present.     Mental Status: She is alert and oriented to person, place, and time. Mental status is at baseline.  Psychiatric:        Mood and Affect: Mood normal.        Behavior: Behavior normal.        Thought Content: Thought content normal.        Judgment: Judgment normal.    Lab Results  Component Value Date   HGBA1C 6.1 01/30/2023   HGBA1C 6.3 07/14/2022   HGBA1C 6.1 01/22/2022    Lab Results  Component Value Date   CREATININE 0.93 06/15/2023   CREATININE 0.94 06/08/2023   CREATININE 0.85 01/30/2023    Lab Results  Component Value Date   WBC 9.1 06/08/2023   HGB 14.2 06/08/2023   HCT 42.8 06/08/2023   PLT 399.0 06/08/2023   GLUCOSE 103 (H) 06/15/2023   CHOL 218 (H) 01/30/2023   TRIG 90.0 01/30/2023   HDL 88.50 01/30/2023   LDLDIRECT 91.0 01/30/2023   LDLCALC 112 (H) 01/30/2023   ALT 12 06/08/2023   AST 16 06/08/2023   NA 129 (L) 06/15/2023   K 4.6 06/15/2023   CL 97 06/15/2023   CREATININE 0.93 06/15/2023   BUN 22 06/15/2023   CO2 24 06/15/2023   TSH 3.68 01/30/2023   HGBA1C 6.1 01/30/2023   MICROALBUR <0.7 01/30/2023    MM 3D SCREEN BREAST BILATERAL  Result Date: 02/25/2023 CLINICAL DATA:  Screening. EXAM: DIGITAL SCREENING BILATERAL MAMMOGRAM WITH TOMOSYNTHESIS AND CAD TECHNIQUE: Bilateral screening digital craniocaudal and mediolateral oblique mammograms were obtained. Bilateral screening digital breast tomosynthesis was performed. The images were evaluated with computer-aided detection. COMPARISON:  Previous exam(s). ACR Breast Density Category d: The breasts are extremely dense, which lowers the sensitivity of mammography. FINDINGS: There are no findings suspicious for malignancy. IMPRESSION: No mammographic evidence of malignancy. A result letter of this screening mammogram will be mailed directly to the patient. RECOMMENDATION: Screening mammogram in one year. (Code:SM-B-01Y) BI-RADS CATEGORY  1: Negative. Electronically Signed   By:  Sherian Rein M.D.   On: 02/25/2023 11:38    Assessment & Plan:  .Hyponatremia Assessment & Plan: Recurrent,  due to addison's and increased free water intake.  Repeat pending.  Advise to increase flornief and add gatorade for daily use   Orders: -     Basic metabolic panel  Left lower quadrant abdominal pain Assessment & Plan: Associated with bowel changes and LLQ pain , history of diverticulosis.   STAT CT abd and pelvis ordered   Orders: -     CT ABDOMEN PELVIS W CONTRAST; Future  Addison's disease [facility]) Assessment & Plan: Advised to increase florinef   to 2 tablets daily for the next week due to hyponatremia and hypotension s   Acute diverticulitis  Other orders -     Ciprofloxacin HCl; Take 1 tablet (500 mg total) by mouth 2 (two) times daily.  Dispense: 28 tablet; Refill: 0 -     metroNIDAZOLE; Take 1 tablet (500 mg total) by mouth 3 (three) times daily for 10 days.  Dispense: 30 tablet; Refill: 0     I provided 30 minutes of face-to-face time during this encounter reviewing patient's last visit with me, patient's  most recent visit with  GI, last colonoscopy, previous  labs and imaging studies, counseling on currently addressed issues,  and post visit ordering to diagnostics and therapeutics .   Follow-up: No follow-ups on file.   Sherlene Shams, MD

## 2023-06-15 NOTE — Assessment & Plan Note (Signed)
Associated with bowel changes and LLQ pain , history of diverticulosis.   STAT CT abd and pelvis ordered

## 2023-06-15 NOTE — Patient Instructions (Addendum)
I would like to obtain  CT of ab and pelvis to rule out diverticulitis  Continue your current regimen  Please increase your florinef to 2 tablets daily for the next week

## 2023-06-16 DIAGNOSIS — K5792 Diverticulitis of intestine, part unspecified, without perforation or abscess without bleeding: Secondary | ICD-10-CM | POA: Insufficient documentation

## 2023-06-16 MED ORDER — CIPROFLOXACIN HCL 500 MG PO TABS: 500.0000 mg | ORAL_TABLET | Freq: Two times a day (BID) | ORAL | 0 refills | Status: DC

## 2023-06-16 MED ORDER — METRONIDAZOLE 500 MG PO TABS: 500.0000 mg | ORAL_TABLET | Freq: Three times a day (TID) | ORAL | 0 refills | Status: AC

## 2023-06-16 NOTE — Assessment & Plan Note (Signed)
Sigmoid, uncomplicated

## 2023-06-16 NOTE — Addendum Note (Signed)
Addended by: Sherlene Shams on: 06/16/2023 12:57 PM   Modules accepted: Orders

## 2023-06-20 ENCOUNTER — Other Ambulatory Visit: Payer: Self-pay | Admitting: Internal Medicine

## 2023-06-22 ENCOUNTER — Telehealth: Payer: Self-pay | Admitting: Internal Medicine

## 2023-06-22 DIAGNOSIS — E785 Hyperlipidemia, unspecified: Secondary | ICD-10-CM

## 2023-06-22 DIAGNOSIS — R7303 Prediabetes: Secondary | ICD-10-CM

## 2023-06-22 NOTE — Telephone Encounter (Signed)
Patient is scheduled for a CPE on 07/27/23 and wanted to know if she could have labs done before her CPE.

## 2023-06-22 NOTE — Addendum Note (Signed)
Addended by: Sandy Salaam on: 06/22/2023 01:50 PM   Modules accepted: Orders

## 2023-06-22 NOTE — Addendum Note (Signed)
Addended by: Sherlene Shams on: 06/22/2023 02:01 PM   Modules accepted: Orders

## 2023-06-22 NOTE — Telephone Encounter (Signed)
Last OV 06/15/23 okay to fill?

## 2023-06-22 NOTE — Telephone Encounter (Signed)
Labs have been pended for your approval.  

## 2023-06-22 NOTE — Telephone Encounter (Signed)
Spoke with pt and scheduled lab appt

## 2023-07-22 ENCOUNTER — Other Ambulatory Visit (INDEPENDENT_AMBULATORY_CARE_PROVIDER_SITE_OTHER): Payer: Medicare PPO

## 2023-07-22 ENCOUNTER — Encounter (INDEPENDENT_AMBULATORY_CARE_PROVIDER_SITE_OTHER): Payer: Self-pay

## 2023-07-22 DIAGNOSIS — R7303 Prediabetes: Secondary | ICD-10-CM

## 2023-07-22 DIAGNOSIS — E785 Hyperlipidemia, unspecified: Secondary | ICD-10-CM | POA: Diagnosis not present

## 2023-07-22 LAB — COMPREHENSIVE METABOLIC PANEL
ALT: 13 U/L (ref 0–35)
AST: 18 U/L (ref 0–37)
Albumin: 4.4 g/dL (ref 3.5–5.2)
Alkaline Phosphatase: 31 U/L — ABNORMAL LOW (ref 39–117)
BUN: 21 mg/dL (ref 6–23)
CO2: 28 mEq/L (ref 19–32)
Calcium: 10 mg/dL (ref 8.4–10.5)
Chloride: 98 mEq/L (ref 96–112)
Creatinine, Ser: 0.99 mg/dL (ref 0.40–1.20)
GFR: 56.27 mL/min — ABNORMAL LOW (ref >60.00)
Glucose, Bld: 95 mg/dL (ref 70–99)
Potassium: 5.4 mEq/L — ABNORMAL HIGH (ref 3.5–5.1)
Sodium: 134 mEq/L — ABNORMAL LOW (ref 135–145)
Total Bilirubin: 0.6 mg/dL (ref 0.2–1.2)
Total Protein: 7 g/dL (ref 6.0–8.3)

## 2023-07-22 LAB — LIPID PANEL
Cholesterol: 212 mg/dL — ABNORMAL HIGH (ref 0–200)
HDL: 106.8 mg/dL (ref >39.00)
LDL Cholesterol: 89 mg/dL (ref 0–99)
NonHDL: 104.87
Total CHOL/HDL Ratio: 2
Triglycerides: 81 mg/dL (ref 0.0–149.0)
VLDL: 16.2 mg/dL (ref 0.0–40.0)

## 2023-07-22 LAB — LDL CHOLESTEROL, DIRECT: Direct LDL: 73 mg/dL

## 2023-07-22 LAB — HEMOGLOBIN A1C: Hgb A1c MFr Bld: 6 % (ref 4.6–6.5)

## 2023-07-23 ENCOUNTER — Encounter: Payer: Medicare PPO | Admitting: Internal Medicine

## 2023-07-24 ENCOUNTER — Encounter: Payer: Self-pay | Admitting: Internal Medicine

## 2023-07-27 ENCOUNTER — Ambulatory Visit (INDEPENDENT_AMBULATORY_CARE_PROVIDER_SITE_OTHER): Payer: Medicare PPO

## 2023-07-27 ENCOUNTER — Ambulatory Visit (INDEPENDENT_AMBULATORY_CARE_PROVIDER_SITE_OTHER): Payer: Medicare PPO | Admitting: Internal Medicine

## 2023-07-27 ENCOUNTER — Encounter: Payer: Self-pay | Admitting: Internal Medicine

## 2023-07-27 VITALS — BP 84/62 | HR 81 | Temp 98.1°F | Ht 64.0 in | Wt 117.0 lb

## 2023-07-27 DIAGNOSIS — I700 Atherosclerosis of aorta: Secondary | ICD-10-CM

## 2023-07-27 DIAGNOSIS — K5792 Diverticulitis of intestine, part unspecified, without perforation or abscess without bleeding: Secondary | ICD-10-CM

## 2023-07-27 DIAGNOSIS — E271 Primary adrenocortical insufficiency: Secondary | ICD-10-CM | POA: Diagnosis not present

## 2023-07-27 DIAGNOSIS — E876 Hypokalemia: Secondary | ICD-10-CM

## 2023-07-27 DIAGNOSIS — E875 Hyperkalemia: Secondary | ICD-10-CM | POA: Diagnosis not present

## 2023-07-27 DIAGNOSIS — N951 Menopausal and female climacteric states: Secondary | ICD-10-CM

## 2023-07-27 DIAGNOSIS — E559 Vitamin D deficiency, unspecified: Secondary | ICD-10-CM

## 2023-07-27 DIAGNOSIS — G96191 Perineural cyst: Secondary | ICD-10-CM | POA: Diagnosis not present

## 2023-07-27 DIAGNOSIS — Z0000 Encounter for general adult medical examination without abnormal findings: Secondary | ICD-10-CM

## 2023-07-27 DIAGNOSIS — E871 Hypo-osmolality and hyponatremia: Secondary | ICD-10-CM

## 2023-07-27 DIAGNOSIS — N6459 Other signs and symptoms in breast: Secondary | ICD-10-CM

## 2023-07-27 LAB — BASIC METABOLIC PANEL
BUN: 19 mg/dL (ref 6–23)
CO2: 24 mEq/L (ref 19–32)
Calcium: 9.6 mg/dL (ref 8.4–10.5)
Chloride: 95 mEq/L — ABNORMAL LOW (ref 96–112)
Creatinine, Ser: 0.83 mg/dL (ref 0.40–1.20)
GFR: 69.52 mL/min (ref >60.00)
Glucose, Bld: 102 mg/dL — ABNORMAL HIGH (ref 70–99)
Potassium: 5.1 mEq/L (ref 3.5–5.1)
Sodium: 129 mEq/L — ABNORMAL LOW (ref 135–145)

## 2023-07-27 LAB — VITAMIN D 25 HYDROXY (VIT D DEFICIENCY, FRACTURES): VITD: 58.32 ng/mL (ref 30.00–100.00)

## 2023-07-27 NOTE — Assessment & Plan Note (Signed)

## 2023-07-27 NOTE — Assessment & Plan Note (Signed)
Sigmoid,  confirmed by June 2024  CT   resolved clinically

## 2023-07-27 NOTE — Assessment & Plan Note (Signed)
Reviewed findings on prior CT , which are  Minimal.  Tolerating  5 mg rosuvastatin  Lab Results  Component Value Date   CHOL 212 (H) 07/22/2023   HDL 106.80 07/22/2023   LDLCALC 89 07/22/2023   LDLDIRECT 73.0 07/22/2023   TRIG 81.0 07/22/2023   CHOLHDL 2 07/22/2023

## 2023-07-27 NOTE — Assessment & Plan Note (Signed)
She has felt weak since her episode of diverticulitis;  home BP's have been dipping into the 80's systolic, and recent screening labs noted hyperkalemia, which has resolved, but her hyponatremia is persistent and stable.  I have recommended that she resume 0.1 mg florinef daily (currently alternating between  0.1 and 0.05 mg per Dr [name])   Lab Results  Component Value Date   NA 129 (L) 07/27/2023   K 5.1 07/27/2023   CL 95 (L) 07/27/2023   CO2 24 07/27/2023

## 2023-07-27 NOTE — Assessment & Plan Note (Signed)
Increase in frequency despite using estrogen.  Checking chest x ray for LAD>  CBC normal  Lab Results  Component Value Date   WBC 9.1 06/08/2023   HGB 14.2 06/08/2023   HCT 42.8 06/08/2023   MCV 97.4 06/08/2023   PLT 399.0 06/08/2023

## 2023-07-27 NOTE — Assessment & Plan Note (Addendum)
Slightly enlarged from prior  MRI  on June 2024 at  T9-T10 level . She remains asymptomatic and has had neurologic opinion : no surgery unless she becomes symptomatic

## 2023-07-27 NOTE — Progress Notes (Unsigned)
Patient ID: Veronica Ferrell, female    DOB: October 01, 1951  Age: 72 y.o. MRN: [id number]  The patient is here for annual preventive examination and management of other chronic and acute problems.   The risk factors are reflected in the social history.   The roster of all physicians providing medical care to patient - is listed in the Snapshot section of the chart.   Activities of daily living:  The patient is 100% independent in all ADLs: dressing, toileting, feeding as well as independent mobility   Home safety : The patient has smoke detectors in the home. They wear seatbelts.  There are no unsecured firearms at home. There is no violence in the home.    There is no risks for hepatitis, STDs or HIV. There is no   history of blood transfusion. They have no travel history to infectious disease endemic areas of the world.   The patient has seen their dentist in the last six month. They have seen their eye doctor in the last year. The patinet  denies slight hearing difficulty with regard to whispered voices and some television programs.  They have deferred audiologic testing in the last year.  They do not  have excessive sun exposure. Discussed the need for sun protection: hats, long sleeves and use of sunscreen if there is significant sun exposure.    Diet: the importance of a healthy diet is discussed. They do have a healthy diet.   The benefits of regular aerobic exercise were discussed. The patient  exercises  3 to 5 days per week  for  60 minutes.    Depression screen: there are no signs or vegative symptoms of depression- irritability, change in appetite, anhedonia, sadness/tearfullness.   The following portions of the patient's history were reviewed and updated as appropriate: allergies, current medications, past family history, past medical history,  past surgical history, past social history  and problem list.   Visual acuity was not assessed per patient preference since the patient has  regular follow up with an  ophthalmologist. Hearing and body mass index were assessed and reviewed.    During the course of the visit the patient was educated and counseled about appropriate screening and preventive services including : fall prevention , diabetes screening, nutrition counseling, colorectal cancer screening, and recommended immunizations.    Chief Complaint:  1) Adrenal insufficiency with hypotension, hyperkalemia, hyponatremia:  taking florinef 0.1 mg daily and cortef 20 mg qam, 10 mg at bedtime . (Per solum.  Advised to alternate between 0.1 mg and 0.05 mg )  BP has been in the 80's to 90's     2) Recent diverticulitis:  took 3 weeks to fully recover.  Still feels weak  .  Increased florinef to 0.2 mg daily for the illness, but back to alternating between 0.1 and 0.05  3)  wearing invisiline for the past 8 weeks for bite alteration from bruxism  4) bowels movig more regularly .  Also taking culturelle   Review of Symptoms  Patient denies headache, fevers, malaise, unintentional weight loss, skin rash, eye pain, sinus congestion and sinus pain, sore throat, dysphagia,  hemoptysis , cough, dyspnea, wheezing, chest pain, palpitations, orthopnea, edema, abdominal pain, nausea, melena, diarrhea, constipation, flank pain, dysuria, hematuria, urinary  Frequency, nocturia, numbness, tingling, seizures,  Focal weakness, Loss of consciousness,  Tremor, insomnia, depression, anxiety, and suicidal ideation.    Physical Exam:  BP (!) 84/62   Pulse 81   Temp 98.1 F (36.7  C) (Oral)   Ht 5\' 4"  (1.626 m)   Wt 117 lb (53.1 kg)   SpO2 96%   BMI 20.08 kg/m    Physical Exam Vitals reviewed.  Constitutional:      General: She is not in acute distress.    Appearance: Normal appearance. She is well-developed and normal weight. She is not ill-appearing, toxic-appearing or diaphoretic.  HENT:     Head: Normocephalic.     Right Ear: Tympanic membrane, ear canal and external ear normal.  There is no impacted cerumen.     Left Ear: Tympanic membrane, ear canal and external ear normal. There is no impacted cerumen.     Nose: Nose normal.     Mouth/Throat:     Mouth: Mucous membranes are moist.     Pharynx: Oropharynx is clear.  Eyes:     General: No scleral icterus.       Right eye: No discharge.        Left eye: No discharge.     Conjunctiva/sclera: Conjunctivae normal.     Pupils: Pupils are equal, round, and reactive to light.  Neck:     Thyroid: No thyromegaly.     Vascular: No carotid bruit or JVD.  Cardiovascular:     Rate and Rhythm: Normal rate and regular rhythm.     Heart sounds: Normal heart sounds.  Pulmonary:     Effort: Pulmonary effort is normal. No respiratory distress.     Breath sounds: Normal breath sounds.  Chest:  Breasts:    Breasts are symmetrical.     Right: Normal. No swelling, inverted nipple, mass, nipple discharge, skin change or tenderness.     Left: Normal. No swelling, inverted nipple, mass, nipple discharge, skin change or tenderness.  Abdominal:     General: Bowel sounds are normal.     Palpations: Abdomen is soft. There is no mass.     Tenderness: There is no abdominal tenderness. There is no guarding or rebound.  Musculoskeletal:        General: Normal range of motion.     Cervical back: Normal range of motion and neck supple.  Lymphadenopathy:     Cervical: No cervical adenopathy.     Upper Body:     Right upper body: No supraclavicular, axillary or pectoral adenopathy.     Left upper body: No supraclavicular, axillary or pectoral adenopathy.  Skin:    General: Skin is warm and dry.  Neurological:     General: No focal deficit present.     Mental Status: She is alert and oriented to person, place, and time. Mental status is at baseline.  Psychiatric:        Mood and Affect: Mood normal.        Behavior: Behavior normal.        Thought Content: Thought content normal.        Judgment: Judgment normal.    Assessment  and Plan: Hypokalemia  Hyperkalemia -     Basic metabolic panel  Abdominal aortic atherosclerosis (HCC) Assessment & Plan: Reviewed .  Minimal.  Tolerating  5 mg rosuvastatin  Lab Results  Component Value Date   CHOL 212 (H) 07/22/2023   HDL 106.80 07/22/2023   LDLCALC 89 07/22/2023   LDLDIRECT 73.0 07/22/2023   TRIG 81.0 07/22/2023   CHOLHDL 2 07/22/2023      Acute diverticulitis Assessment & Plan: Sigmoid,  confirmed by June 2024  CT   resolved clinically    Encounter for preventive  health examination Assessment & Plan: age appropriate education and counseling updated, referrals for preventative services and immunizations addressed, dietary and smoking counseling addressed, most recent labs reviewed.  I have personally reviewed and have noted:   1) the patient's medical and social history 2) The pt's use of alcohol, tobacco, and illicit drugs 3) The patient's current medications and supplements 4) Functional ability including ADL's, fall risk, home safety risk, hearing and visual impairment 5) Diet and physical activities 6) Evidence for depression or mood disorder 7) The patient's height, weight, and BMI have been recorded in the chart  I have made referrals, and provided counseling and education based on review of the above    Perineural cyst Assessment & Plan: Slightly enlarged from prior  MRI  on June 2024 at  T9-T10 level . She remains asymptomatic and has had neurologic opinion : no surgery unless she becomes symptomatic   Flushing, menopausal Assessment & Plan: Increase in frequency despite using estrogen.  Checking chest x ray for LAD>  CBC normal  Lab Results  Component Value Date   WBC 9.1 06/08/2023   HGB 14.2 06/08/2023   HCT 42.8 06/08/2023   MCV 97.4 06/08/2023   PLT 399.0 06/08/2023     Orders: -     DG Chest 2 View; Future  Vitamin D deficiency -     VITAMIN D 25 Hydroxy (Vit-D Deficiency, Fractures)  Addison's disease  (HCC) Assessment & Plan: SHE has felt weak since her episode of diverticulitis;  home BP's have been dipping itno the 80's systolic, and recent screening labs note hyperkalemia .  I have recommended that she resume 0.1 mg florinef daily (currently alternating between  0.1 and 0.05 mg per Dr [name])      No follow-ups on file.  Sherlene Shams, MD

## 2023-07-27 NOTE — Patient Instructions (Signed)
Chest x ray today  Increase florinef to 0.1 mg DAILY FOR AT LEAST A FEW WEEKS TO SEE IF YOUR FATIGUE AND BLOOD PRESSURE IMPROVE  IF POTASSIUM IS STILL HIGH I'LL RECOMMEND LONG TERM INCREASE IN FLORINEF

## 2023-07-28 NOTE — Assessment & Plan Note (Signed)
Recurrent, and stable  due to addison's and increased free water intake.   Advise to increase flornief and add gatorade for daily use

## 2023-07-28 NOTE — Assessment & Plan Note (Signed)
She is currently taking 2000 Ius daily,  from various sources of supplements.  Level is pending

## 2023-07-28 NOTE — Assessment & Plan Note (Signed)
Continue annual mammograms .  Last year's diagnostic mammogram of right breast was normal

## 2023-08-19 ENCOUNTER — Ambulatory Visit (INDEPENDENT_AMBULATORY_CARE_PROVIDER_SITE_OTHER): Payer: Medicare PPO | Admitting: *Deleted

## 2023-08-19 VITALS — Ht 64.0 in | Wt 118.0 lb

## 2023-08-19 DIAGNOSIS — Z0000 Encounter for general adult medical examination without abnormal findings: Secondary | ICD-10-CM | POA: Diagnosis not present

## 2023-08-19 NOTE — Patient Instructions (Signed)
[name] , Thank you for taking time to come for your Medicare Wellness Visit. I appreciate your ongoing commitment to your health goals. Please review the following plan we discussed and let me know if I can assist you in the future.   Referrals/Orders/Follow-Ups/Clinician Recommendations: None  This is a list of the screening recommended for you and due dates:  Health Maintenance  Topic Date Due   COVID-19 Vaccine (4 - 2023-24 season) 08/22/2022   Flu Shot  03/21/2024*   Mammogram  02/23/2024   Colon Cancer Screening  07/13/2024   Medicare Annual Wellness Visit  08/18/2024   DTaP/Tdap/Td vaccine (4 - Td or Tdap) 03/17/2032   Pneumonia Vaccine  Completed   DEXA scan (bone density measurement)  Completed   Hepatitis C Screening  Completed   Zoster (Shingles) Vaccine  Completed   HPV Vaccine  Aged Out  *Topic was postponed. The date shown is not the original due date.    Advanced directives: (Copy Requested) Please bring a copy of your health care power of attorney and living will to the office to be added to your chart at your convenience.  Next Medicare Annual Wellness Visit scheduled for next year: Yes 9/2//25 @ 3:45

## 2023-08-19 NOTE — Progress Notes (Addendum)
Subjective:   Veronica Ferrell is a 72 y.o. female who presents for Medicare Annual (Subsequent) preventive examination.  Visit Complete: Virtual  I connected with  Richelle Ito on 08/19/23 by a audio enabled telemedicine application and verified that I am speaking with the correct person using two identifiers.  Patient Location: Home  Provider Location: Office/Clinic  I discussed the limitations of evaluation and management by telemedicine. The patient expressed understanding and agreed to proceed.  Patient Medicare AWV questionnaire was completed by the patient on 08/12/23; I have confirmed that all information answered by patient is correct and no changes since this date.  Vital Signs: Unable to obtain new vitals due to this being a telehealth visit.   Review of Systems     Cardiac Risk Factors include: advanced age (>64men, >59 women);dyslipidemia     Objective:    Today's Vitals   08/19/23 1452  Weight: 118 lb (53.5 kg)  Height: 5\' 4"  (1.626 m)   Body mass index is 20.25 kg/m.     08/19/2023    3:04 PM 07/30/2022    3:09 PM 07/11/2021    1:04 PM 07/10/2020    9:36 AM 07/14/2019    8:05 AM 06/20/2019   11:25 AM 07/30/2018    8:46 AM  Advanced Directives  Does Patient Have a Medical Advance Directive? Yes Yes Yes Yes Yes Yes No  Type of Estate agent of [place];Living will Healthcare Power of [place];Living will Healthcare Power of [place];Living will Healthcare Power of [place];Living will Living will Healthcare Power of [place];Living will   Does patient want to make changes to medical advance directive?  No - Patient declined No - Patient declined No - Patient declined  No - Patient declined   Copy of Healthcare Power of Attorney in Chart? No - copy requested No - copy requested No - copy requested No - copy requested  No - copy requested     Current Medications (verified) Outpatient Encounter Medications as of 08/19/2023  Medication Sig    ALPRAZolam (XANAX) 1 MG tablet TAKE 1 TABLET BY MOUTH AT BEDTIME AS NEEDED FOR ANXIETY   aspirin 81 MG tablet Take 81 mg by mouth once a week.    calcium citrate (CALCITRATE - DOSED IN MG ELEMENTAL CALCIUM) 950 MG tablet Take 1 tablet by mouth daily.   cholecalciferol (VITAMIN D) 25 MCG (1000 UNIT) tablet Take 3 tablets (3,000 Units total) by mouth daily.   diclofenac Sodium (VOLTAREN) 1 % GEL APPLY 2 GRAMS TOPICALLY FOUR TIMES DAILY   estradiol (ESTRACE) 1 MG tablet Take 1 tablet (1 mg total) by mouth daily.   fludrocortisone (FLORINEF) 0.1 MG tablet Take 1 tablet (100 mcg total) by mouth daily.   hydrocortisone (CORTEF) 20 MG tablet TAKE ONE TABLET BY MOUTH EVERY MORNING AND TAKE ONE-HALF TABLET BY MOUTH EVERY NIGHT AS DIRECTED   ipratropium (ATROVENT) 0.06 % nasal spray USE 2 SPRAYS IN BOTH NOSTRILS FOUR TIMESDAILY AS DIRECTED.   Lactulose 20 GM/30ML SOLN 30 ml every 4 hours until constipation is relieved   mirtazapine (REMERON) 7.5 MG tablet Take 1 tablet (7.5 mg total) by mouth at bedtime.   Multiple Vitamins-Minerals (MULTIVITAMIN WITH MINERALS) tablet Take 1 tablet by mouth daily.   omeprazole (PRILOSEC) 40 MG capsule TAKE 1 CAPSULE BY MOUTH EVERY DAY   ondansetron (ZOFRAN-ODT) 4 MG disintegrating tablet Take 1 tablet (4 mg total) by mouth every 8 (eight) hours as needed for nausea or vomiting.   polyethylene glycol  powder (GLYCOLAX/MIRALAX) powder USE AS DIRECTED   rosuvastatin (CRESTOR) 5 MG tablet Take 1 tablet (5 mg total) by mouth daily.   triamcinolone cream (KENALOG) 0.1 % Apply 1 Application topically 2 (two) times daily.   vitamin C (ASCORBIC ACID) 500 MG tablet Take 500 mg by mouth daily.   No facility-administered encounter medications on file as of 08/19/2023.    Allergies (verified) Alendronate sodium, Boniva [ibandronic acid], Clindamycin/lincomycin, Tramadol, and Penicillins   History: Past Medical History:  Diagnosis Date   Addison's disease (HCC)    Allergy     Arthritis    fingers   Cancer (HCC)    skin   Chicken pox    Colon polyps    Concussion 12/26/2020   Diverticulitis    GERD (gastroesophageal reflux disease)    Past Surgical History:  Procedure Laterality Date   ABDOMINAL HYSTERECTOMY     APPENDECTOMY     BREAST EXCISIONAL BIOPSY Right    2010   BREAST SURGERY     COLONOSCOPY WITH PROPOFOL N/A 07/14/2019   Procedure: COLONOSCOPY WITH PROPOFOL;  Surgeon: [name], MD;  Location: [facility];  Service: Endoscopy;  Laterality: N/A;   ESOPHAGOGASTRODUODENOSCOPY (EGD) WITH PROPOFOL N/A 04/10/2017   Procedure: ESOPHAGOGASTRODUODENOSCOPY (EGD) WITH PROPOFOL;  Surgeon: [name], MD;  Location: [facility];  Service: Endoscopy;  Laterality: N/A;  requeests early as possible   FOOT SURGERY Bilateral    TUBAL LIGATION     varicose veins     Family History  Problem Relation Age of Onset   Cancer Mother 75       Breast Ca  colon Ca (58)  and brain Ca (10)   Breast cancer Mother 75   Cancer Father    Cancer Maternal Aunt        breast   Breast cancer Maternal Aunt    Cancer Maternal Grandmother        breast   Breast cancer Maternal Grandmother    Cancer Maternal Aunt        breast ca   Breast cancer Maternal Aunt    Social History   Socioeconomic History   Marital status: Married    Spouse name: Not on file   Number of children: Not on file   Years of education: Not on file   Highest education level: Master's degree (e.g., MA, MS, MEng, MEd, MSW, MBA)  Occupational History   Not on file  Tobacco Use   Smoking status: Never   Smokeless tobacco: Never  Vaping Use   Vaping status: Never Used  Substance and Sexual Activity   Alcohol use: Yes    Alcohol/week: 7.0 standard drinks of alcohol    Types: 7 Glasses of wine per week    Comment: moderate, social   Drug use: No   Sexual activity: Yes    Birth control/protection: Post-menopausal, Surgical  Other Topics Concern   Not on file  Social History  Narrative   Not on file   Social Determinants of Health   Financial Resource Strain: Low Risk  (08/12/2023)   Overall Financial Resource Strain (CARDIA)    Difficulty of Paying Living Expenses: Not hard at all  Food Insecurity: No Food Insecurity (08/12/2023)   Hunger Vital Sign    Worried About Running Out of Food in the Last Year: Never true    Ran Out of Food in the Last Year: Never true  Transportation Needs: No Transportation Needs (08/12/2023)   PRAPARE - Transportation  Lack of Transportation (Medical): No    Lack of Transportation (Non-Medical): No  Physical Activity: Sufficiently Active (08/12/2023)   Exercise Vital Sign    Days of Exercise per Week: 7 days    Minutes of Exercise per Session: 40 min  Stress: No Stress Concern Present (08/12/2023)   [employer] of Occupational Health - Occupational Stress Questionnaire    Feeling of Stress : Not at all  Social Connections: Socially Integrated (08/12/2023)   Social Connection and Isolation Panel [NHANES]    Frequency of Communication with Friends and Family: More than three times a week    Frequency of Social Gatherings with Friends and Family: More than three times a week    Attends Religious Services: More than 4 times per year    Active Member of [employer] or Organizations: Yes    Attends Engineer, structural: More than 4 times per year    Marital Status: Married    Tobacco Counseling Counseling given: Not Answered   Clinical Intake:  Pre-visit preparation completed: Yes  Pain : No/denies pain     BMI - recorded: 20.25 Nutritional Status: BMI of 19-24  Normal Nutritional Risks: None Diabetes: No  How often do you need to have someone help you when you read instructions, pamphlets, or other written materials from your doctor or pharmacy?: 1 - Never  Interpreter Needed?: No  Information entered by :: R. Eman Morimoto LPN   Activities of Daily Living    08/12/2023    9:19 AM  In your present state of  health, do you have any difficulty performing the following activities:  Hearing? 0  Vision? 0  Difficulty concentrating or making decisions? 0  Walking or climbing stairs? 0  Dressing or bathing? 0  Doing errands, shopping? 0  Preparing Food and eating ? N  Using the Toilet? N  In the past six months, have you accidently leaked urine? N  Do you have problems with loss of bowel control? N  Managing your Medications? N  Managing your Finances? N  Housekeeping or managing your Housekeeping? N    Patient Care Team: Sherlene Shams, MD as PCP - General (Internal Medicine)  Indicate any recent Medical Services you may have received from other than [facility] providers in the past year (date may be approximate).     Assessment:   This is a routine wellness examination for [facility].  Hearing/Vision screen Hearing Screening - Comments:: No issues Vision Screening - Comments:: readers  Dietary issues and exercise activities discussed:     Goals Addressed             This Visit's Progress    Patient Stated       Continue current activities       Depression Screen    08/19/2023    2:59 PM 07/27/2023   11:30 AM 06/15/2023   11:24 AM 06/08/2023   10:54 AM 02/02/2023    9:39 AM 11/04/2022    4:40 PM 07/30/2022    3:31 PM  PHQ 2/9 Scores  PHQ - 2 Score 0 0 0 0 0 0 0  PHQ- 9 Score 0 0 0 0       Fall Risk    08/12/2023    9:19 AM 07/27/2023   11:29 AM 06/15/2023   11:23 AM 02/02/2023    9:39 AM 11/04/2022    4:40 PM  Fall Risk   Falls in the past year? 0 0 0 0 0  Number falls in past  yr: 0 0 0 0   Injury with Fall? 0 0 0 0   Risk for fall due to : No Fall Risks No Fall Risks No Fall Risks No Fall Risks No Fall Risks  Follow up Falls prevention discussed;Falls evaluation completed Falls evaluation completed Falls evaluation completed Falls evaluation completed Falls evaluation completed    MEDICARE RISK AT HOME: Medicare Risk at Home Any stairs in or around the home?: Yes If so,  are there any without handrails?: No Home free of loose throw rugs in walkways, pet beds, electrical cords, etc?: Yes Adequate lighting in your home to reduce risk of falls?: Yes Life alert?: No Use of a cane, walker or w/c?: No Grab bars in the bathroom?: No Shower chair or bench in shower?: Yes Elevated toilet seat or a handicapped toilet?: Yes      07/10/2020    9:41 AM 06/17/2018    3:35 PM  MMSE - Mini Mental State Exam  Not completed: Unable to complete   Orientation to time  5  Orientation to Place  5  Registration  3  Attention/ Calculation  5  Recall  3  Language- name 2 objects  2  Language- repeat  1  Language- follow 3 step command  3  Language- read & follow direction  1  Write a sentence  1  Copy design  1  Total score  30        08/19/2023    3:04 PM 06/20/2019   11:27 AM  6CIT Screen  What Year? 0 points 0 points  What month? 0 points 0 points  What time? 0 points 0 points  Count back from 20 0 points 0 points  Months in reverse 0 points 0 points  Repeat phrase 0 points 0 points  Total Score 0 points 0 points    Immunizations Immunization History  Administered Date(s) Administered   Fluad Quad(high Dose 65+) 09/16/2019   Influenza Split 09/21/2013, 09/19/2015, 08/30/2017   Influenza, High Dose Seasonal PF 10/08/2020   Influenza,inj,Quad PF,6+ Mos 09/11/2014   Influenza-Unspecified 08/22/2018, 09/19/2021, 09/19/2022   PFIZER(Purple Top)SARS-COV-2 Vaccination 01/13/2020, 02/03/2020, 09/23/2020   Pneumococcal Conjugate-13 09/11/2014   Pneumococcal Polysaccharide-23 03/08/2012, 03/30/2018   Tdap 03/09/2011, 03/08/2012, 03/17/2022   Zoster Recombinant(Shingrix) 08/24/2018, 11/19/2018   Zoster, Live 03/08/2012    TDAP status: Up to date  Flu Vaccine status: Up to date  Pneumococcal vaccine status: Up to date  Covid-19 vaccine status: Completed vaccines  Qualifies for Shingles Vaccine? Yes   Zostavax completed Yes   Shingrix Completed?:  Yes  Screening Tests Health Maintenance  Topic Date Due   COVID-19 Vaccine (4 - 2023-24 season) 08/22/2022   Medicare Annual Wellness (AWV)  07/31/2023   INFLUENZA VACCINE  03/21/2024 (Originally 07/23/2023)   MAMMOGRAM  02/23/2024   Colonoscopy  07/13/2024   DTaP/Tdap/Td (4 - Td or Tdap) 03/17/2032   Pneumonia Vaccine 48+ Years old  Completed   DEXA SCAN  Completed   Hepatitis C Screening  Completed   Zoster Vaccines- Shingrix  Completed   HPV VACCINES  Aged Out    Health Maintenance  Health Maintenance Due  Topic Date Due   COVID-19 Vaccine (4 - 2023-24 season) 08/22/2022   Medicare Annual Wellness (AWV)  07/31/2023    Colorectal cancer screening: Type of screening: Colonoscopy. Completed 7/20. Repeat every 5 years  3/24  Bone Density status: Completed 2/24. Results reflect: Bone density results: OSTEOPENIA. Repeat every 2 years.  Lung Cancer Screening: (Low Dose CT  Chest recommended if Age 33-80 years, 20 pack-year currently smoking OR have quit w/in 15years.) does not qualify.     Additional Screening:  Hepatitis C Screening: does qualify; Completed 3/17  Vision Screening: Recommended annual ophthalmology exams for early detection of glaucoma and other disorders of the eye. Is the patient up to date with their annual eye exam?  Yes  Who is the provider or what is the name of the office in which the patient attends annual eye exams? [facility] If pt is not established with a provider, would they like to be referred to a provider to establish care? No .   Dental Screening: Recommended annual dental exams for proper oral hygiene  Community Resource Referral / Chronic Care Management: CRR required this visit?  No   CCM required this visit?  No     Plan:     I have personally reviewed and noted the following in the patient's chart:   Medical and social history Use of alcohol, tobacco or illicit drugs  Current medications and supplements including opioid  prescriptions. Patient is not currently taking opioid prescriptions. Functional ability and status Nutritional status Physical activity Advanced directives List of other physicians Hospitalizations, surgeries, and ER visits in previous 12 months Vitals Screenings to include cognitive, depression, and falls Referrals and appointments  In addition, I have reviewed and discussed with patient certain preventive protocols, quality metrics, and best practice recommendations. A written personalized care plan for preventive services as well as general preventive health recommendations were provided to patient.     Sydell Axon, LPN   [id number]   After Visit Summary: (MyChart) Due to this being a telephonic visit, the after visit summary with patients personalized plan was offered to patient via MyChart   Nurse Notes: None     I have reviewed the above information and agree with above.   Duncan Dull, MD

## 2023-09-22 ENCOUNTER — Ambulatory Visit: Payer: Medicare PPO | Admitting: Family Medicine

## 2023-09-22 ENCOUNTER — Encounter: Payer: Self-pay | Admitting: Family Medicine

## 2023-09-22 ENCOUNTER — Ambulatory Visit
Admission: RE | Admit: 2023-09-22 | Discharge: 2023-09-22 | Disposition: A | Discharge status: Home / Self Care | Payer: Medicare PPO | Source: Ambulatory Visit | Attending: Family Medicine | Admitting: Family Medicine

## 2023-09-22 ENCOUNTER — Ambulatory Visit (INDEPENDENT_AMBULATORY_CARE_PROVIDER_SITE_OTHER): Payer: Medicare PPO

## 2023-09-22 VITALS — BP 98/64 | HR 73 | Temp 98.0°F | Resp 16 | Ht 64.0 in | Wt 121.5 lb

## 2023-09-22 DIAGNOSIS — S300XXA Contusion of lower back and pelvis, initial encounter: Secondary | ICD-10-CM | POA: Diagnosis not present

## 2023-09-22 DIAGNOSIS — W19XXXA Unspecified fall, initial encounter: Secondary | ICD-10-CM | POA: Insufficient documentation

## 2023-09-22 DIAGNOSIS — S0990XA Unspecified injury of head, initial encounter: Secondary | ICD-10-CM | POA: Diagnosis not present

## 2023-09-22 DIAGNOSIS — S6392XA Sprain of unspecified part of left wrist and hand, initial encounter: Secondary | ICD-10-CM

## 2023-09-22 DIAGNOSIS — M47816 Spondylosis without myelopathy or radiculopathy, lumbar region: Secondary | ICD-10-CM | POA: Diagnosis not present

## 2023-09-22 DIAGNOSIS — T148XXA Other injury of unspecified body region, initial encounter: Secondary | ICD-10-CM | POA: Diagnosis not present

## 2023-09-22 DIAGNOSIS — M160 Bilateral primary osteoarthritis of hip: Secondary | ICD-10-CM | POA: Diagnosis not present

## 2023-09-22 DIAGNOSIS — I6782 Cerebral ischemia: Secondary | ICD-10-CM | POA: Diagnosis not present

## 2023-09-22 DIAGNOSIS — Z043 Encounter for examination and observation following other accident: Secondary | ICD-10-CM | POA: Insufficient documentation

## 2023-09-22 DIAGNOSIS — M4316 Spondylolisthesis, lumbar region: Secondary | ICD-10-CM | POA: Diagnosis not present

## 2023-09-22 NOTE — Assessment & Plan Note (Signed)
Fall with Head Trauma Patient reports falling backwards onto a fence, hitting her head and neck. She reports feeling "woozy" and nauseous, with a headache yesterday but not today. No visual changes reported. Concern for concussion given the mechanism of injury and symptoms. -Refer for CT scan of the head to rule out any significant injury.

## 2023-09-22 NOTE — Progress Notes (Addendum)
SUBJECTIVE:   Chief Complaint  Patient presents with   Fall    Had a fall yesterday hit head on fence   HPI The patient, an active individual with a history of pickleball playing, presents with multiple complaints following a fall during a game. She reports falling backwards into a fence, with her head and neck making contact with the fence. She also fell on her bottom and used her hand to break the fall.  Following the fall, the patient noticed a large, purple-black bruise on her bottom, extending from the lower back to the upper thigh. She describes the area as sore but not painful, and it was swollen initially but has since reduced after applying ice packs.  The patient also reports discomfort in her hand, particularly when lifting objects. She denies any pain at rest or with movement, suggesting a possible sprain.  In addition to the physical injuries, the patient has been feeling unwell since the fall. She describes feeling woozy and slightly nauseous, although she has not vomited. She also reports a headache that was present the day of the fall but has since resolved. She denies any visual changes or back pain but mentions some neck discomfort.  The patient takes a low dose aspirin once a week, although she is unsure why. She denies taking any other medications.  The patient's symptoms, particularly the nausea and headache, raise concerns about a possible concussion. The large bruise on her bottom may also indicate a hematoma or fracture. Further imaging is needed to evaluate these possibilities.  Medication - Low-dose aspirin (once a week)  PERTINENT PMH / PSH: As above  OBJECTIVE:  BP 98/64   Pulse 73   Temp 98 F (36.7 C)   Resp 16   Ht 5\' 4"  (1.626 m)   Wt 121 lb 8 oz (55.1 kg)   SpO2 97%   BMI 20.86 kg/m    Physical Exam Musculoskeletal:     Cervical back: Normal.     Thoracic back: Normal.     Lumbar back: Normal.       Legs:     Comments: Hematoma on left  lower extremity, purple-black, extending along left gluteal area, warm to touch compared to contralateral side, without pain on palpation.  Neurological:     General: No focal deficit present.     Mental Status: She is oriented to person, place, and time. Mental status is at baseline.     Sensory: No sensory deficit.     Motor: No weakness.     Gait: Gait normal.     Deep Tendon Reflexes: Reflexes normal.  Psychiatric:        Mood and Affect: Mood normal.        Behavior: Behavior normal.        Thought Content: Thought content normal.        Judgment: Judgment normal.        09/22/2023   11:46 AM 08/19/2023    2:59 PM 07/27/2023   11:30 AM 06/15/2023   11:24 AM 06/08/2023   10:54 AM  Depression screen PHQ 2/9  Decreased Interest 0 0 0 0 0  Down, Depressed, Hopeless 0 0 0 0 0  PHQ - 2 Score 0 0 0 0 0  Altered sleeping 0 0 0 0 0  Tired, decreased energy 0 0 0 0 0  Change in appetite 0 0 0 0 0  Feeling bad or failure about yourself  0 0 0 0 0  Trouble concentrating 0 0 0 0 0  Moving slowly or fidgety/restless 0 0 0 0 0  Suicidal thoughts 0 0 0 0 0  PHQ-9 Score 0 0 0 0 0  Difficult doing work/chores Not difficult at all Not difficult at all Not difficult at all Not difficult at all Not difficult at all      09/22/2023   11:47 AM 06/08/2023   10:54 AM  GAD 7 : Generalized Anxiety Score  Nervous, Anxious, on Edge 0 0  Control/stop worrying 0 0  Worry too much - different things 0 0  Trouble relaxing 0 0  Restless 0 0  Easily annoyed or irritable 0 0  Afraid - awful might happen 0 0  Total GAD 7 Score 0 0  Anxiety Difficulty Not difficult at all Not difficult at all    ASSESSMENT/PLAN:  Fall, initial encounter Assessment & Plan: Fall with Head Trauma Patient reports falling backwards onto a fence, hitting her head and neck. She reports feeling "woozy" and nauseous, with a headache yesterday but not today. No visual changes reported. Concern for concussion given the  mechanism of injury and symptoms. -Refer for CT scan of the head to rule out any significant injury.  Orders: -     CT HEAD WO CONTRAST ( ); Future -     DG Sacrum/Coccyx; Future  Hand sprain, left, initial encounter Assessment & Plan: Patient reports pain in the left hand when lifting objects, but no pain at rest. No reported deformity or loss of function. -Consider imaging if pain persists or function decreases.   Hematoma Assessment & Plan: Left Gluteal Hematoma Patient fell onto left buttock, resulting in a large, warm hematoma. No reported fractures or significant pain. -Monitor for resolution. If pain increases or hematoma does not improve, consider imaging. -Will obtain xray of coccyx today   Injury of head, initial encounter -     CT HEAD WO CONTRAST ( ); Future   PDMP reviewed  Return if symptoms worsen or fail to improve, for PCP.  Dana Allan, MD

## 2023-09-22 NOTE — Assessment & Plan Note (Addendum)
Patient reports pain in the left hand when lifting objects, but no pain at rest. No reported deformity or loss of function. -Consider imaging if pain persists or function decreases.

## 2023-09-22 NOTE — Assessment & Plan Note (Signed)
Left Gluteal Hematoma Patient fell onto left buttock, resulting in a large, warm hematoma. No reported fractures or significant pain. -Monitor for resolution. If pain increases or hematoma does not improve, consider imaging. -Will obtain xray of coccyx today

## 2023-09-22 NOTE — Patient Instructions (Addendum)
It was a pleasure meeting you today. Thank you for allowing me to take part in your health care.  Our goals for today as we discussed include:  I think you have a mild concussion Will get CT head for evaluation  Left hand sprain.  Heat/Ice as needed.  Monitor for worsening symptoms and if no improvement will obtain imaging  Hematoma of left buttock. Likely will take some time to resolve and does not require treatment.  If develops pain, fevers or increased swelling please notify MD.  Follow up with PCP if symptoms worsen.  If you have any questions or concerns, please do not hesitate to call the office at [phone].  I look forward to our next visit and until then take care and stay safe.  Regards,   Dana Allan, MD   [facility]

## 2023-10-05 DIAGNOSIS — S0990XA Unspecified injury of head, initial encounter: Secondary | ICD-10-CM | POA: Insufficient documentation

## 2023-10-05 HISTORY — DX: Unspecified injury of head, initial encounter: S09.90XA

## 2023-10-20 IMAGING — MG MM DIGITAL SCREENING BILAT W/ TOMO AND CAD
8 series · 9 of 24 positions shown · non-contrast
Comparison: Previous exam(s).

CLINICAL DATA: Screening.

EXAM:
DIGITAL SCREENING BILATERAL MAMMOGRAM WITH TOMOSYNTHESIS AND CAD
TECHNIQUE: Bilateral screening digital craniocaudal and mediolateral oblique
mammograms were obtained. Bilateral screening digital breast
tomosynthesis was performed. The images were evaluated with
computer-aided detection.

[L MLO synth-2D]
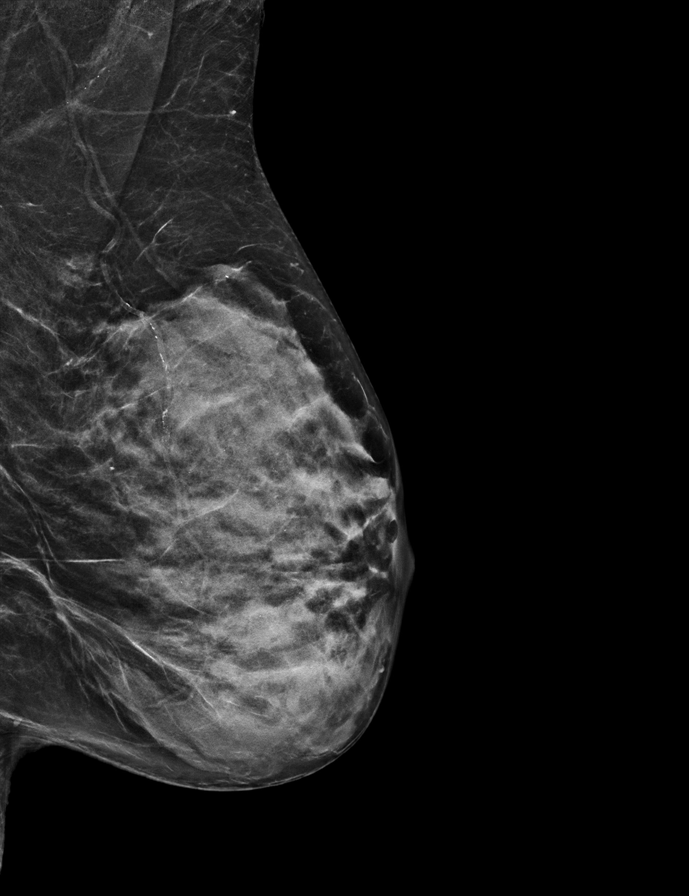

[L CC synth-2D]
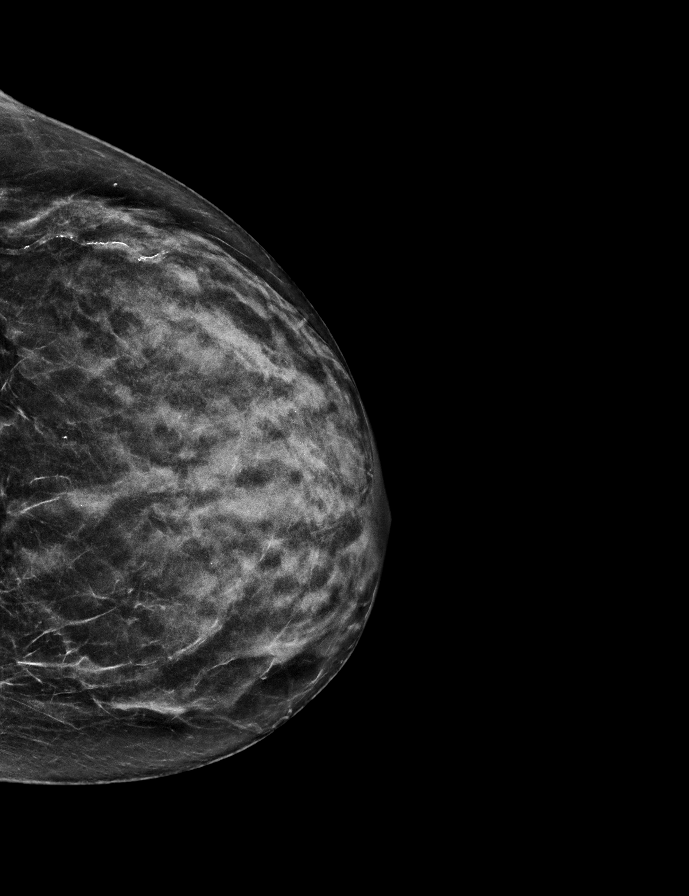

[R MLO synth-2D]
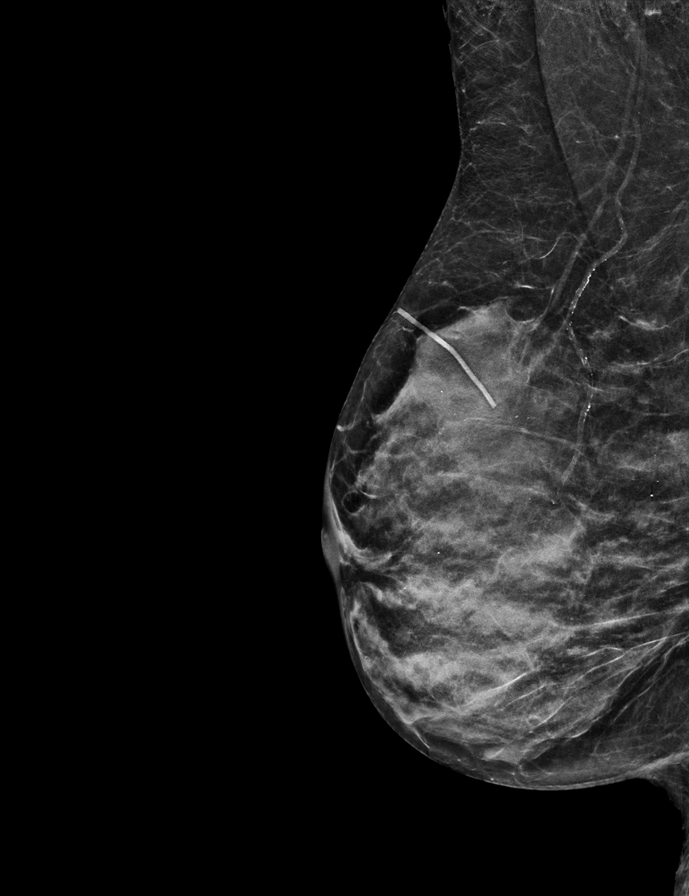

[R CC synth-2D]
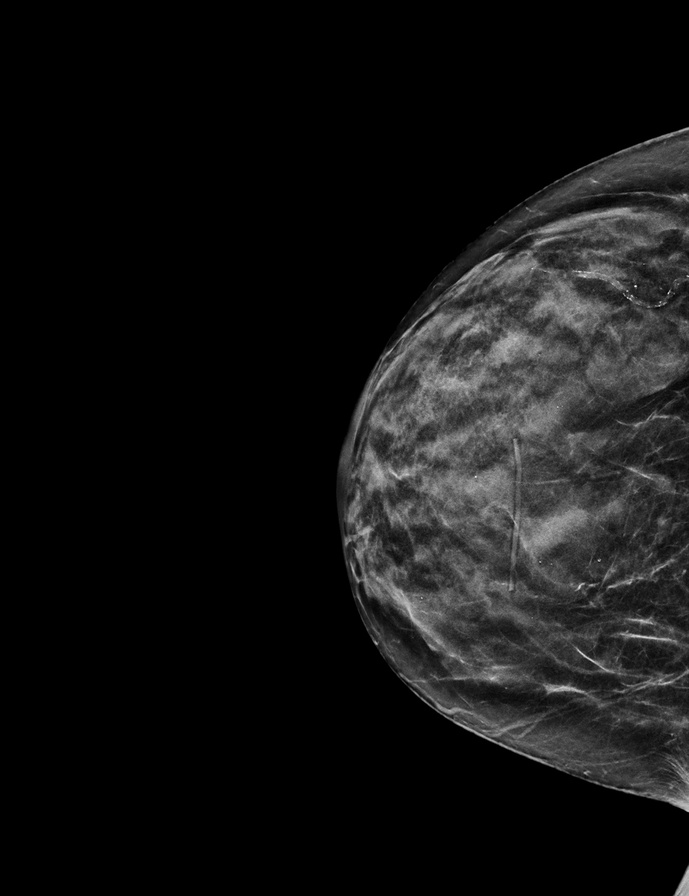

[R CC tomo · 2 of 47 frames shown]
[frame 16/47]
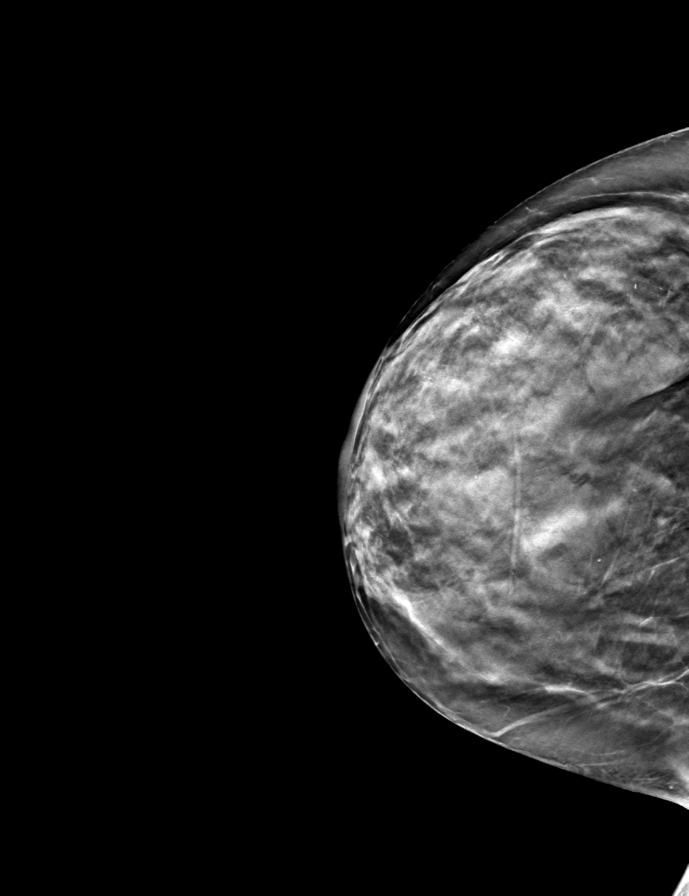
[frame 24/47]
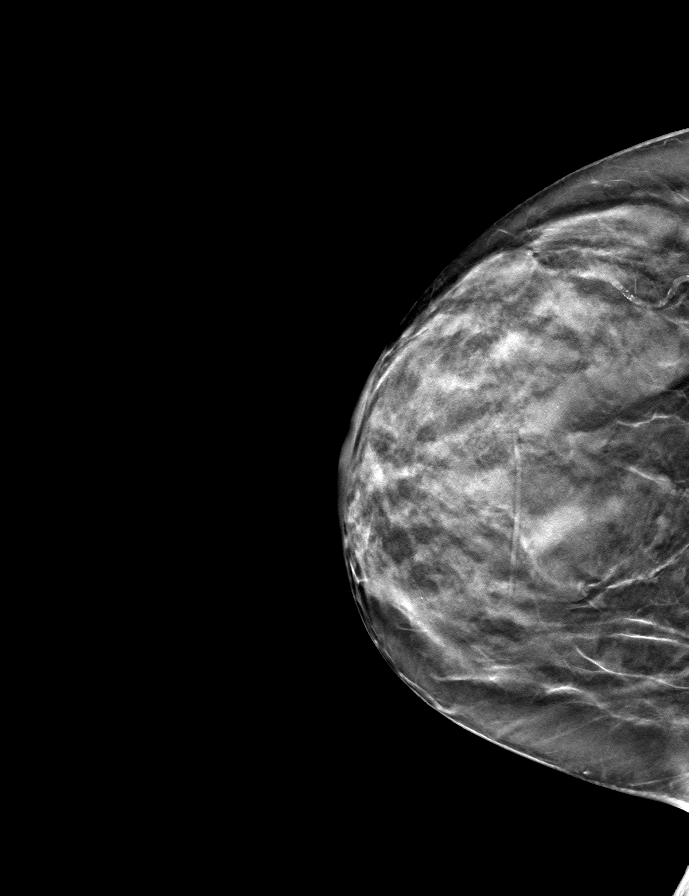

[R MLO tomo · tomo slice 25/48.0]
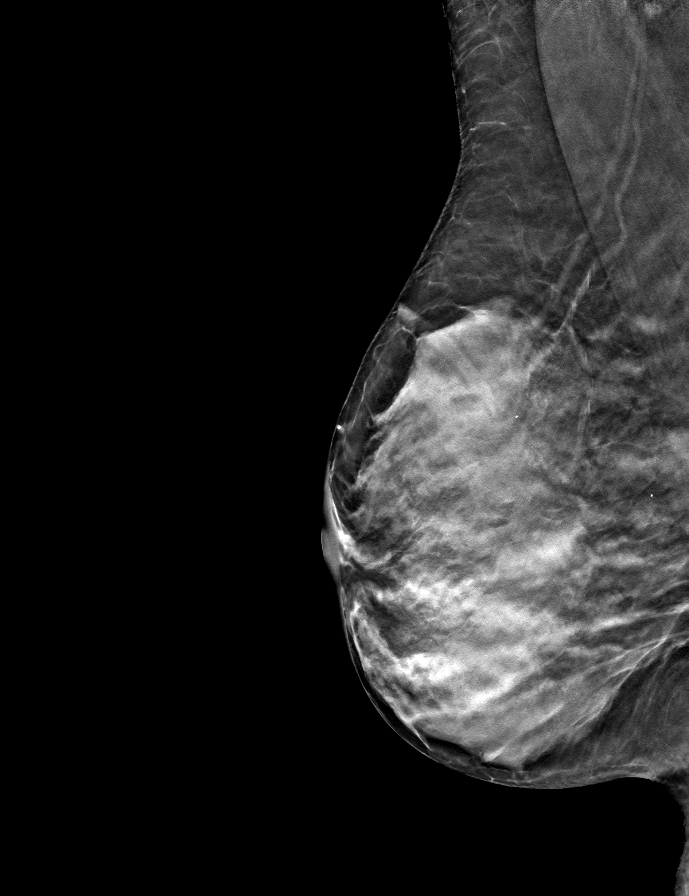

[L MLO tomo · tomo slice 28/55.0]
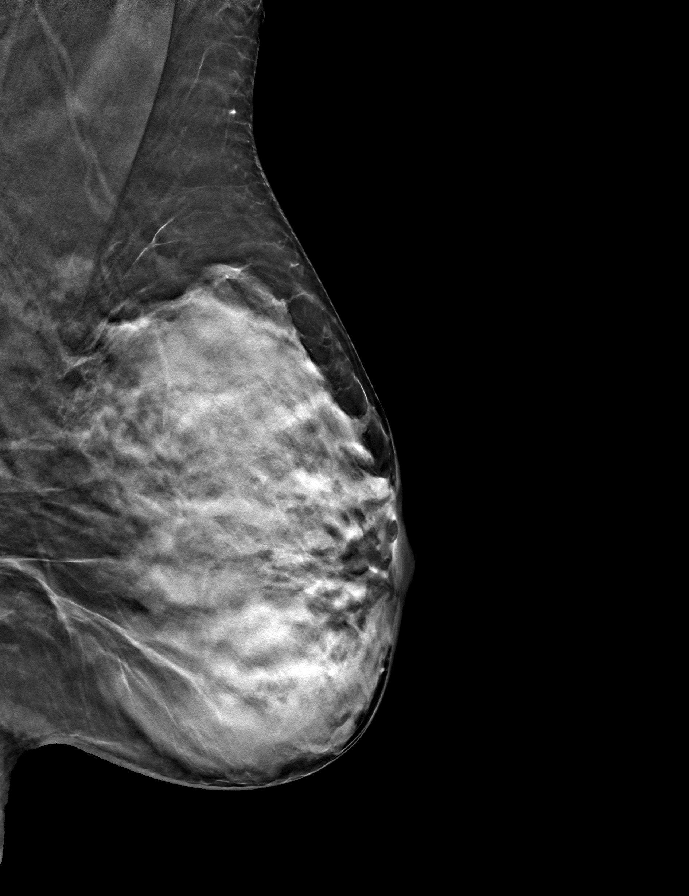

[L CC tomo · tomo slice 25/50.0]
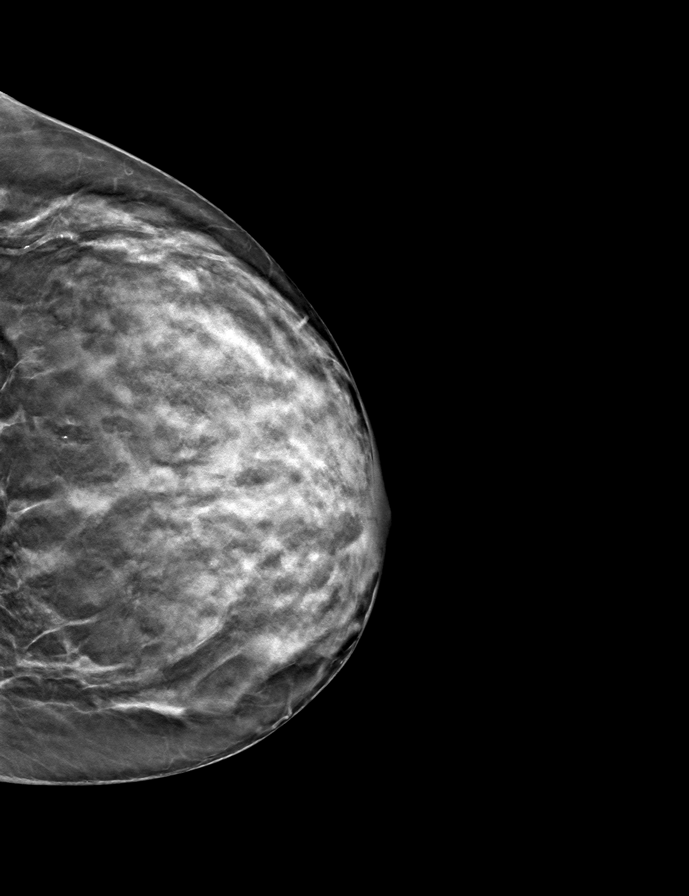

[9 of 24 positions shown; findings below may reference images not displayed]

ACR Breast Density Category d: The breast tissue is extremely dense,
which lowers the sensitivity of mammography
FINDINGS: There are no findings suspicious for malignancy.
IMPRESSION: No mammographic evidence of malignancy. A result letter of this
screening mammogram will be mailed directly to the patient.

RECOMMENDATION:
Screening mammogram in one year. (Code:[id number]-[id number])

BI-RADS CATEGORY  1: Negative.

## 2023-10-23 ENCOUNTER — Telehealth: Payer: Self-pay | Admitting: Internal Medicine

## 2023-10-23 NOTE — Telephone Encounter (Signed)
Pt called stating she fell while playing pickerball on her bottom and she saw Dr. Clent Ridges on 10/1 but she is still having pain after five weeks

## 2023-10-23 NOTE — Telephone Encounter (Signed)
Pt called and stated that she was advised by Dr. Clent Ridges that if she was still having discomfort in her buttocks from the fall after a month to please give our office a call to schedule an appointment. Would it be okay to schedule pt for Monday at 5 pm in person or virtual?

## 2023-10-26 NOTE — Telephone Encounter (Signed)
Pt has been scheduled for Wednesday at 5 pm in person. Pt is aware to arrive at 4:45 for check in.

## 2023-10-28 ENCOUNTER — Encounter: Payer: Self-pay | Admitting: Internal Medicine

## 2023-10-28 ENCOUNTER — Ambulatory Visit: Payer: Medicare PPO | Admitting: Internal Medicine

## 2023-10-28 VITALS — BP 112/68 | HR 86 | Temp 98.1°F | Ht 64.0 in | Wt 120.2 lb

## 2023-10-28 DIAGNOSIS — M7072 Other bursitis of hip, left hip: Secondary | ICD-10-CM | POA: Diagnosis not present

## 2023-10-28 MED ORDER — CELECOXIB 200 MG PO CAPS: 200.0000 mg | ORAL_CAPSULE | Freq: Two times a day (BID) | ORAL | 1 refills | Status: DC

## 2023-10-28 NOTE — Patient Instructions (Addendum)
Ischial bursitis   Celebrex  twice daily for 1 week, then once daily  for 3 weeks  No advil or aleve,  or voltaren while taking this medication   Use ice  .  Avoid excessive walking  . Upper body exercises ok  If no improvement by 4 weeks.  Veronica Ferrell

## 2023-10-28 NOTE — Progress Notes (Addendum)
Subjective:  Patient ID: Veronica Ferrell, female    DOB: August 17, 1951  Age: 72 y.o. MRN: [id number]  CC: The encounter diagnosis was Ischial bursitis of left side.   HPI TINEA NOBILE presents for  Chief Complaint  Patient presents with  . Acute Visit    Still having pain from fall after 5 weeks   Pain in left buttock after falling during a pickle ball game over 5 weeks ago.  Entire buttock became bruised and she developed a hematoma on her "butt bone"  .  Pain has improved but not resolved,  hurts to sit down . Has been avoiding exercise but walking which aggravates her pain.  Rated as 2/10 currently ,     Outpatient Medications Prior to Visit  Medication Sig Dispense Refill  . ALPRAZolam (XANAX) 1 MG tablet TAKE 1 TABLET BY MOUTH AT BEDTIME AS NEEDED FOR ANXIETY 30 tablet 5  . aspirin 81 MG tablet Take 81 mg by mouth once a week.     . calcium citrate (CALCITRATE - DOSED IN MG ELEMENTAL CALCIUM) 950 MG tablet Take 1 tablet by mouth daily.    . cholecalciferol (VITAMIN D) 25 MCG (1000 UNIT) tablet Take 3 tablets (3,000 Units total) by mouth daily. 90 tablet 1  . diclofenac Sodium (VOLTAREN) 1 % GEL APPLY 2 GRAMS TOPICALLY FOUR TIMES DAILY 100 g 2  . estradiol (ESTRACE) 1 MG tablet Take 1 tablet (1 mg total) by mouth daily. 90 tablet 3  . fludrocortisone (FLORINEF) 0.1 MG tablet Take 1 tablet (100 mcg total) by mouth daily. 90 tablet 3  . hydrocortisone (CORTEF) 20 MG tablet TAKE ONE TABLET BY MOUTH EVERY MORNING AND TAKE ONE-HALF TABLET BY MOUTH EVERY NIGHT AS DIRECTED 135 tablet 2  . ipratropium (ATROVENT) 0.06 % nasal spray USE 2 SPRAYS IN BOTH NOSTRILS FOUR TIMESDAILY AS DIRECTED. 15 mL 2  . Lactulose 20 GM/30ML SOLN 30 ml every 4 hours until constipation is relieved 237 mL 1  . mirtazapine (REMERON) 7.5 MG tablet Take 1 tablet (7.5 mg total) by mouth at bedtime. 90 tablet 1  . Multiple Vitamins-Minerals (MULTIVITAMIN WITH MINERALS) tablet Take 1 tablet by mouth daily.    Marland Kitchen  omeprazole (PRILOSEC) 40 MG capsule TAKE 1 CAPSULE BY MOUTH EVERY DAY 90 capsule 3  . ondansetron (ZOFRAN-ODT) 4 MG disintegrating tablet Take 1 tablet (4 mg total) by mouth every 8 (eight) hours as needed for nausea or vomiting. 20 tablet 0  . polyethylene glycol powder (GLYCOLAX/MIRALAX) powder USE AS DIRECTED 527 g 1  . rosuvastatin (CRESTOR) 5 MG tablet Take 1 tablet (5 mg total) by mouth daily. 90 tablet 3  . triamcinolone cream (KENALOG) 0.1 % Apply 1 Application topically 2 (two) times daily. 453.6 g 0  . vitamin C (ASCORBIC ACID) 500 MG tablet Take 500 mg by mouth daily.     No facility-administered medications prior to visit.    Review of Systems;  Patient denies headache, fevers, malaise, unintentional weight loss, skin rash, eye pain, sinus congestion and sinus pain, sore throat, dysphagia,  hemoptysis , cough, dyspnea, wheezing, chest pain, palpitations, orthopnea, edema, abdominal pain, nausea, melena, diarrhea, constipation, flank pain, dysuria, hematuria, urinary  Frequency, nocturia, numbness, tingling, seizures,  Focal weakness, Loss of consciousness,  Tremor, insomnia, depression, anxiety, and suicidal ideation.      Objective:  BP 112/68   Pulse 86   Temp 98.1 F (36.7 C)   Ht 5\' 4"  (1.626 m)   Wt 120 lb  3.2 oz (54.5 kg)   SpO2 97%   BMI 20.63 kg/m   BP Readings from Last 3 Encounters:  10/28/23 112/68  09/22/23 98/64  07/27/23 (!) 84/62    Wt Readings from Last 3 Encounters:  10/28/23 120 lb 3.2 oz (54.5 kg)  09/22/23 121 lb 8 oz (55.1 kg)  08/19/23 118 lb (53.5 kg)    Physical Exam Vitals reviewed.  Constitutional:      General: She is not in acute distress.    Appearance: Normal appearance. She is normal weight. She is not ill-appearing, toxic-appearing or diaphoretic.  HENT:     Head: Normocephalic.  Eyes:     General: No scleral icterus.       Right eye: No discharge.        Left eye: No discharge.     Conjunctiva/sclera: Conjunctivae normal.   Cardiovascular:     Rate and Rhythm: Normal rate and regular rhythm.     Heart sounds: Normal heart sounds.  Pulmonary:     Effort: Pulmonary effort is normal. No respiratory distress.     Breath sounds: Normal breath sounds.  Musculoskeletal:        General: Tenderness present. Normal range of motion.     Cervical back: Normal.     Thoracic back: Normal.     Lumbar back: Bony tenderness present.       Back:  Skin:    General: Skin is warm and dry.  Neurological:     General: No focal deficit present.     Mental Status: She is alert and oriented to person, place, and time. Mental status is at baseline.  Psychiatric:        Mood and Affect: Mood normal.        Behavior: Behavior normal.        Thought Content: Thought content normal.        Judgment: Judgment normal.   Lab Results  Component Value Date   HGBA1C 6.0 07/22/2023   HGBA1C 6.1 01/30/2023   HGBA1C 6.3 07/14/2022    Lab Results  Component Value Date   CREATININE 0.83 07/27/2023   CREATININE 0.99 07/22/2023   CREATININE 0.93 06/15/2023    Lab Results  Component Value Date   WBC 9.1 06/08/2023   HGB 14.2 06/08/2023   HCT 42.8 06/08/2023   PLT 399.0 06/08/2023   GLUCOSE 102 (H) 07/27/2023   CHOL 212 (H) 07/22/2023   TRIG 81.0 07/22/2023   HDL 106.80 07/22/2023   LDLDIRECT 73.0 07/22/2023   LDLCALC 89 07/22/2023   ALT 13 07/22/2023   AST 18 07/22/2023   NA 129 (L) 07/27/2023   K 5.1 07/27/2023   CL 95 (L) 07/27/2023   CREATININE 0.83 07/27/2023   BUN 19 07/27/2023   CO2 24 07/27/2023   TSH 3.68 01/30/2023   HGBA1C 6.0 07/22/2023   MICROALBUR <0.7 01/30/2023    CT HEAD WO CONTRAST ( )  Result Date: 09/22/2023 CLINICAL DATA:  Head trauma, minor (Age >= 65y) EXAM: CT HEAD WITHOUT CONTRAST TECHNIQUE: Contiguous axial images were obtained from the base of the skull through the vertex without intravenous contrast. RADIATION DOSE REDUCTION: This exam was performed according to the departmental  dose-optimization program which includes automated exposure control, adjustment of the mA and/or kV according to patient size and/or use of iterative reconstruction technique. COMPARISON:  CT Head 01/03/21 FINDINGS: Brain: No evidence of acute infarction, hemorrhage, hydrocephalus, extra-axial collection or mass lesion/mass effect. Sequela of mild chronic microvascular ischemic change Vascular:  No hyperdense vessel or unexpected calcification. Skull: Normal. Negative for fracture or focal lesion. Sinuses/Orbits: No middle ear or mastoid effusion. Paranasal sinuses are clear. Orbits are unremarkable. Other: None. IMPRESSION: No acute intracranial abnormality. Electronically Signed   By: Lorenza Cambridge M.D.   On: 09/22/2023 14:25   DG Sacrum/Coccyx  Result Date: 09/22/2023 CLINICAL DATA:  Fall.  Left buttocks hematoma. EXAM: SACRUM AND COCCYX - 2+ VIEW COMPARISON:  Abdominal radiograph 06/08/2023. CT of the abdomen and pelvis 06/15/2023. FINDINGS: The mineralization and alignment are normal. There is no evidence of acute fracture or dislocation. The sacroiliac joints are intact. Stable minimal degenerative changes of both hips. Lower lumbar facet arthropathy and a grade 1 anterolisthesis at the L4-5 and L5-S1 levels are similar to previous CT. IMPRESSION: No evidence of acute sacrococcygeal injury. Stable lower lumbar spondylosis. Electronically Signed   By: Carey Bullocks M.D.   On: 09/22/2023 14:13    Assessment & Plan:  .Ischial bursitis of left side Assessment & Plan: Post traumatic.  Celebrex, ice and rest.  Orthopedics referral iif no improvement in 3 weeks    Other orders -     Celecoxib; Take 1 capsule (200 mg total) by mouth 2 (two) times daily.  Dispense: 60 capsule; Refill: 1     I provided 30 minutes of face-to-face time during this encounter reviewing patient's last visit with me, patient's  most recent visit with cardiology,  nephrology,  and neurology,  recent surgical and non surgical  procedures, previous  labs and imaging studies, counseling on currently addressed issues,  and post visit ordering to diagnostics and therapeutics .   Follow-up: No follow-ups on file.   Sherlene Shams, MD

## 2023-10-29 DIAGNOSIS — M7072 Other bursitis of hip, left hip: Secondary | ICD-10-CM | POA: Insufficient documentation

## 2023-10-29 NOTE — Assessment & Plan Note (Signed)
Post traumatic.  Celebrex, ice and rest.  Orthopedics referral iif no improvement in 3 weeks

## 2023-11-12 DIAGNOSIS — H33311 Horseshoe tear of retina without detachment, right eye: Secondary | ICD-10-CM | POA: Diagnosis not present

## 2023-11-12 DIAGNOSIS — H43813 Vitreous degeneration, bilateral: Secondary | ICD-10-CM | POA: Diagnosis not present

## 2023-11-12 DIAGNOSIS — H2513 Age-related nuclear cataract, bilateral: Secondary | ICD-10-CM | POA: Diagnosis not present

## 2023-11-17 ENCOUNTER — Other Ambulatory Visit: Payer: Self-pay | Admitting: Internal Medicine

## 2023-11-23 DIAGNOSIS — M533 Sacrococcygeal disorders, not elsewhere classified: Secondary | ICD-10-CM | POA: Diagnosis not present

## 2024-01-18 ENCOUNTER — Ambulatory Visit: Payer: Medicare PPO | Admitting: Internal Medicine

## 2024-01-18 ENCOUNTER — Encounter: Payer: Self-pay | Admitting: Internal Medicine

## 2024-01-18 VITALS — BP 102/72 | HR 78 | Ht 64.0 in | Wt 119.2 lb

## 2024-01-18 DIAGNOSIS — E559 Vitamin D deficiency, unspecified: Secondary | ICD-10-CM

## 2024-01-18 DIAGNOSIS — K219 Gastro-esophageal reflux disease without esophagitis: Secondary | ICD-10-CM | POA: Diagnosis not present

## 2024-01-18 DIAGNOSIS — Z7184 Encounter for health counseling related to travel: Secondary | ICD-10-CM | POA: Diagnosis not present

## 2024-01-18 DIAGNOSIS — K222 Esophageal obstruction: Secondary | ICD-10-CM

## 2024-01-18 DIAGNOSIS — Z860100 Personal history of colon polyps, unspecified: Secondary | ICD-10-CM | POA: Diagnosis not present

## 2024-01-18 DIAGNOSIS — Z1231 Encounter for screening mammogram for malignant neoplasm of breast: Secondary | ICD-10-CM

## 2024-01-18 DIAGNOSIS — R7303 Prediabetes: Secondary | ICD-10-CM | POA: Diagnosis not present

## 2024-01-18 DIAGNOSIS — R5383 Other fatigue: Secondary | ICD-10-CM

## 2024-01-18 DIAGNOSIS — F5104 Psychophysiologic insomnia: Secondary | ICD-10-CM | POA: Diagnosis not present

## 2024-01-18 DIAGNOSIS — K2100 Gastro-esophageal reflux disease with esophagitis, without bleeding: Secondary | ICD-10-CM

## 2024-01-18 DIAGNOSIS — E785 Hyperlipidemia, unspecified: Secondary | ICD-10-CM | POA: Diagnosis not present

## 2024-01-18 MED ORDER — ESTRADIOL 1 MG PO TABS: 1.0000 mg | ORAL_TABLET | Freq: Every day | ORAL | 3 refills | Status: AC

## 2024-01-18 MED ORDER — ROSUVASTATIN CALCIUM 5 MG PO TABS: 5.0000 mg | ORAL_TABLET | Freq: Every day | ORAL | 3 refills | Status: DC

## 2024-01-18 MED ORDER — PROMETHAZINE HCL 12.5 MG PO TABS: 12.5000 mg | ORAL_TABLET | Freq: Three times a day (TID) | ORAL | 0 refills | Status: AC | PRN

## 2024-01-18 MED ORDER — OMEPRAZOLE 40 MG PO CPDR: 40.0000 mg | DELAYED_RELEASE_CAPSULE | Freq: Two times a day (BID) | ORAL | 0 refills | Status: DC

## 2024-01-18 MED ORDER — TRIAMCINOLONE ACETONIDE 0.1 % EX CREA: 1.0000 | TOPICAL_CREAM | Freq: Two times a day (BID) | CUTANEOUS | 0 refills | Status: DC

## 2024-01-18 MED ORDER — LEVOFLOXACIN 500 MG PO TABS: 500.0000 mg | ORAL_TABLET | Freq: Every day | ORAL | 0 refills | Status: AC

## 2024-01-18 NOTE — Assessment & Plan Note (Signed)
Patient is travelling internationally soon and requires empiric antibotics ,  steroids and anti emetics to prevent any infections from becoming life threatening given her Addison's disease

## 2024-01-18 NOTE — Patient Instructions (Addendum)
Try using a salon pas patch with 4% lidocaine on your bottom ,  and make sure you sit on a gel pad or inflatable ring/seat during your drive    Nocturnal reflux is managed with   1) wedge pillow to elevate torso 15 degrees and prevent the acid from washing into esophagus   2) add 20 mg prilosec before bedtime,  at least 2 hours after eating   3) Do not eat within 2 hours of bedtime.   4) Consider getting the RSV vaccine

## 2024-01-18 NOTE — Assessment & Plan Note (Addendum)
Occurring nocturnally . Advised to increase omeprazole to bid for one month,  changing sleeping position with use of a wedge pillow to elevate torso

## 2024-01-18 NOTE — Assessment & Plan Note (Signed)
She has weaned herself down to 7.5 mg mirtazapine

## 2024-01-18 NOTE — Progress Notes (Signed)
Subjective:  Patient ID: Veronica Ferrell, female    DOB: June 24, 1951  Age: 73 y.o. MRN: [id number]  CC: The primary encounter diagnosis was Encounter for screening mammogram for malignant neoplasm of breast. Diagnoses of GERD with stricture, Fatigue, unspecified type, Prediabetes, Hyperlipidemia, unspecified hyperlipidemia type, History of colonic polyps, Vitamin D deficiency, Gastroesophageal reflux disease with esophagitis without hemorrhage, Chronic insomnia, and Travel advice encounter were also pertinent to this visit.   HPI Veronica Ferrell presents for  Chief Complaint  Patient presents with   Medical Management of Chronic Issues    6 month follow up    1)  right buttock pain;  present for several months after a fall during which she developed a HEMATOMA  over her right SI joint.  Improved marginally over the last several months with celebrex,  but not resolved.  Saw [facility],  reassured that the healing would take months,  and given a steroid taper \   2) nocturnal acid reflux  occurring for the last several weeks.   takes omeprazole in the morning.  Eats dinner by 7pm . Does not lie down before 10 pm   3) Constipation:  using miralax and citrucel daily with no recent issues.    Outpatient Medications Prior to Visit  Medication Sig Dispense Refill   ALPRAZolam (XANAX) 1 MG tablet TAKE 1 TABLET BY MOUTH AT BEDTIME AS NEEDED FOR ANXIETY 30 tablet 5   aspirin 81 MG tablet Take 81 mg by mouth once a week.      calcium citrate (CALCITRATE - DOSED IN MG ELEMENTAL CALCIUM) 950 MG tablet Take 1 tablet by mouth daily.     cholecalciferol (VITAMIN D) 25 MCG (1000 UNIT) tablet Take 3 tablets (3,000 Units total) by mouth daily. 90 tablet 1   diclofenac Sodium (VOLTAREN) 1 % GEL APPLY 2 GRAMS TOPICALLY FOUR TIMES DAILY 100 g 2   fludrocortisone (FLORINEF) 0.1 MG tablet Take 1 tablet (100 mcg total) by mouth daily. 90 tablet 3   hydrocortisone (CORTEF) 20 MG tablet TAKE ONE TABLET BY MOUTH  EVERY MORNING AND TAKE ONE-HALF TABLET BY MOUTH EVERY NIGHT AS DIRECTED 135 tablet 2   ipratropium (ATROVENT) 0.06 % nasal spray USE 2 SPRAYS IN BOTH NOSTRILS FOUR TIMESDAILY AS DIRECTED. 15 mL 2   Lactulose 20 GM/30ML SOLN 30 ml every 4 hours until constipation is relieved 237 mL 1   mirtazapine (REMERON) 7.5 MG tablet TAKE ONE TABLET BY MOUTH AT BEDTIME 90 tablet 1   Multiple Vitamins-Minerals (MULTIVITAMIN WITH MINERALS) tablet Take 1 tablet by mouth daily.     ondansetron (ZOFRAN-ODT) 4 MG disintegrating tablet Take 1 tablet (4 mg total) by mouth every 8 (eight) hours as needed for nausea or vomiting. 20 tablet 0   polyethylene glycol powder (GLYCOLAX/MIRALAX) powder USE AS DIRECTED 527 g 1   vitamin C (ASCORBIC ACID) 500 MG tablet Take 500 mg by mouth daily.     estradiol (ESTRACE) 1 MG tablet Take 1 tablet (1 mg total) by mouth daily. 90 tablet 3   omeprazole (PRILOSEC) 40 MG capsule TAKE 1 CAPSULE BY MOUTH EVERY DAY 90 capsule 3   rosuvastatin (CRESTOR) 5 MG tablet Take 1 tablet (5 mg total) by mouth daily. 90 tablet 3   triamcinolone cream (KENALOG) 0.1 % Apply 1 Application topically 2 (two) times daily. 453.6 g 0   celecoxib (CELEBREX) 200 MG capsule Take 1 capsule (200 mg total) by mouth 2 (two) times daily. (Patient not taking: Reported on 01/18/2024)  60 capsule 1   No facility-administered medications prior to visit.    Review of Systems;  Patient denies headache, fevers, malaise, unintentional weight loss, skin rash, eye pain, sinus congestion and sinus pain, sore throat, dysphagia,  hemoptysis , cough, dyspnea, wheezing, chest pain, palpitations, orthopnea, edema, abdominal pain, nausea, melena, diarrhea, constipation, flank pain, dysuria, hematuria, urinary  Frequency, nocturia, numbness, tingling, seizures,  Focal weakness, Loss of consciousness,  Tremor, insomnia, depression, anxiety, and suicidal ideation.      Objective:  BP 102/72   Pulse 78   Ht 5\' 4"  (1.626 m)   Wt  119 lb 3.2 oz (54.1 kg)   SpO2 97%   BMI 20.46 kg/m   BP Readings from Last 3 Encounters:  01/18/24 102/72  10/28/23 112/68  09/22/23 98/64    Wt Readings from Last 3 Encounters:  01/18/24 119 lb 3.2 oz (54.1 kg)  10/28/23 120 lb 3.2 oz (54.5 kg)  09/22/23 121 lb 8 oz (55.1 kg)    Physical Exam Vitals reviewed.  Constitutional:      General: She is not in acute distress.    Appearance: Normal appearance. She is normal weight. She is not ill-appearing, toxic-appearing or diaphoretic.  HENT:     Head: Normocephalic.  Eyes:     General: No scleral icterus.       Right eye: No discharge.        Left eye: No discharge.     Conjunctiva/sclera: Conjunctivae normal.  Cardiovascular:     Rate and Rhythm: Normal rate and regular rhythm.     Heart sounds: Normal heart sounds.  Pulmonary:     Effort: Pulmonary effort is normal. No respiratory distress.     Breath sounds: Normal breath sounds.  Musculoskeletal:        General: Tenderness present. Normal range of motion.       Legs:     Comments: Point tenderness without bruising or swelling   Skin:    General: Skin is warm and dry.  Neurological:     General: No focal deficit present.     Mental Status: She is alert and oriented to person, place, and time. Mental status is at baseline.  Psychiatric:        Mood and Affect: Mood normal.        Behavior: Behavior normal.        Thought Content: Thought content normal.        Judgment: Judgment normal.    Lab Results  Component Value Date   HGBA1C 6.0 07/22/2023   HGBA1C 6.1 01/30/2023   HGBA1C 6.3 07/14/2022    Lab Results  Component Value Date   CREATININE 0.83 07/27/2023   CREATININE 0.99 07/22/2023   CREATININE 0.93 06/15/2023    Lab Results  Component Value Date   WBC 9.1 06/08/2023   HGB 14.2 06/08/2023   HCT 42.8 06/08/2023   PLT 399.0 06/08/2023   GLUCOSE 102 (H) 07/27/2023   CHOL 212 (H) 07/22/2023   TRIG 81.0 07/22/2023   HDL 106.80 07/22/2023    LDLDIRECT 73.0 07/22/2023   LDLCALC 89 07/22/2023   ALT 13 07/22/2023   AST 18 07/22/2023   NA 129 (L) 07/27/2023   K 5.1 07/27/2023   CL 95 (L) 07/27/2023   CREATININE 0.83 07/27/2023   BUN 19 07/27/2023   CO2 24 07/27/2023   TSH 3.68 01/30/2023   HGBA1C 6.0 07/22/2023   MICROALBUR <0.7 01/30/2023    CT HEAD WO CONTRAST ( ) Result Date:  09/22/2023 CLINICAL DATA:  Head trauma, minor (Age >= 65y) EXAM: CT HEAD WITHOUT CONTRAST TECHNIQUE: Contiguous axial images were obtained from the base of the skull through the vertex without intravenous contrast. RADIATION DOSE REDUCTION: This exam was performed according to the departmental dose-optimization program which includes automated exposure control, adjustment of the mA and/or kV according to patient size and/or use of iterative reconstruction technique. COMPARISON:  CT Head 01/03/21 FINDINGS: Brain: No evidence of acute infarction, hemorrhage, hydrocephalus, extra-axial collection or mass lesion/mass effect. Sequela of mild chronic microvascular ischemic change Vascular: No hyperdense vessel or unexpected calcification. Skull: Normal. Negative for fracture or focal lesion. Sinuses/Orbits: No middle ear or mastoid effusion. Paranasal sinuses are clear. Orbits are unremarkable. Other: None. IMPRESSION: No acute intracranial abnormality. Electronically Signed   By: Lorenza Cambridge M.D.   On: 09/22/2023 14:25   DG Sacrum/Coccyx Result Date: 09/22/2023 CLINICAL DATA:  Fall.  Left buttocks hematoma. EXAM: SACRUM AND COCCYX - 2+ VIEW COMPARISON:  Abdominal radiograph 06/08/2023. CT of the abdomen and pelvis 06/15/2023. FINDINGS: The mineralization and alignment are normal. There is no evidence of acute fracture or dislocation. The sacroiliac joints are intact. Stable minimal degenerative changes of both hips. Lower lumbar facet arthropathy and a grade 1 anterolisthesis at the L4-5 and L5-S1 levels are similar to previous CT. IMPRESSION: No evidence of acute  sacrococcygeal injury. Stable lower lumbar spondylosis. Electronically Signed   By: Carey Bullocks M.D.   On: 09/22/2023 14:13    Assessment & Plan:  .Encounter for screening mammogram for malignant neoplasm of breast -     3D Screening Mammogram, Left and Right; Future  GERD with stricture -     Omeprazole; Take 1 capsule (40 mg total) by mouth 2 (two) times daily. TAKE 1 CAPSULE BY MOUTH EVERY DAY  Dispense: 60 capsule; Refill: 0  Fatigue, unspecified type -     CBC with Differential/Platelet -     TSH  Prediabetes -     Comprehensive metabolic panel -     Hemoglobin A1c  Hyperlipidemia, unspecified hyperlipidemia type -     Lipid panel -     LDL cholesterol, direct  History of colonic polyps -     Ambulatory referral to Gastroenterology  Vitamin D deficiency -     VITAMIN D 25 Hydroxy (Vit-D Deficiency, Fractures)  Gastroesophageal reflux disease with esophagitis without hemorrhage Assessment & Plan: Occurring nocturnally . Advised to increase omeprazole to bid for one month,  changing sleeping position with use of a wedge pillow to elevate torso   Chronic insomnia Assessment & Plan: She has weaned herself down to 7.5 mg mirtazapine    Travel advice encounter Assessment & Plan: Patient is travelling internationally soon and requires empiric antibotics ,  steroids and anti emetics to prevent any infections from becoming life threatening given her Addison's disease    Other orders -     Estradiol; Take 1 tablet (1 mg total) by mouth daily.  Dispense: 90 tablet; Refill: 3 -     Rosuvastatin Calcium; Take 1 tablet (5 mg total) by mouth daily.  Dispense: 90 tablet; Refill: 3 -     Triamcinolone Acetonide; Apply 1 Application topically 2 (two) times daily.  Dispense: 453.6 g; Refill: 0 -     Promethazine HCl; Take 1 tablet (12.5 mg total) by mouth every 8 (eight) hours as needed for nausea or vomiting.  Dispense: 20 tablet; Refill: 0 -     levoFLOXacin; Take 1 tablet  (500 mg  total) by mouth daily for 7 days.  Dispense: 7 tablet; Refill: 0     I provided 37 minutes of face-to-face time during this encounter reviewing patient's last visit with me, patient's  most recent visit with orthopedics,   previous  labs and imaging studies, counseling on currently addressed issues,  and post visit ordering to diagnostics and therapeutics .   Follow-up: No follow-ups on file.   Sherlene Shams, MD

## 2024-01-19 ENCOUNTER — Ambulatory Visit: Payer: Medicare PPO | Admitting: Internal Medicine

## 2024-01-19 LAB — CBC WITH DIFFERENTIAL/PLATELET
Basophils Absolute: 0.1 10*3/uL (ref 0.0–0.1)
Basophils Relative: 1.2 % (ref 0.0–3.0)
Eosinophils Absolute: 0.1 10*3/uL (ref 0.0–0.7)
Eosinophils Relative: 1 % (ref 0.0–5.0)
HCT: 42.1 % (ref 36.0–46.0)
Hemoglobin: 14 g/dL (ref 12.0–15.0)
Lymphocytes Relative: 29.5 % (ref 12.0–46.0)
Lymphs Abs: 2.4 10*3/uL (ref 0.7–4.0)
MCHC: 33.3 g/dL (ref 30.0–36.0)
MCV: 98.7 fL (ref 78.0–100.0)
Monocytes Absolute: 0.5 10*3/uL (ref 0.1–1.0)
Monocytes Relative: 6.5 % (ref 3.0–12.0)
Neutro Abs: 5.1 10*3/uL (ref 1.4–7.7)
Neutrophils Relative %: 61.8 % (ref 43.0–77.0)
Platelets: 345 10*3/uL (ref 150.0–400.0)
RBC: 4.27 Mil/uL (ref 3.87–5.11)
RDW: 13.3 % (ref 11.5–15.5)
WBC: 8.2 10*3/uL (ref 4.0–10.5)

## 2024-01-19 LAB — COMPREHENSIVE METABOLIC PANEL
ALT: 13 U/L (ref 0–35)
AST: 17 U/L (ref 0–37)
Albumin: 4.5 g/dL (ref 3.5–5.2)
Alkaline Phosphatase: 28 U/L — ABNORMAL LOW (ref 39–117)
BUN: 20 mg/dL (ref 6–23)
CO2: 26 meq/L (ref 19–32)
Calcium: 9.3 mg/dL (ref 8.4–10.5)
Chloride: 99 meq/L (ref 96–112)
Creatinine, Ser: 0.74 mg/dL (ref 0.40–1.20)
GFR: 79.51 mL/min (ref >60.00)
Glucose, Bld: 85 mg/dL (ref 70–99)
Potassium: 4.8 meq/L (ref 3.5–5.1)
Sodium: 132 meq/L — ABNORMAL LOW (ref 135–145)
Total Bilirubin: 0.3 mg/dL (ref 0.2–1.2)
Total Protein: 6.9 g/dL (ref 6.0–8.3)

## 2024-01-19 LAB — LIPID PANEL
Cholesterol: 193 mg/dL (ref 0–200)
HDL: 94.5 mg/dL (ref >39.00)
LDL Cholesterol: 79 mg/dL (ref 0–99)
NonHDL: 98.05
Total CHOL/HDL Ratio: 2
Triglycerides: 94 mg/dL (ref 0.0–149.0)
VLDL: 18.8 mg/dL (ref 0.0–40.0)

## 2024-01-19 LAB — LDL CHOLESTEROL, DIRECT: Direct LDL: 66 mg/dL

## 2024-01-19 LAB — HEMOGLOBIN A1C: Hgb A1c MFr Bld: 6.2 % (ref 4.6–6.5)

## 2024-01-19 LAB — VITAMIN D 25 HYDROXY (VIT D DEFICIENCY, FRACTURES): VITD: 50.17 ng/mL (ref 30.00–100.00)

## 2024-01-19 LAB — TSH: TSH: 1.33 u[IU]/mL (ref 0.35–5.50)

## 2024-01-20 ENCOUNTER — Encounter: Payer: Self-pay | Admitting: Internal Medicine

## 2024-01-21 ENCOUNTER — Telehealth: Payer: Self-pay

## 2024-01-21 NOTE — Telephone Encounter (Signed)
Contacted patient to let her know her referral has been received.  However, her colonoscopy is not due until after July 23rd, of this year. Informed her that I will call her back in June to schedule in July.  Thanks,  [place], [place]

## 2024-01-27 ENCOUNTER — Ambulatory Visit: Payer: Medicare PPO | Admitting: Internal Medicine

## 2024-02-15 ENCOUNTER — Other Ambulatory Visit: Payer: Self-pay | Admitting: Internal Medicine

## 2024-02-24 ENCOUNTER — Ambulatory Visit
Admission: RE | Admit: 2024-02-24 | Discharge: 2024-02-24 | Disposition: A | Discharge status: Home / Self Care | Payer: Medicare PPO | Source: Ambulatory Visit | Attending: Internal Medicine | Admitting: Internal Medicine

## 2024-02-24 DIAGNOSIS — Z1231 Encounter for screening mammogram for malignant neoplasm of breast: Secondary | ICD-10-CM | POA: Insufficient documentation

## 2024-03-14 DIAGNOSIS — M533 Sacrococcygeal disorders, not elsewhere classified: Secondary | ICD-10-CM | POA: Diagnosis not present

## 2024-03-21 ENCOUNTER — Other Ambulatory Visit: Payer: Self-pay | Admitting: Internal Medicine

## 2024-04-11 DIAGNOSIS — M533 Sacrococcygeal disorders, not elsewhere classified: Secondary | ICD-10-CM | POA: Diagnosis not present

## 2024-04-15 ENCOUNTER — Other Ambulatory Visit: Payer: Self-pay | Admitting: Internal Medicine

## 2024-04-15 DIAGNOSIS — K219 Gastro-esophageal reflux disease without esophagitis: Secondary | ICD-10-CM

## 2024-04-25 DIAGNOSIS — Z85828 Personal history of other malignant neoplasm of skin: Secondary | ICD-10-CM | POA: Diagnosis not present

## 2024-04-25 DIAGNOSIS — D692 Other nonthrombocytopenic purpura: Secondary | ICD-10-CM | POA: Diagnosis not present

## 2024-04-25 DIAGNOSIS — L570 Actinic keratosis: Secondary | ICD-10-CM | POA: Diagnosis not present

## 2024-04-25 DIAGNOSIS — C44519 Basal cell carcinoma of skin of other part of trunk: Secondary | ICD-10-CM | POA: Diagnosis not present

## 2024-04-25 DIAGNOSIS — L821 Other seborrheic keratosis: Secondary | ICD-10-CM | POA: Diagnosis not present

## 2024-04-25 DIAGNOSIS — L814 Other melanin hyperpigmentation: Secondary | ICD-10-CM | POA: Diagnosis not present

## 2024-04-25 DIAGNOSIS — C4441 Basal cell carcinoma of skin of scalp and neck: Secondary | ICD-10-CM | POA: Diagnosis not present

## 2024-04-25 DIAGNOSIS — D485 Neoplasm of uncertain behavior of skin: Secondary | ICD-10-CM | POA: Diagnosis not present

## 2024-05-23 ENCOUNTER — Telehealth: Payer: Self-pay

## 2024-05-23 ENCOUNTER — Other Ambulatory Visit: Payer: Self-pay

## 2024-05-23 DIAGNOSIS — Z860100 Personal history of colon polyps, unspecified: Secondary | ICD-10-CM

## 2024-05-23 DIAGNOSIS — Z800 Family history of malignant neoplasm of digestive organs: Secondary | ICD-10-CM

## 2024-05-23 MED ORDER — NA SULFATE-K SULFATE-MG SULF 17.5-3.13-1.6 GM/177ML PO SOLN: 1.0000 | Freq: Once | ORAL | 0 refills | Status: AC

## 2024-05-23 NOTE — Telephone Encounter (Signed)
 Gastroenterology Pre-Procedure Review  Request Date: 07/26/24 Requesting Physician: Dr. Ole Berkeley  PATIENT REVIEW QUESTIONS: The patient responded to the following health history questions as indicated:    1. Are you having any GI issues? History of diverticulitis patient said she just eliminates certain foods from her diet. 2. Do you have a personal history of Polyps? yes (last colonoscopy performed by Dr. Ole Berkeley 07/14/19 recommended repeat in 5 years) 3. Do you have a family history of Colon Cancer or Polyps? yes (mother had colon polyps and colon cancer) 4. Diabetes Mellitus? no 5. Joint replacements in the past 12 months?no 6. Major health problems in the past 3 months?no 7. Any artificial heart valves, MVP, or defibrillator?no    MEDICATIONS & ALLERGIES:    Patient reports the following regarding taking any anticoagulation/antiplatelet therapy:   Plavix, Coumadin, Eliquis, Xarelto, Lovenox, Pradaxa, Brilinta, or Effient? no Aspirin? yes (81 mg daily)  Patient confirms/reports the following medications:  Current Outpatient Medications  Medication Sig Dispense Refill   ALPRAZolam  (XANAX ) 1 MG tablet TAKE 1 TABLET BY MOUTH AT BEDTIME AS NEEDED FOR ANXIETY 30 tablet 5   aspirin 81 MG tablet Take 81 mg by mouth once a week.      calcium  citrate (CALCITRATE - DOSED IN MG ELEMENTAL CALCIUM ) 950 MG tablet Take 1 tablet by mouth daily.     cholecalciferol (VITAMIN D ) 25 MCG (1000 UNIT) tablet Take 3 tablets (3,000 Units total) by mouth daily. 90 tablet 1   diclofenac  Sodium (VOLTAREN ) 1 % GEL APPLY 2 GRAMS TOPICALLY FOUR TIMES DAILY 100 g 2   estradiol  (ESTRACE ) 1 MG tablet Take 1 tablet (1 mg total) by mouth daily. 90 tablet 3   fludrocortisone  (FLORINEF ) 0.1 MG tablet TAKE ONE TABLET BY MOUTH DAILY 90 tablet 3   hydrocortisone  (CORTEF ) 20 MG tablet TAKE ONE TABLET BY MOUTH EVERY MORNING AND TAKE ONE-HALF TABLET BY MOUTH EVERY NIGHT AS DIRECTED 135 tablet 2   ipratropium (ATROVENT ) 0.06 % nasal  spray USE 2 SPRAYS IN BOTH NOSTRILS FOUR TIMESDAILY AS DIRECTED. 15 mL 2   Lactulose  20 GM/30ML SOLN 30 ml every 4 hours until constipation is relieved 237 mL 1   mirtazapine  (REMERON ) 7.5 MG tablet TAKE ONE TABLET BY MOUTH AT BEDTIME 90 tablet 1   Multiple Vitamins-Minerals (MULTIVITAMIN WITH MINERALS) tablet Take 1 tablet by mouth daily.     omeprazole  (PRILOSEC) 40 MG capsule TAKE 1 CAPSULE BY MOUTH TWICE DAILY TAKETAKE 1 CAPSULE BY MOUTH ONCE DAILY 180 capsule 1   ondansetron  (ZOFRAN -ODT) 4 MG disintegrating tablet Take 1 tablet (4 mg total) by mouth every 8 (eight) hours as needed for nausea or vomiting. 20 tablet 0   polyethylene glycol powder (GLYCOLAX /MIRALAX ) powder USE AS DIRECTED 527 g 1   promethazine  (PHENERGAN ) 12.5 MG tablet Take 1 tablet (12.5 mg total) by mouth every 8 (eight) hours as needed for nausea or vomiting. 20 tablet 0   rosuvastatin  (CRESTOR ) 5 MG tablet Take 1 tablet (5 mg total) by mouth daily. 90 tablet 3   triamcinolone  cream (KENALOG ) 0.1 % Apply 1 Application topically 2 (two) times daily. 453.6 g 0   vitamin C (ASCORBIC ACID) 500 MG tablet Take 500 mg by mouth daily.     No current facility-administered medications for this visit.    Patient confirms/reports the following allergies:  Allergies  Allergen Reactions   Alendronate Sodium Nausea Only   Boniva [Ibandronate] Nausea And Vomiting   Clindamycin/Lincomycin Dermatitis   Tramadol     Red  face   Penicillins Rash    No orders of the defined types were placed in this encounter.   AUTHORIZATION INFORMATION Primary Insurance: 1D#: Group #:  Secondary Insurance: 1D#: Group #:  SCHEDULE INFORMATION: Date: 07/26/24 Time: Location: [facility]

## 2024-07-09 ENCOUNTER — Inpatient Hospital Stay
Admission: EM | Admit: 2024-07-09 | Discharge: 2024-07-12 | DRG: 871 | Disposition: A | Discharge status: Home / Self Care | Attending: Internal Medicine | Admitting: Internal Medicine

## 2024-07-09 ENCOUNTER — Other Ambulatory Visit: Payer: Self-pay

## 2024-07-09 DIAGNOSIS — Z886 Allergy status to analgesic agent status: Secondary | ICD-10-CM | POA: Diagnosis not present

## 2024-07-09 DIAGNOSIS — Z1152 Encounter for screening for COVID-19: Secondary | ICD-10-CM | POA: Diagnosis not present

## 2024-07-09 DIAGNOSIS — K449 Diaphragmatic hernia without obstruction or gangrene: Secondary | ICD-10-CM | POA: Diagnosis not present

## 2024-07-09 DIAGNOSIS — Z79899 Other long term (current) drug therapy: Secondary | ICD-10-CM | POA: Diagnosis not present

## 2024-07-09 DIAGNOSIS — E871 Hypo-osmolality and hyponatremia: Secondary | ICD-10-CM | POA: Diagnosis not present

## 2024-07-09 DIAGNOSIS — Z880 Allergy status to penicillin: Secondary | ICD-10-CM

## 2024-07-09 DIAGNOSIS — E876 Hypokalemia: Secondary | ICD-10-CM

## 2024-07-09 DIAGNOSIS — A419 Sepsis, unspecified organism: Secondary | ICD-10-CM | POA: Diagnosis present

## 2024-07-09 DIAGNOSIS — E785 Hyperlipidemia, unspecified: Secondary | ICD-10-CM | POA: Diagnosis present

## 2024-07-09 DIAGNOSIS — Z888 Allergy status to other drugs, medicaments and biological substances status: Secondary | ICD-10-CM

## 2024-07-09 DIAGNOSIS — F32A Depression, unspecified: Secondary | ICD-10-CM | POA: Diagnosis present

## 2024-07-09 DIAGNOSIS — J9601 Acute respiratory failure with hypoxia: Secondary | ICD-10-CM | POA: Diagnosis present

## 2024-07-09 DIAGNOSIS — R112 Nausea with vomiting, unspecified: Secondary | ICD-10-CM | POA: Diagnosis not present

## 2024-07-09 DIAGNOSIS — A498 Other bacterial infections of unspecified site: Secondary | ICD-10-CM

## 2024-07-09 DIAGNOSIS — R110 Nausea: Secondary | ICD-10-CM

## 2024-07-09 DIAGNOSIS — R1084 Generalized abdominal pain: Secondary | ICD-10-CM | POA: Diagnosis not present

## 2024-07-09 DIAGNOSIS — F419 Anxiety disorder, unspecified: Secondary | ICD-10-CM | POA: Diagnosis present

## 2024-07-09 DIAGNOSIS — R233 Spontaneous ecchymoses: Secondary | ICD-10-CM | POA: Diagnosis present

## 2024-07-09 DIAGNOSIS — E271 Primary adrenocortical insufficiency: Secondary | ICD-10-CM | POA: Diagnosis present

## 2024-07-09 DIAGNOSIS — K219 Gastro-esophageal reflux disease without esophagitis: Secondary | ICD-10-CM | POA: Diagnosis present

## 2024-07-09 DIAGNOSIS — A0811 Acute gastroenteropathy due to Norwalk agent: Secondary | ICD-10-CM | POA: Diagnosis present

## 2024-07-09 DIAGNOSIS — Z90710 Acquired absence of both cervix and uterus: Secondary | ICD-10-CM | POA: Diagnosis not present

## 2024-07-09 DIAGNOSIS — E878 Other disorders of electrolyte and fluid balance, not elsewhere classified: Secondary | ICD-10-CM

## 2024-07-09 DIAGNOSIS — B9623 Unspecified Shiga toxin-producing Escherichia coli [E. coli] (STEC) as the cause of diseases classified elsewhere: Secondary | ICD-10-CM | POA: Diagnosis present

## 2024-07-09 DIAGNOSIS — Z7982 Long term (current) use of aspirin: Secondary | ICD-10-CM | POA: Diagnosis not present

## 2024-07-09 DIAGNOSIS — R0602 Shortness of breath: Secondary | ICD-10-CM | POA: Diagnosis not present

## 2024-07-09 DIAGNOSIS — Z7952 Long term (current) use of systemic steroids: Secondary | ICD-10-CM

## 2024-07-09 DIAGNOSIS — R6521 Severe sepsis with septic shock: Secondary | ICD-10-CM | POA: Diagnosis present

## 2024-07-09 DIAGNOSIS — K529 Noninfective gastroenteritis and colitis, unspecified: Secondary | ICD-10-CM | POA: Diagnosis present

## 2024-07-09 DIAGNOSIS — R079 Chest pain, unspecified: Secondary | ICD-10-CM | POA: Diagnosis not present

## 2024-07-09 DIAGNOSIS — R231 Pallor: Secondary | ICD-10-CM | POA: Diagnosis not present

## 2024-07-09 NOTE — ED Triage Notes (Signed)
 BIB ems from home for CP, nausea and vomiting. Sudden onset started around 2000 tonight.  Just flew back from [place] about 24hrs pta  Was given 2 nitroglycerin , 4mg  zofran  pta

## 2024-07-10 ENCOUNTER — Emergency Department

## 2024-07-10 DIAGNOSIS — R6521 Severe sepsis with septic shock: Secondary | ICD-10-CM | POA: Diagnosis present

## 2024-07-10 DIAGNOSIS — E271 Primary adrenocortical insufficiency: Secondary | ICD-10-CM | POA: Diagnosis present

## 2024-07-10 DIAGNOSIS — B9623 Unspecified Shiga toxin-producing Escherichia coli [E. coli] (STEC) as the cause of diseases classified elsewhere: Secondary | ICD-10-CM | POA: Diagnosis present

## 2024-07-10 DIAGNOSIS — K449 Diaphragmatic hernia without obstruction or gangrene: Secondary | ICD-10-CM | POA: Diagnosis not present

## 2024-07-10 DIAGNOSIS — Z1152 Encounter for screening for COVID-19: Secondary | ICD-10-CM | POA: Diagnosis not present

## 2024-07-10 DIAGNOSIS — R112 Nausea with vomiting, unspecified: Secondary | ICD-10-CM | POA: Diagnosis not present

## 2024-07-10 DIAGNOSIS — R079 Chest pain, unspecified: Secondary | ICD-10-CM | POA: Diagnosis not present

## 2024-07-10 DIAGNOSIS — E876 Hypokalemia: Secondary | ICD-10-CM | POA: Diagnosis present

## 2024-07-10 DIAGNOSIS — F32A Depression, unspecified: Secondary | ICD-10-CM | POA: Diagnosis present

## 2024-07-10 DIAGNOSIS — Z90710 Acquired absence of both cervix and uterus: Secondary | ICD-10-CM | POA: Diagnosis not present

## 2024-07-10 DIAGNOSIS — K529 Noninfective gastroenteritis and colitis, unspecified: Secondary | ICD-10-CM | POA: Diagnosis present

## 2024-07-10 DIAGNOSIS — R233 Spontaneous ecchymoses: Secondary | ICD-10-CM | POA: Diagnosis present

## 2024-07-10 DIAGNOSIS — Z888 Allergy status to other drugs, medicaments and biological substances status: Secondary | ICD-10-CM | POA: Diagnosis not present

## 2024-07-10 DIAGNOSIS — E785 Hyperlipidemia, unspecified: Secondary | ICD-10-CM | POA: Diagnosis present

## 2024-07-10 DIAGNOSIS — A0811 Acute gastroenteropathy due to Norwalk agent: Secondary | ICD-10-CM | POA: Diagnosis present

## 2024-07-10 DIAGNOSIS — F419 Anxiety disorder, unspecified: Secondary | ICD-10-CM | POA: Diagnosis present

## 2024-07-10 DIAGNOSIS — E878 Other disorders of electrolyte and fluid balance, not elsewhere classified: Secondary | ICD-10-CM | POA: Diagnosis not present

## 2024-07-10 DIAGNOSIS — Z880 Allergy status to penicillin: Secondary | ICD-10-CM | POA: Diagnosis not present

## 2024-07-10 DIAGNOSIS — Z886 Allergy status to analgesic agent status: Secondary | ICD-10-CM | POA: Diagnosis not present

## 2024-07-10 DIAGNOSIS — Z79899 Other long term (current) drug therapy: Secondary | ICD-10-CM | POA: Diagnosis not present

## 2024-07-10 DIAGNOSIS — Z7952 Long term (current) use of systemic steroids: Secondary | ICD-10-CM | POA: Diagnosis not present

## 2024-07-10 DIAGNOSIS — Z7982 Long term (current) use of aspirin: Secondary | ICD-10-CM | POA: Diagnosis not present

## 2024-07-10 DIAGNOSIS — J9601 Acute respiratory failure with hypoxia: Secondary | ICD-10-CM | POA: Diagnosis present

## 2024-07-10 DIAGNOSIS — K219 Gastro-esophageal reflux disease without esophagitis: Secondary | ICD-10-CM | POA: Diagnosis present

## 2024-07-10 DIAGNOSIS — E871 Hypo-osmolality and hyponatremia: Secondary | ICD-10-CM | POA: Diagnosis not present

## 2024-07-10 DIAGNOSIS — A498 Other bacterial infections of unspecified site: Secondary | ICD-10-CM | POA: Diagnosis not present

## 2024-07-10 DIAGNOSIS — A419 Sepsis, unspecified organism: Secondary | ICD-10-CM | POA: Diagnosis present

## 2024-07-10 LAB — RESPIRATORY PANEL BY PCR

## 2024-07-10 LAB — GASTROINTESTINAL PANEL BY PCR, STOOL (REPLACES STOOL CULTURE)
Adenovirus F40/41: NOT DETECTED
Astrovirus: NOT DETECTED
Campylobacter species: NOT DETECTED
Cryptosporidium: NOT DETECTED
Cyclospora cayetanensis: NOT DETECTED
E. coli O157: NOT DETECTED
Entamoeba histolytica: NOT DETECTED
Enteroaggregative E coli (EAEC): NOT DETECTED
Enterotoxigenic E coli (ETEC): NOT DETECTED
Giardia lamblia: NOT DETECTED
Norovirus GI/GII: DETECTED — AB
Plesimonas shigelloides: NOT DETECTED
Rotavirus A: NOT DETECTED
Salmonella species: NOT DETECTED
Sapovirus (I, II, IV, and V): NOT DETECTED
Shiga like toxin producing E coli (STEC): DETECTED — AB
Shigella/Enteroinvasive E coli (EIEC): NOT DETECTED
Vibrio cholerae: NOT DETECTED
Vibrio species: NOT DETECTED
Yersinia enterocolitica: NOT DETECTED

## 2024-07-10 LAB — CBC WITH DIFFERENTIAL/PLATELET
Abs Immature Granulocytes: 0.04 K/uL (ref 0.00–0.07)
Basophils Absolute: 0 K/uL (ref 0.0–0.1)
Basophils Relative: 0 %
Eosinophils Absolute: 0 K/uL (ref 0.0–0.5)
Eosinophils Relative: 0 %
HCT: 46.1 % — ABNORMAL HIGH (ref 36.0–46.0)
Hemoglobin: 15.3 g/dL — ABNORMAL HIGH (ref 12.0–15.0)
Immature Granulocytes: 1 %
Lymphocytes Relative: 13 %
Lymphs Abs: 1.1 K/uL (ref 0.7–4.0)
MCH: 32.8 pg (ref 26.0–34.0)
MCHC: 33.2 g/dL (ref 30.0–36.0)
MCV: 98.7 fL (ref 80.0–100.0)
Monocytes Absolute: 0.2 K/uL (ref 0.1–1.0)
Monocytes Relative: 2 %
Neutro Abs: 7.1 K/uL (ref 1.7–7.7)
Neutrophils Relative %: 84 %
Platelets: 256 K/uL (ref 150–400)
RBC: 4.67 MIL/uL (ref 3.87–5.11)
RDW: 12.6 % (ref 11.5–15.5)
WBC: 8.5 K/uL (ref 4.0–10.5)
nRBC: 0 % (ref 0.0–0.2)

## 2024-07-10 LAB — COMPREHENSIVE METABOLIC PANEL WITH GFR
ALT: 15 U/L (ref 0–44)
AST: 25 U/L (ref 15–41)
Albumin: 3.1 g/dL — ABNORMAL LOW (ref 3.5–5.0)
Alkaline Phosphatase: 21 U/L — ABNORMAL LOW (ref 38–126)
Anion gap: 9 (ref 5–15)
BUN: 22 mg/dL (ref 8–23)
CO2: 20 mmol/L — ABNORMAL LOW (ref 22–32)
Calcium: 8.4 mg/dL — ABNORMAL LOW (ref 8.9–10.3)
Chloride: 106 mmol/L (ref 98–111)
Creatinine, Ser: 0.83 mg/dL (ref 0.44–1.00)
GFR, Estimated: >60 mL/min (ref >60)
Glucose, Bld: 94 mg/dL (ref 70–99)
Potassium: 3.7 mmol/L (ref 3.5–5.1)
Sodium: 135 mmol/L (ref 135–145)
Total Bilirubin: 0.8 mg/dL (ref 0.0–1.2)
Total Protein: 5.3 g/dL — ABNORMAL LOW (ref 6.5–8.1)

## 2024-07-10 LAB — SARS CORONAVIRUS 2 BY RT PCR: SARS Coronavirus 2 by RT PCR: NEGATIVE

## 2024-07-10 LAB — URINALYSIS, ROUTINE W REFLEX MICROSCOPIC
Bilirubin Urine: NEGATIVE
Glucose, UA: NEGATIVE mg/dL
Hgb urine dipstick: NEGATIVE
Ketones, ur: NEGATIVE mg/dL
Leukocytes,Ua: NEGATIVE
Nitrite: NEGATIVE
Protein, ur: NEGATIVE mg/dL
Specific Gravity, Urine: 1.033 — ABNORMAL HIGH (ref 1.005–1.030)
pH: 5 (ref 5.0–8.0)

## 2024-07-10 LAB — LACTIC ACID, PLASMA
Lactic Acid, Venous: 1.7 mmol/L (ref 0.5–1.9)
Lactic Acid, Venous: 2.8 mmol/L (ref 0.5–1.9)
Lactic Acid, Venous: 3.3 mmol/L (ref 0.5–1.9)

## 2024-07-10 LAB — PROTIME-INR
INR: 0.9 (ref 0.8–1.2)
Prothrombin Time: 13.1 s (ref 11.4–15.2)

## 2024-07-10 LAB — TROPONIN I (HIGH SENSITIVITY)
Troponin I (High Sensitivity): 3 ng/L (ref <18)
Troponin I (High Sensitivity): 3 ng/L (ref <18)

## 2024-07-10 LAB — GLUCOSE, CAPILLARY: Glucose-Capillary: 100 mg/dL — ABNORMAL HIGH (ref 70–99)

## 2024-07-10 LAB — T4, FREE: Free T4: 1.01 ng/dL (ref 0.61–1.12)

## 2024-07-10 LAB — TSH: TSH: 4.578 u[IU]/mL — ABNORMAL HIGH (ref 0.350–4.500)

## 2024-07-10 LAB — C DIFFICILE QUICK SCREEN W PCR REFLEX
C Diff antigen: NEGATIVE
C Diff interpretation: NOT DETECTED
C Diff toxin: NEGATIVE

## 2024-07-10 LAB — MRSA NEXT GEN BY PCR, NASAL: MRSA by PCR Next Gen: NOT DETECTED

## 2024-07-10 MED ORDER — DROPERIDOL 2.5 MG/ML IJ SOLN: 1.2500 mg | Freq: Once | INTRAMUSCULAR | Status: DC

## 2024-07-10 MED ORDER — ONDANSETRON HCL 4 MG/2ML IJ SOLN: 4.0000 mg | INTRAMUSCULAR | Status: DC

## 2024-07-10 MED ORDER — PANTOPRAZOLE SODIUM 40 MG PO TBEC
40.0000 mg | DELAYED_RELEASE_TABLET | Freq: Every day | ORAL | Status: DC
  Administered 2024-07-10 – 2024-07-12 (×3): 40 mg via ORAL
  Filled 2024-07-10 (×3): qty 1

## 2024-07-10 MED ORDER — MIRTAZAPINE 15 MG PO TABS
7.5000 mg | ORAL_TABLET | Freq: Every day | ORAL | Status: DC
  Administered 2024-07-10 – 2024-07-11 (×2): 7.5 mg via ORAL
  Filled 2024-07-10 (×2): qty 1

## 2024-07-10 MED ORDER — SODIUM CHLORIDE 0.9 % IV SOLN
2.0000 g | Freq: Two times a day (BID) | INTRAVENOUS | Status: DC
  Filled 2024-07-10: qty 12.5

## 2024-07-10 MED ORDER — METRONIDAZOLE 500 MG/100ML IV SOLN
500.0000 mg | Freq: Two times a day (BID) | INTRAVENOUS | Status: DC
  Filled 2024-07-10: qty 100

## 2024-07-10 MED ORDER — MORPHINE SULFATE (PF) 4 MG/ML IV SOLN
4.0000 mg | Freq: Once | INTRAVENOUS | Status: AC
  Administered 2024-07-10: 4 mg via INTRAVENOUS
  Filled 2024-07-10: qty 1

## 2024-07-10 MED ORDER — ASPIRIN 81 MG PO TBEC
81.0000 mg | DELAYED_RELEASE_TABLET | ORAL | Status: DC
  Administered 2024-07-10: 81 mg via ORAL
  Filled 2024-07-10: qty 1

## 2024-07-10 MED ORDER — PROMETHAZINE HCL 25 MG PO TABS
12.5000 mg | ORAL_TABLET | Freq: Three times a day (TID) | ORAL | Status: DC | PRN
  Administered 2024-07-10 (×2): 12.5 mg via ORAL
  Filled 2024-07-10 (×3): qty 1

## 2024-07-10 MED ORDER — CHLORHEXIDINE GLUCONATE CLOTH 2 % EX PADS
6.0000 | MEDICATED_PAD | Freq: Every day | CUTANEOUS | Status: DC
  Administered 2024-07-10 – 2024-07-12 (×3): 6 via TOPICAL

## 2024-07-10 MED ORDER — LACTATED RINGERS IV BOLUS
1000.0000 mL | Freq: Once | INTRAVENOUS | Status: AC
  Administered 2024-07-10: 1000 mL via INTRAVENOUS

## 2024-07-10 MED ORDER — ALPRAZOLAM 1 MG PO TABS
1.0000 mg | ORAL_TABLET | Freq: Every evening | ORAL | Status: DC | PRN
  Administered 2024-07-11: 1 mg via ORAL
  Filled 2024-07-10: qty 1

## 2024-07-10 MED ORDER — IOHEXOL 350 MG/ML SOLN
100.0000 mL | Freq: Once | INTRAVENOUS | Status: AC | PRN
  Administered 2024-07-10: 100 mL via INTRAVENOUS

## 2024-07-10 MED ORDER — ACETAMINOPHEN 500 MG PO TABS
1000.0000 mg | ORAL_TABLET | Freq: Once | ORAL | Status: AC
  Administered 2024-07-10: 500 mg via ORAL
  Filled 2024-07-10: qty 2

## 2024-07-10 MED ORDER — SODIUM CHLORIDE 0.9 % IV SOLN
2.0000 g | Freq: Once | INTRAVENOUS | Status: AC
  Administered 2024-07-10: 2 g via INTRAVENOUS
  Filled 2024-07-10: qty 12.5

## 2024-07-10 MED ORDER — IPRATROPIUM BROMIDE 0.06 % NA SOLN: 1.0000 | Freq: Three times a day (TID) | NASAL | Status: DC | PRN

## 2024-07-10 MED ORDER — ENOXAPARIN SODIUM 40 MG/0.4ML IJ SOSY
40.0000 mg | PREFILLED_SYRINGE | INTRAMUSCULAR | Status: DC
  Administered 2024-07-10 – 2024-07-11 (×2): 40 mg via SUBCUTANEOUS
  Filled 2024-07-10 (×2): qty 0.4

## 2024-07-10 MED ORDER — NOREPINEPHRINE 4 MG/250ML-% IV SOLN
0.0000 ug/min | INTRAVENOUS | Status: DC
  Administered 2024-07-10: 5 ug/min via INTRAVENOUS
  Filled 2024-07-10: qty 250

## 2024-07-10 MED ORDER — LACTATED RINGERS IV BOLUS (SEPSIS)
1000.0000 mL | Freq: Once | INTRAVENOUS | Status: AC
  Administered 2024-07-10: 1000 mL via INTRAVENOUS

## 2024-07-10 MED ORDER — ROSUVASTATIN CALCIUM 5 MG PO TABS
5.0000 mg | ORAL_TABLET | Freq: Every day | ORAL | Status: DC
  Administered 2024-07-10 – 2024-07-12 (×3): 5 mg via ORAL
  Filled 2024-07-10 (×4): qty 1

## 2024-07-10 MED ORDER — DROPERIDOL 2.5 MG/ML IJ SOLN
1.2500 mg | Freq: Once | INTRAMUSCULAR | Status: AC
  Administered 2024-07-10: 1.25 mg via INTRAVENOUS
  Filled 2024-07-10: qty 2

## 2024-07-10 MED ORDER — SODIUM CHLORIDE 0.9 % IV SOLN
250.0000 mL | INTRAVENOUS | Status: DC
  Administered 2024-07-10: 250 mL via INTRAVENOUS

## 2024-07-10 MED ORDER — HYDROCORTISONE SOD SUC (PF) 100 MG IJ SOLR
50.0000 mg | Freq: Three times a day (TID) | INTRAMUSCULAR | Status: DC
  Administered 2024-07-10 – 2024-07-11 (×4): 50 mg via INTRAVENOUS
  Filled 2024-07-10 (×3): qty 1
  Filled 2024-07-10: qty 2
  Filled 2024-07-10: qty 1
  Filled 2024-07-10: qty 2

## 2024-07-10 MED ORDER — ORAL CARE MOUTH RINSE
15.0000 mL | OROMUCOSAL | Status: DC | PRN
Start: 2024-07-10 — End: 2024-07-12

## 2024-07-10 MED ORDER — ACETAMINOPHEN 325 MG PO TABS
650.0000 mg | ORAL_TABLET | Freq: Four times a day (QID) | ORAL | Status: DC | PRN
  Administered 2024-07-10 – 2024-07-11 (×2): 650 mg via ORAL
  Filled 2024-07-10 (×2): qty 2

## 2024-07-10 MED ORDER — DIPHENHYDRAMINE HCL 50 MG/ML IJ SOLN
25.0000 mg | INTRAMUSCULAR | Status: AC
  Administered 2024-07-10: 25 mg via INTRAVENOUS
  Filled 2024-07-10: qty 1

## 2024-07-10 MED ORDER — POLYETHYLENE GLYCOL 3350 17 G PO PACK: 17.0000 g | PACK | Freq: Every day | ORAL | Status: DC | PRN

## 2024-07-10 MED ORDER — METOCLOPRAMIDE HCL 5 MG/ML IJ SOLN
10.0000 mg | INTRAMUSCULAR | Status: AC
  Administered 2024-07-10: 10 mg via INTRAVENOUS
  Filled 2024-07-10: qty 2

## 2024-07-10 MED ORDER — METRONIDAZOLE 500 MG/100ML IV SOLN
500.0000 mg | Freq: Once | INTRAVENOUS | Status: AC
  Administered 2024-07-10: 500 mg via INTRAVENOUS
  Filled 2024-07-10: qty 100

## 2024-07-10 MED ORDER — SODIUM CHLORIDE 0.9 % IV SOLN: 2.0000 g | Freq: Two times a day (BID) | INTRAVENOUS | Status: DC

## 2024-07-10 MED ORDER — DOCUSATE SODIUM 100 MG PO CAPS
100.0000 mg | ORAL_CAPSULE | Freq: Two times a day (BID) | ORAL | Status: DC | PRN
Start: 2024-07-10 — End: 2024-07-12

## 2024-07-10 NOTE — ED Notes (Signed)
 Gordan MD aware BP 91/52 (64) after 3rd LR 1L bolus. Per MD, place pt in Trendelenburg and monitor.

## 2024-07-10 NOTE — ED Notes (Signed)
 Advised nurse that patient has ready bed

## 2024-07-10 NOTE — ED Notes (Signed)
 Fuad MD made aware pt temp 100.61F

## 2024-07-10 NOTE — ED Notes (Signed)
 Patient transported to CT

## 2024-07-10 NOTE — Progress Notes (Signed)
 CODE SEPSIS - PHARMACY COMMUNICATION  **Broad Spectrum Antibiotics should be administered within 1 hour of Sepsis diagnosis**  Time Code Sepsis Called/Page Received:  7/20 @ 0349  Antibiotics Ordered: cefepime , metronidazole    Time of 1st antibiotic administration: cefepime  2 gm IV X 1 on 7/20 @ 0409  Additional action taken by pharmacy:   If necessary, Name of Provider/Nurse Contacted:     Saryn Cherry D ,PharmD Clinical Pharmacist  07/10/2024  7:05 AM

## 2024-07-10 NOTE — ED Notes (Signed)
 Gordan MD aware pt temp 100.42F. Blankets removed.

## 2024-07-10 NOTE — ED Provider Notes (Signed)
 [facility] Provider Note    Event Date/Time   First MD Initiated Contact with Patient 07/09/24 2356     (approximate)   History   Chest Pain   HPI Veronica Ferrell is a 73 y.o. female who reports no prior cardiac or pulmonary history, with the main issue of her history of being Addison's disease.  She presents tonight by EMS for acute onset of abdominal pain, nausea, vomiting, and diarrhea.  The onset was at around 8 PM and occurred very suddenly and she said that she was having diarrhea and vomiting at the same time.  After 12 or 13 episodes of vomiting she started to have chest pain as well which she describes as a tightness.  EMS gave 2 nitroglycerin  and 4 mg of Zofran  and she said it helped the symptoms a little bit but not much.  She is still in substantial distress upon arrival.  Of note, she and her husband just returned from [place] about 24 hours ago.  She felt well and not at all abnormal until 8 PM when the symptoms started.  Her husband is not having any symptoms.  She has no history of blood clots in her legs and her lungs although she says she has a history of bad varicose veins and has had vein stripping several years ago.  She has not had any unilateral leg pain or swelling.  She has not had any shortness of breath.     Physical Exam   Triage Vital Signs: ED Triage Vitals  Encounter Vitals Group     BP 07/10/24 0002 95/74     Girls Systolic BP Percentile --      Girls Diastolic BP Percentile --      Boys Systolic BP Percentile --      Boys Diastolic BP Percentile --      Pulse Rate 07/09/24 2357 (!) 103     Resp 07/09/24 2357 20     Temp --      Temp Source 07/09/24 2357 Oral     SpO2 07/09/24 2357 95 %     Weight 07/10/24 0001 55.8 kg (123 lb)     Height 07/09/24 2359 1.626 m (5' 4)     Head Circumference --      Peak Flow --      Pain Score --      Pain Loc --      Pain Education --      Exclude from Growth Chart --     Most  recent vital signs: Vitals:   07/10/24 0404 07/10/24 0415  BP:  (!) 101/58  Pulse:  (!) 101  Resp:  (!) 22  Temp: (!) 100.4 F (38 C)   SpO2:  97%    General: Awake, substantial distress and anxiety associated with the pain and discomfort she is experiencing.  She reports that the persistent nausea is the most uncomfortable issue. CV:  Good peripheral perfusion.  Normal heart sounds, mild tachycardia, regular rhythm. Resp:  Normal effort. Speaking easily and comfortably, no accessory muscle usage nor intercostal retractions.  Lungs are clear to auscultation bilaterally. Abd:  No distention.  Soft, mild generalized tenderness to palpation. Other:  No peripheral edema.   ED Results / Procedures / Treatments   Labs (all labs ordered are listed, but only abnormal results are displayed) Labs Reviewed  LACTIC ACID, PLASMA - Abnormal; Notable for the following components:      Result Value  Lactic Acid, Venous 3.3 (*)    All other components within normal limits  LACTIC ACID, PLASMA - Abnormal; Notable for the following components:   Lactic Acid, Venous 2.8 (*)    All other components within normal limits  CBC WITH DIFFERENTIAL/PLATELET - Abnormal; Notable for the following components:   Hemoglobin 15.3 (*)    HCT 46.1 (*)    All other components within normal limits  COMPREHENSIVE METABOLIC PANEL WITH GFR - Abnormal; Notable for the following components:   CO2 20 (*)    Calcium  8.4 (*)    Total Protein 5.3 (*)    Albumin 3.1 (*)    Alkaline Phosphatase 21 (*)    All other components within normal limits  URINALYSIS, ROUTINE W REFLEX MICROSCOPIC - Abnormal; Notable for the following components:   Color, Urine YELLOW (*)    APPearance CLEAR (*)    Specific Gravity, Urine 1.033 (*)    All other components within normal limits  GASTROINTESTINAL PANEL BY PCR, STOOL (REPLACES STOOL CULTURE)  C DIFFICILE QUICK SCREEN W PCR REFLEX    CULTURE, BLOOD (ROUTINE X 2)  CULTURE, BLOOD  (ROUTINE X 2)  PROTIME-INR  LACTIC ACID, PLASMA  TROPONIN I (HIGH SENSITIVITY)  TROPONIN I (HIGH SENSITIVITY)     EKG  ED ECG REPORT I, Darleene Dome, the attending physician, personally viewed and interpreted this ECG.  Date: 07/10/2024 EKG Time: 00: 00 Rate: 103 Rhythm: Sinus tachycardia QRS Axis: normal Intervals: normal ST/T Wave abnormalities: normal Narrative Interpretation: no evidence of acute ischemia    RADIOLOGY See ED course for details   PROCEDURES:  Critical Care performed: Yes, see critical care procedure note(s)  .1-3 Lead EKG Interpretation  Performed by: Dome Darleene, MD Authorized by: Dome Darleene, MD     Interpretation: abnormal     ECG rate:  102   ECG rate assessment: tachycardic     Rhythm: sinus tachycardia     Ectopy: none     Conduction: normal   .Critical Care  Performed by: Dome Darleene, MD Authorized by: Dome Darleene, MD   Critical care provider statement:    Critical care time (minutes):  60   Critical care time was exclusive of:  Separately billable procedures and treating other patients   Critical care was necessary to treat or prevent imminent or life-threatening deterioration of the following conditions:  Shock and sepsis   Critical care was time spent personally by me on the following activities:  Development of treatment plan with patient or surrogate, evaluation of patient's response to treatment, examination of patient, obtaining history from patient or surrogate, ordering and performing treatments and interventions, ordering and review of laboratory studies, ordering and review of radiographic studies, pulse oximetry, re-evaluation of patient's condition and review of old charts     IMPRESSION / MDM / ASSESSMENT AND PLAN / ED COURSE  I reviewed the triage vital signs and the nursing notes.                              Differential diagnosis includes, but is not limited to, gastroenteritis (foodborne pathogen or  viral), PE, ACS, SBO or ileus.  Patient's presentation is most consistent with acute presentation with potential threat to life or bodily function.  Labs/studies ordered: High-sensitivity troponin, pro time-INR, CBC with differential, lactic acid, CMP, GI pathogen panel, C. difficile quick screen with PCR, chest x-ray, EKG  Interventions/Medications given:  Medications  ceFEPIme  (MAXIPIME ) 2  g in sodium chloride  0.9 % 100 mL IVPB (2 g Intravenous New Bag/Given 07/10/24 0409)  metroNIDAZOLE  (FLAGYL ) IVPB 500 mg (500 mg Intravenous New Bag/Given 07/10/24 0426)  lactated ringers  bolus 1,000 mL (0 mLs Intravenous Stopped 07/10/24 0253)  morphine  (PF) 4 MG/ML injection 4 mg (4 mg Intravenous Given 07/10/24 0008)  droperidol  (INAPSINE ) 2.5 MG/ML injection 1.25 mg (1.25 mg Intravenous Given 07/10/24 0007)  diphenhydrAMINE  (BENADRYL ) injection 25 mg (25 mg Intravenous Given 07/10/24 0023)  lactated ringers  bolus 1,000 mL (0 mLs Intravenous Stopped 07/10/24 0253)  metoCLOPramide  (REGLAN ) injection 10 mg (10 mg Intravenous Given 07/10/24 0147)  iohexol  (OMNIPAQUE ) 350 MG/ML injection 100 mL (100 mLs Intravenous Contrast Given 07/10/24 0224)  lactated ringers  bolus 1,000 mL (1,000 mLs Intravenous New Bag/Given 07/10/24 0353)  acetaminophen  (TYLENOL ) tablet 1,000 mg (500 mg Oral Given 07/10/24 0416)    (Note:  hospital course my include additional interventions and/or labs/studies not listed above.)   The acute onset of GI symptoms including simultaneous vomiting and diarrhea would suggest a foodborne pathogen.  However given her recent travel history, PE and even ACS cannot be excluded.  Given the copious diarrhea I will check bio fire pathogen panel should she produce a sample while here.  She is a little bit hypertensive so ordered 1 L LR IV bolus as well as morphine  4 mg IV and droperidol  1.25 mg IV to help with the pain and vomiting given that she already has received Zofran .  The patient is on the cardiac  monitor to evaluate for evidence of arrhythmia and/or significant heart rate changes.   Clinical Course as of 07/10/24 0533  [place] Jul 10, 2024  0021 Patient has some redness at the IV site after receiving the droperidol  and morphine .  She has had morphine  before without reaction, so it is possible she may be having a mild allergic reaction to the droperidol .  I ordered diphenhydramine  25 mg IV which should help with the probable allergic reaction as well as help as an additional antiemetic. [CF]  0042 Lactic Acid, Venous(!!): 3.3 Patient is lactic is 3.3, but she has no leukocytosis and no fever.  I think this might be falsely elevated due to the vomiting and the tachypnea associated with her respiratory distress.  She is borderline hypotensive but that may also be volume depletion.  I ordered a second liter of fluids for a total of more than 30 mL/kg based on total body weight, thus meeting sepsis criteria although she technically does not meet SIRS criteria at this time based on her triage vital signs. [CF]  0059 DG Chest Port 1 View I independently viewed and interpreted the patient's chest x-ray(s), and I also reviewed the radiologist's report.  No evidence of pneumonia, pneumothorax, nor wide mediastinum.  Confirmed by radiologist. [CF]  9859 Patient still reporting nausea.  Trying metoclopramide  10 mg IV [CF]  0203 Comprehensive metabolic panel with GFR(!) Generally reassuring CMP.  I will proceed with imaging at this time.  Given the concern of recent travel, chest pain, etc., I will proceed with CTA chest with CT abdomen pelvis as well as [CF]  0210 Lactic Acid, Venous(!!): 2.8 Slight improvement of lactic acid, down from 3.3-2.8, still elevated but fluids are not yet completed. [CF]  [id number] I independently viewed and interpreted the patient's CTA chest as well as her CT abdomen and pelvis.  I see no evidence of pneumonia nor pulmonary embolism and the radiologist report confirms this.   Additionally, I see no evidence  of SBO or ileus or other acute infectious or inflammatory process in the abdomen, and again the radiologist confirmed no acute findings. [CF]  0330 I reassessed the patient and she is still having severe nausea and is unable to take any oral intake.  She has not had any additional diarrhea.  I strongly suspect a viral gastroenteritis but the symptoms are severe enough she is unable to tolerate oral intake and multiple rounds of medication have not alleviated her discomfort.  Additionally, she is remaining hypotensive, currently with a MAP of 63.  I have ordered a 3rd L of fluid and I will monitor carefully but she may require a small amount of peripheral vasopressors and admission to the ICU if her blood pressure does not improve. [CF]  0345 The patient and is now satting in the 80s, at 88% at rest with a good waveform.  Despite having a generally reassuring workup, she now has enough vital sign abnormalities that I am going to call her sepsis and treat her empirically with antibiotics designated for probable intra-abdominal infection.  Her lungs were clear on CT scan and I think that pneumonia is very unlikely.  I am ordering cefepime  2 g IV and metronidazole  500 mg IV as well as starting the patient on 2 L of oxygen. [CF]  0349 Urinalysis, Routine w reflex microscopic -Urine, Clean Catch(!) Clear urinalysis [CF]  0350 Troponin I (High Sensitivity) 2 negative troponin [CF]  0405 Temp(!): 100.4 F (38 C) Upon reassessment the patient now has a fever of 100.4, justifying invalidating my plan to have made her sepsis protocol [CF]  0415 Repeat blood pressure is now 100 systolic, MAP of 73.  I am consulting the hospitalist for admission [CF]  [phone] Legionella is another possibility for the patient's consolation of symptoms but I will defer the testing to the hospitalist team. [CF]  [phone] I consulted by phone with the admitting hospitalist, and they will admit the patient - Dr.  Lawence [CF]  [phone] Patient is now 77/54 (MAP 60).  I ordered peripheral Levophed  and messaged both the PCCM team Meg Nose) and Dr. Lawence to determine if the patient needs to be on the PCCM service rather than the hospitalist. [CF]  [phone] Consulting in person with Almarie Nose (PCCM NP) in the ED.  She will see and admit the patient to her service. [CF]    Clinical Course User Index [CF] Gordan Huxley, MD     FINAL CLINICAL IMPRESSION(S) / ED DIAGNOSES   Final diagnoses:  Septic shock (HCC)  Intractable nausea  Gastroenteritis  Chest pain, unspecified type  Generalized abdominal pain  Acute respiratory failure with hypoxia (HCC)     Rx / DC Orders   ED Discharge Orders     None        Note:  This document was prepared using Dragon voice recognition software and may include unintentional dictation errors.   Gordan Huxley, MD 07/10/24 [phone]

## 2024-07-10 NOTE — ED Notes (Signed)
 Pt ambulatory to commode with assistance

## 2024-07-10 NOTE — ED Notes (Signed)
 Pt presented to ED with c/o chest pain, nausea, vomiting, and diarrhea starting at 2000 this evening. States recent travel from [place], returned home 24 hours ago. Pt states symptoms have been constant since 2000. Pt states chest pain started after she had been vomiting. A&Ox4. Pt changed into gown. Pt attached to cardiac monitor. Family at bedside, bed in lowest position,call bell within reach.

## 2024-07-10 NOTE — Progress Notes (Signed)
 Pharmacy Antibiotic Note  Veronica Ferrell is a 73 y.o. female admitted on 07/09/2024 with intra-abdominal infection.  Pharmacy has been consulted for Cefepime  dosing.  Plan: Cefepime  2 gm IV X 1 given in ED on 7/20 @ 0409. Cefepime  2 gm IV Q12H EI ordered to start on 7/20 @ 1600.   Height: 5' 4 (162.6 cm) Weight: 55.8 kg (123 lb) IBW/kg (Calculated) : 54.7  Temp (24hrs), Avg:99.4 F (37.4 C), Min:97.6 F (36.4 C), Max:100.4 F (38 C)  Recent Labs  Lab 07/10/24 0009 07/10/24 0136 07/10/24 0355  WBC 8.5  --   --   CREATININE  --  0.83  --   LATICACIDVEN 3.3* 2.8* 1.7    Estimated Creatinine Clearance: 50.6 mL/min (by C-G formula based on SCr of 0.83 mg/dL).    Allergies  Allergen Reactions   Alendronate Sodium Nausea Only   Boniva [Ibandronate] Nausea And Vomiting   Clindamycin/Lincomycin Dermatitis   Tramadol     Red face   Penicillins Rash    Antimicrobials this admission:   >>    >>   Dose adjustments this admission:   Microbiology results:  BCx:   UCx:    Sputum:    MRSA PCR:   Thank you for allowing pharmacy to be a part of this patient's care.  Ladarian Bonczek D 07/10/2024 7:02 AM

## 2024-07-10 NOTE — ED Notes (Signed)
 Gordan MD aware lactic 3.3

## 2024-07-10 NOTE — H&P (Signed)
 NAME:  Veronica Ferrell, MRN:  [id number], DOB:  1951-02-08, LOS: 0 ADMISSION DATE:  07/09/2024, CONSULTATION DATE:  07/10/24 REFERRING MD: Gordan Huxley, CHIEF COMPLAINT: Acute onset abdominal pain, nausea, vomiting and diarrhea   HPI  73 y.o with significant PMH of Addison's disease maintained on cortisone and Florinef , arthritis, chickenpox, concussion, diverticulitis, HLD, and GERD who presented to the ED with chief complaints of acute onset abdominal pain, nausea, vomiting and diarrhea since 8 PM last night.  Patient just returned with her husband from [place] about 24 hours ago.   ED Course: Initial vital signs showed HR of 103 beats/minute, BP 95/74 mm Hg, the RR 20 breaths/minute, and the oxygen saturation 95% on RA and a temperature of 100.96F (38C) Pertinent Labs/Diagnostics Findings: Na+/ K+:135/3.7  Glucose: 94 BUN/Cr.:22/0.83 WBC:8.5 K/L without bands or neutrophil predominance   Lactic acid:3.3  CXR> CTH> CTA Chest> CT Abd/pelvis>neg Medication administered in the ZI:Ejupzwu given 30 cc/kg of fluids and started on broad-spectrum antibiotics Vanco cefepime  and Flagyl  for sepsis with septic shock. Patient remained hypotensive despite IVF boluses therefore was started on Levophed .  (Sepsis reassessment completed). PCCM consulted.  Past Medical History  Addison's disease Arthritis Chickenpox Concussion Diverticulitis HDL GERD  Significant Hospital Events   7/20: Admitted to ICU with sepsis with septic shock iso suspected gastroenteritis  Consults:  None  Procedures:  None  Interim History / Subjective:      Micro Data:  7/20: SARS-CoV-2 PCR>  7/20: Influenza PCR>  7/20: Blood culture x2> 7/20: GI Panel> 7/20: MRSA PCR>>   Antimicrobials:  Vancomycin 7/20 Cefepime  7/20 Metronidazole  7/20  OBJECTIVE  Blood pressure 104/63, pulse 97, temperature 99.2 F (37.3 C), temperature source Oral, resp. rate 17, height 5' 4 (1.626 m), weight 55.8 kg, SpO2 96%.         Intake/Output Summary (Last 24 hours) at 07/10/2024 0540 Last data filed at 07/10/2024 0539 Gross per 24 hour  Intake 3196.75 ml  Output --  Net 3196.75 ml   Filed Weights   07/10/24 0001  Weight: 55.8 kg    Physical Examination  GENERAL:73 year-old critically ill appearing patient lying in the bed  EYES: PEERLA. No scleral icterus. Extraocular muscles intact.  HEENT: Head atraumatic, normocephalic. Oropharynx and nasopharynx clear.  CARDIOVASCULAR:S1, S2 normal. No murmurs, rubs, or gallops.  Intermittently tachycardic PULMONARY: Normal lung sounds ABDOMINAL: Soft, NTND MUSCULOSKELETAL: No swelling. Normal range of motion. 5/5 strength in all extremities.   NEUROLOGICAL: General: No focal deficit present. She is alert and oriented to person, place, and time baseline.  PSYCHIATRIC:  Mood and Affect: Mood normal.  SKIN:Skin is warm and dry. No obvious rash, lesion, or ulcer. Capillary Refill:> 2sec  Labs/imaging that I havepersonally reviewed  (right click and Reselect all SmartList Selections daily)  CT Angio Chest PE W/Cm &/Or Wo Cm Result Date: 07/10/2024 EXAM: CTA CHEST PE WITHOUT AND WITH CONTRAST CT ABDOMEN AND PELVIS WITHOUT AND WITH CONTRAST 07/10/2024 02:37:15 AM TECHNIQUE: CTA of the chest was performed after the administration of intravenous contrast. Multiplanar reformatted images are provided for review. MIP images are provided for review. CT of the abdomen and pelvis was performed with the administration of intravenous contrast. Automated exposure control, iterative reconstruction, and/or weight based adjustment of the mA/kV was utilized to reduce the radiation dose to as low as reasonably achievable. COMPARISON: CT abdomen and pelvis dated 08/28/2023. Partial comparison to cardiac CT dated 10/09/2022. CLINICAL HISTORY: Pulmonary embolism (PE) suspected, high prob. Per ed notes; BIB ems  from home for CP, nausea and vomiting. Sudden onset started around 2000 tonight. Just  flew back from [place] about 24hrs pta. Was given 2 nitroglycerin , 4mg  zofran  pta. FINDINGS: CHEST: PULMONARY ARTERIES: Pulmonary arteries are adequately opacified for evaluation. No evidence of pulmonary embolism. MEDIASTINUM: No mediastinal lymphadenopathy. The heart and pericardium demonstrate no acute abnormality. Although not tailored for evaluation of the thoracic aorta, there is no evidence of thoracic aortic aneurysm or dissection. Mild thoracic aortic atherosclerosis. LUNGS AND PLEURA: 5 mm triangular subpleural nodule in the right middle lobe ( image 93 ), unchanged, benign. No follow-up is recommended per [facility] Society guidelines. Mild dependent atelectasis in the posterior right upper lobe and bilateral lower lobes. No focal consolidation or pulmonary edema. No pleural effusion or pneumothorax. SOFT TISSUES AND BONES: Stable fluid density lesion along the right lateral aspect of the T10 vertebral body (image 108), favoring a benign perineural cyst. Mild degenerative changes of the lower thoracic spine. ABDOMEN AND PELVIS: LIVER: Mild periportal edema. GALLBLADDER AND BILE DUCTS: Mild layering gallbladder sludge (image 34), without associated inflammatory changes. SPLEEN: Spleen demonstrates no acute abnormality. PANCREAS: Pancreas demonstrates no acute abnormality. ADRENAL GLANDS: Adrenal glands demonstrate no acute abnormality. KIDNEYS, URETERS AND BLADDER: Stable 3 mm nonobstructing left renal calculus ( image 29 ). No hydronephrosis. No perinephric or periureteral stranding. Urinary bladder is unremarkable. GI AND BOWEL: Small hiatal hernia. Stomach and duodenal sweep demonstrate no acute abnormality. There is no bowel obstruction. No abnormal bowel wall thickening or distension. Scattered sigmoid diverticulosis, without evidence of diverticulitis. REPRODUCTIVE: Status post hysterectomy. PERITONEUM AND RETROPERITONEUM: No ascites or free air. LYMPH NODES: No lymphadenopathy. BONES AND SOFT TISSUES:  No acute abnormality of the visualized bones. No focal soft tissue abnormality. IMPRESSION: 1. No evidence of pulmonary embolism. 2. No acute findings in the chest, abdomen, or pelvis. 3. Additional stable ancillary findings as above. Electronically signed by: Pinkie Pebbles MD 07/10/2024 02:50 AM EDT RP Workstation: [id number]   CT ABDOMEN PELVIS W CONTRAST Result Date: 07/10/2024 EXAM: CTA CHEST PE WITHOUT AND WITH CONTRAST CT ABDOMEN AND PELVIS WITHOUT AND WITH CONTRAST 07/10/2024 02:37:15 AM TECHNIQUE: CTA of the chest was performed after the administration of intravenous contrast. Multiplanar reformatted images are provided for review. MIP images are provided for review. CT of the abdomen and pelvis was performed with the administration of intravenous contrast. Automated exposure control, iterative reconstruction, and/or weight based adjustment of the mA/kV was utilized to reduce the radiation dose to as low as reasonably achievable. COMPARISON: CT abdomen and pelvis dated 08/28/2023. Partial comparison to cardiac CT dated 10/09/2022. CLINICAL HISTORY: Pulmonary embolism (PE) suspected, high prob. Per ed notes; BIB ems from home for CP, nausea and vomiting. Sudden onset started around 2000 tonight. Just flew back from [place] about 24hrs pta. Was given 2 nitroglycerin , 4mg  zofran  pta. FINDINGS: CHEST: PULMONARY ARTERIES: Pulmonary arteries are adequately opacified for evaluation. No evidence of pulmonary embolism. MEDIASTINUM: No mediastinal lymphadenopathy. The heart and pericardium demonstrate no acute abnormality. Although not tailored for evaluation of the thoracic aorta, there is no evidence of thoracic aortic aneurysm or dissection. Mild thoracic aortic atherosclerosis. LUNGS AND PLEURA: 5 mm triangular subpleural nodule in the right middle lobe ( image 93 ), unchanged, benign. No follow-up is recommended per [facility] Society guidelines. Mild dependent atelectasis in the posterior right upper lobe  and bilateral lower lobes. No focal consolidation or pulmonary edema. No pleural effusion or pneumothorax. SOFT TISSUES AND BONES: Stable fluid density lesion along the right lateral aspect of  the T10 vertebral body (image 108), favoring a benign perineural cyst. Mild degenerative changes of the lower thoracic spine. ABDOMEN AND PELVIS: LIVER: Mild periportal edema. GALLBLADDER AND BILE DUCTS: Mild layering gallbladder sludge (image 34), without associated inflammatory changes. SPLEEN: Spleen demonstrates no acute abnormality. PANCREAS: Pancreas demonstrates no acute abnormality. ADRENAL GLANDS: Adrenal glands demonstrate no acute abnormality. KIDNEYS, URETERS AND BLADDER: Stable 3 mm nonobstructing left renal calculus ( image 29 ). No hydronephrosis. No perinephric or periureteral stranding. Urinary bladder is unremarkable. GI AND BOWEL: Small hiatal hernia. Stomach and duodenal sweep demonstrate no acute abnormality. There is no bowel obstruction. No abnormal bowel wall thickening or distension. Scattered sigmoid diverticulosis, without evidence of diverticulitis. REPRODUCTIVE: Status post hysterectomy. PERITONEUM AND RETROPERITONEUM: No ascites or free air. LYMPH NODES: No lymphadenopathy. BONES AND SOFT TISSUES: No acute abnormality of the visualized bones. No focal soft tissue abnormality. IMPRESSION: 1. No evidence of pulmonary embolism. 2. No acute findings in the chest, abdomen, or pelvis. 3. Additional stable ancillary findings as above. Electronically signed by: Pinkie Pebbles MD 07/10/2024 02:50 AM EDT RP Workstation: [id number]   DG Chest Port 1 View Result Date: 07/10/2024 EXAM: 1 VIEW XRAY OF THE CHEST 07/10/2024 12:46:00 AM COMPARISON: 07/27/2023 CLINICAL HISTORY: Questionable sepsis - evaluate for abnormality. IB ems from home for CP, nausea and vomiting. Sudden onset started around 2000 tonight. Just flew back from [place] about 24hrs pta. Was given 2 nitroglycerin , 4mg  zofran  pta. FINDINGS: LUNGS  AND PLEURA: No focal pulmonary opacity. No pulmonary edema. No pleural effusion. No pneumothorax. HEART AND MEDIASTINUM: No acute abnormality of the cardiac and mediastinal silhouettes. BONES AND SOFT TISSUES: No acute osseous abnormality. IMPRESSION: 1. No acute process. Electronically signed by: Pinkie Pebbles MD 07/10/2024 12:50 AM EDT RP Workstation: [id number]     Labs   CBC: Recent Labs  Lab 07/10/24 0009  WBC 8.5  NEUTROABS 7.1  HGB 15.3*  HCT 46.1*  MCV 98.7  PLT 256    Basic Metabolic Panel: Recent Labs  Lab 07/10/24 0136  NA 135  K 3.7  CL 106  CO2 20*  GLUCOSE 94  BUN 22  CREATININE 0.83  CALCIUM  8.4*   GFR: Estimated Creatinine Clearance: 50.6 mL/min (by C-G formula based on SCr of 0.83 mg/dL). Recent Labs  Lab 07/10/24 0009 07/10/24 0136 07/10/24 0355  WBC 8.5  --   --   LATICACIDVEN 3.3* 2.8* 1.7    Liver Function Tests: Recent Labs  Lab 07/10/24 0136  AST 25  ALT 15  ALKPHOS 21*  BILITOT 0.8  PROT 5.3*  ALBUMIN 3.1*   No results for input(s): LIPASE, AMYLASE in the last 168 hours. No results for input(s): AMMONIA in the last 168 hours.  ABG No results found for: PHART, PCO2ART, PO2ART, HCO3, TCO2, ACIDBASEDEF, O2SAT   Coagulation Profile: Recent Labs  Lab 07/10/24 0009  INR 0.9    Cardiac Enzymes: No results for input(s): CKTOTAL, CKMB, CKMBINDEX, TROPONINI in the last 168 hours.  HbA1C: Hgb A1c MFr Bld  Date/Time Value Ref Range Status  01/18/2024 04:08 PM 6.2 4.6 - 6.5 % Final    Comment:    Glycemic Control Guidelines for People with Diabetes:Non Diabetic:  <6%Goal of Therapy: <7%Additional Action Suggested:  >8%   07/22/2023 07:31 AM 6.0 4.6 - 6.5 % Final    Comment:    Glycemic Control Guidelines for People with Diabetes:Non Diabetic:  <6%Goal of Therapy: <7%Additional Action Suggested:  >8%     CBG: No results for input(s): GLUCAP  in the last 168 hours.  Review of Systems:   Review  of Systems  Constitutional:  Positive for fever and malaise/fatigue.  HENT: Negative.    Eyes: Negative.   Respiratory: Negative.    Cardiovascular:  Positive for chest pain.  Gastrointestinal:  Positive for abdominal pain, diarrhea, nausea and vomiting. Negative for blood in stool and melena.  Genitourinary: Negative.   Musculoskeletal:  Positive for myalgias.  Skin: Negative.   Psychiatric/Behavioral: Negative.       Past Medical History  She,  has a past medical history of Addison's disease (HCC), Allergy, Arthritis, Cancer (HCC), Chicken pox, Colon polyps, Concussion (12/26/2020), Diverticulitis, and GERD (gastroesophageal reflux disease).   Surgical History    Past Surgical History:  Procedure Laterality Date   ABDOMINAL HYSTERECTOMY     APPENDECTOMY     BREAST EXCISIONAL BIOPSY Right    2010   BREAST SURGERY     COLONOSCOPY WITH PROPOFOL  N/A 07/14/2019   Procedure: COLONOSCOPY WITH PROPOFOL ;  Surgeon: Jinny Carmine, MD;  Location: [facility] ENDOSCOPY;  Service: Endoscopy;  Laterality: N/A;   ESOPHAGOGASTRODUODENOSCOPY (EGD) WITH PROPOFOL  N/A 04/10/2017   Procedure: ESOPHAGOGASTRODUODENOSCOPY (EGD) WITH PROPOFOL ;  Surgeon: Carmine Jinny, MD;  Location: [facility];  Service: Endoscopy;  Laterality: N/A;  requeests early as possible   FOOT SURGERY Bilateral    TUBAL LIGATION     varicose veins       Social History   reports that she has never smoked. She has never used smokeless tobacco. She reports current alcohol use of about 7.0 standard drinks of alcohol per week. She reports that she does not use drugs.   Family History   Her family history includes Breast cancer in her maternal aunt, maternal aunt, and maternal grandmother; Breast cancer (age of onset: 26) in her mother; Cancer in her father, maternal aunt, maternal aunt, and maternal grandmother; Cancer (age of onset: 73) in her mother.   Allergies Allergies  Allergen Reactions   Alendronate Sodium Nausea Only    Boniva [Ibandronate] Nausea And Vomiting   Clindamycin/Lincomycin Dermatitis   Tramadol     Red face   Penicillins Rash     Home Medications  Prior to Admission medications   Medication Sig Start Date End Date Taking? Authorizing Provider  ALPRAZolam  (XANAX ) 1 MG tablet TAKE 1 TABLET BY MOUTH AT BEDTIME AS NEEDED FOR ANXIETY 04/17/23   Marylynn Verneita CROME, MD  aspirin  81 MG tablet Take 81 mg by mouth once a week.     [provider]  calcium  citrate (CALCITRATE - DOSED IN MG ELEMENTAL CALCIUM ) 950 MG tablet Take 1 tablet by mouth daily.    [provider]  cholecalciferol (VITAMIN D ) 25 MCG (1000 UNIT) tablet Take 3 tablets (3,000 Units total) by mouth daily. 03/02/21   Tullo, Teresa L, MD  diclofenac  Sodium (VOLTAREN ) 1 % GEL APPLY 2 GRAMS TOPICALLY FOUR TIMES DAILY 08/08/21   Marylynn Verneita CROME, MD  estradiol  (ESTRACE ) 1 MG tablet Take 1 tablet (1 mg total) by mouth daily. 01/18/24   Marylynn Verneita CROME, MD  fludrocortisone  (FLORINEF ) 0.1 MG tablet TAKE ONE TABLET BY MOUTH DAILY 02/15/24   Marylynn Verneita CROME, MD  hydrocortisone  (CORTEF ) 20 MG tablet TAKE ONE TABLET BY MOUTH EVERY MORNING AND TAKE ONE-HALF TABLET BY MOUTH EVERY NIGHT AS DIRECTED 03/21/24   Tullo, Teresa L, MD  ipratropium (ATROVENT ) 0.06 % nasal spray USE 2 SPRAYS IN BOTH NOSTRILS FOUR TIMESDAILY AS DIRECTED. 02/15/24   Marylynn Verneita CROME, MD  Lactulose  20 GM/30ML SOLN 30 ml every 4 hours until constipation is relieved 06/08/23   Hope Merle, MD  mirtazapine  (REMERON ) 7.5 MG tablet TAKE ONE TABLET BY MOUTH AT BEDTIME 02/15/24   Tullo, Teresa L, MD  Multiple Vitamins-Minerals (MULTIVITAMIN WITH MINERALS) tablet Take 1 tablet by mouth daily.    [provider]  omeprazole  (PRILOSEC) 40 MG capsule TAKE 1 CAPSULE BY MOUTH TWICE DAILY TAKETAKE 1 CAPSULE BY MOUTH ONCE DAILY 04/15/24   Marylynn Verneita CROME, MD  ondansetron  (ZOFRAN -ODT) 4 MG disintegrating tablet Take 1 tablet (4 mg total) by mouth every 8 (eight) hours as needed for  nausea or vomiting. 02/02/23   Marylynn Verneita CROME, MD  polyethylene glycol powder (GLYCOLAX /MIRALAX ) powder USE AS DIRECTED 02/04/18   Marylynn Verneita CROME, MD  promethazine  (PHENERGAN ) 12.5 MG tablet Take 1 tablet (12.5 mg total) by mouth every 8 (eight) hours as needed for nausea or vomiting. 01/18/24   Marylynn Verneita CROME, MD  rosuvastatin  (CRESTOR ) 5 MG tablet Take 1 tablet (5 mg total) by mouth daily. 01/18/24   Marylynn Verneita CROME, MD  triamcinolone  cream (KENALOG ) 0.1 % Apply 1 Application topically 2 (two) times daily. 01/18/24   Marylynn Verneita CROME, MD  vitamin C (ASCORBIC ACID) 500 MG tablet Take 500 mg by mouth daily.    [provider]     Active Hospital Problem list   See systems below  Assessment & Plan:  #Sepsis with septic shock due to suspected intra-abdominal source Received 3L of NS/LR & Cefepime / Vancomycin/ Metronidazole  meets SIRS criteria: Heart Rate 102 beats/minute, Respiratory Rate 22 breaths/minute,Temperature 100.4  -Supplemental oxygen as needed, to maintain SpO2 > 90% CT negative for acute abnormality -F/u cultures, trend lactic/ PCT -Monitor WBC/ fever curve -IV antibiotics with cefepime  and Flagyl  -Check GI Panel, RVP -IVF hydration as needed -Pressors for MAP goal >65 -Strict I/O's  #Addison's disease maintained on cortisone and Florinef , -Check TSH, Free T4 -Start stress dose steroids given in septic shock  #Anxiety and Depression -Continue home alprazolam  and Remeron   #HLD - Continue home Crestor     Best practice:  Diet:  NPO Pain/Anxiety/Delirium protocol (if indicated): No VAP protocol (if indicated): Not indicated DVT prophylaxis: LMWH GI prophylaxis: PPI Glucose control:  SSI No Central venous access:  N/A Arterial line:  N/A Foley:  N/A Mobility:  bed rest  PT consulted: N/A Last date of multidisciplinary goals of care discussion []  Code Status:  full code Disposition: ICU   = Goals of Care = Code Status Order: FULL  Primary Emergency  Contact: Savoca,Flavius D, Home Phone: [phone] Wishes to pursue full aggressive treatment and intervention options, including CPR and intubation, but goals of care will be addressed on going with family if that should become necessary.   Critical care time: 45 minutes        Almarie Nose DNP, CCRN, FNP-C, AGACNP-BC Acute Care & Family Nurse Practitioner [facility] PCCM on call pager [phone]

## 2024-07-10 NOTE — Consult Note (Signed)
 PHARMACY CONSULT NOTE - FOLLOW UP  Pharmacy Consult for Electrolyte Monitoring and Replacement   Recent Labs: Potassium (mmol/L)  Date Value  07/10/2024 3.7   Calcium  (mg/dL)  Date Value  92/79/7974 8.4 (L)   Albumin (g/dL)  Date Value  92/79/7974 3.1 (L)   Sodium (mmol/L)  Date Value  07/10/2024 135     Assessment: 73 y.o with significant PMH of Addison's disease maintained on cortisone and Florinef , arthritis, chickenpox, concussion, diverticulitis, HLD, and GERD who presented to the ED with chief complaints of acute onset abdominal pain, nausea, vomiting and diarrhea.   Goal of Therapy:  WNL  Plan:  No replacement needed F/u with Am labs.   Cathaleen GORMAN Blanch ,PharmD Clinical Pharmacist 07/10/2024 7:30 AM

## 2024-07-10 NOTE — Progress Notes (Signed)
 Pt being followed by [facility] for Sepsis protocol.

## 2024-07-11 DIAGNOSIS — E785 Hyperlipidemia, unspecified: Secondary | ICD-10-CM

## 2024-07-11 DIAGNOSIS — A0811 Acute gastroenteropathy due to Norwalk agent: Secondary | ICD-10-CM

## 2024-07-11 DIAGNOSIS — R6521 Severe sepsis with septic shock: Secondary | ICD-10-CM

## 2024-07-11 DIAGNOSIS — E271 Primary adrenocortical insufficiency: Secondary | ICD-10-CM

## 2024-07-11 DIAGNOSIS — K529 Noninfective gastroenteritis and colitis, unspecified: Secondary | ICD-10-CM

## 2024-07-11 DIAGNOSIS — J9601 Acute respiratory failure with hypoxia: Secondary | ICD-10-CM | POA: Insufficient documentation

## 2024-07-11 DIAGNOSIS — A498 Other bacterial infections of unspecified site: Secondary | ICD-10-CM

## 2024-07-11 DIAGNOSIS — A419 Sepsis, unspecified organism: Secondary | ICD-10-CM | POA: Diagnosis not present

## 2024-07-11 DIAGNOSIS — E878 Other disorders of electrolyte and fluid balance, not elsewhere classified: Secondary | ICD-10-CM

## 2024-07-11 HISTORY — DX: Acute gastroenteropathy due to Norwalk agent: A08.11

## 2024-07-11 HISTORY — DX: Sepsis, unspecified organism: R65.21

## 2024-07-11 LAB — BASIC METABOLIC PANEL WITH GFR
Anion gap: 9 (ref 5–15)
BUN: 10 mg/dL (ref 8–23)
CO2: 23 mmol/L (ref 22–32)
Calcium: 8 mg/dL — ABNORMAL LOW (ref 8.9–10.3)
Chloride: 102 mmol/L (ref 98–111)
Creatinine, Ser: 0.71 mg/dL (ref 0.44–1.00)
GFR, Estimated: >60 mL/min (ref >60)
Glucose, Bld: 96 mg/dL (ref 70–99)
Potassium: 3.6 mmol/L (ref 3.5–5.1)
Sodium: 134 mmol/L — ABNORMAL LOW (ref 135–145)

## 2024-07-11 LAB — CBC
HCT: 35.3 % — ABNORMAL LOW (ref 36.0–46.0)
Hemoglobin: 11.8 g/dL — ABNORMAL LOW (ref 12.0–15.0)
MCH: 32.9 pg (ref 26.0–34.0)
MCHC: 33.4 g/dL (ref 30.0–36.0)
MCV: 98.3 fL (ref 80.0–100.0)
Platelets: 250 K/uL (ref 150–400)
RBC: 3.59 MIL/uL — ABNORMAL LOW (ref 3.87–5.11)
RDW: 12.7 % (ref 11.5–15.5)
WBC: 3.6 K/uL — ABNORMAL LOW (ref 4.0–10.5)
nRBC: 0 % (ref 0.0–0.2)

## 2024-07-11 LAB — PHOSPHORUS: Phosphorus: 3.3 mg/dL (ref 2.5–4.6)

## 2024-07-11 LAB — MAGNESIUM: Magnesium: 1.8 mg/dL (ref 1.7–2.4)

## 2024-07-11 MED ORDER — HYDROCORTISONE 10 MG PO TABS
10.0000 mg | ORAL_TABLET | Freq: Every day | ORAL | Status: DC
  Administered 2024-07-11: 10 mg via ORAL
  Filled 2024-07-11 (×2): qty 1

## 2024-07-11 MED ORDER — FLUDROCORTISONE ACETATE 0.1 MG PO TABS
100.0000 ug | ORAL_TABLET | Freq: Every day | ORAL | Status: DC
  Administered 2024-07-12: 100 ug via ORAL
  Filled 2024-07-11: qty 1

## 2024-07-11 MED ORDER — POTASSIUM CHLORIDE 10 MEQ/100ML IV SOLN
10.0000 meq | INTRAVENOUS | Status: AC
  Administered 2024-07-11 (×2): 10 meq via INTRAVENOUS
  Filled 2024-07-11 (×2): qty 100

## 2024-07-11 MED ORDER — HYDROCORTISONE 10 MG PO TABS
30.0000 mg | ORAL_TABLET | Freq: Every day | ORAL | Status: DC
  Filled 2024-07-11: qty 3

## 2024-07-11 MED ORDER — HYDROCORTISONE 10 MG PO TABS
20.0000 mg | ORAL_TABLET | Freq: Every day | ORAL | Status: DC
  Administered 2024-07-12: 20 mg via ORAL
  Filled 2024-07-11: qty 2

## 2024-07-11 MED ORDER — MAGNESIUM SULFATE 2 GM/50ML IV SOLN
2.0000 g | Freq: Once | INTRAVENOUS | Status: AC
  Administered 2024-07-11: 2 g via INTRAVENOUS
  Filled 2024-07-11: qty 50

## 2024-07-11 NOTE — Assessment & Plan Note (Signed)
 Resolved.  1 pulse ox of 89% on 2 L.  Now on room air

## 2024-07-11 NOTE — Assessment & Plan Note (Signed)
 With hypokalemia and hypomagnesemia and hyponatremia.  Replace magnesium  and potassium today.

## 2024-07-11 NOTE — Hospital Course (Signed)
 73 y.o with significant PMH of Addison's disease maintained on cortisone and Florinef , arthritis, chickenpox, concussion, diverticulitis, HLD, and GERD who presented to the ED with chief complaints of acute onset abdominal pain, nausea, vomiting and diarrhea since 8 PM last night.  Patient just returned with her husband from [place] about 24 hours ago.   ED Course: Initial vital signs showed HR of 103 beats/minute, BP 95/74 mm Hg, the RR 20 breaths/minute, and the oxygen saturation 95% on RA and a temperature of 100.61F (38C) Pertinent Labs/Diagnostics Findings: Na+/ K+:135/3.7  Glucose: 94 BUN/Cr.:22/0.83 WBC:8.5 K/L without bands or neutrophil predominance   Lactic acid:3.3  CXR> CTH> CTA Chest> CT Abd/pelvis>neg Medication administered in the ZI:Ejupzwu given 30 cc/kg of fluids and started on broad-spectrum antibiotics Vanco cefepime  and Flagyl  for sepsis with septic shock. Patient remained hypotensive despite IVF boluses therefore was started on Levophed .  (Sepsis reassessment completed). PCCM consulted.  7/21.  Still feels with upset stomach.  No further diarrhea or vomiting.  Stool culture positive for norovirus and Shiga like toxin producing E. coli. 7/22.  Patient feeling better.  Tolerating diet.  Patient noticed some bruising along her shins.

## 2024-07-11 NOTE — Assessment & Plan Note (Signed)
 Secondary to norovirus and Shiga toxin producing E. coli.  Supportive care.

## 2024-07-11 NOTE — Consult Note (Signed)
 PHARMACY CONSULT NOTE - FOLLOW UP  Pharmacy Consult for Electrolyte Monitoring and Replacement   Recent Labs: Potassium (mmol/L)  Date Value  07/11/2024 3.6   Magnesium  (mg/dL)  Date Value  92/78/7974 1.8   Calcium  (mg/dL)  Date Value  92/78/7974 8.0 (L)   Albumin (g/dL)  Date Value  92/79/7974 3.1 (L)   Phosphorus (mg/dL)  Date Value  92/78/7974 3.3   Sodium (mmol/L)  Date Value  07/11/2024 134 (L)     Assessment: 73 y.o with significant PMH of Addison's disease maintained on cortisone and Florinef , arthritis, chickenpox, concussion, diverticulitis, HLD, and GERD who presented to the ED with chief complaints of acute onset abdominal pain, nausea, vomiting and diarrhea.   Goal of Therapy:  WNL  Plan:  No electrolyte replacement currently needed Recheck BMP, Mg, Phos with AM labs  Veronica Ferrell, PharmD Clinical Pharmacist 07/11/2024 7:39 AM

## 2024-07-11 NOTE — Assessment & Plan Note (Signed)
 Present on admission.  Secondary to norovirus.  Patient had tachycardia, tachypnea and hypotension requiring pressors.  Patient also had fever.  Lactic acid initially elevated.  Patient off pressors.  Will convert stress dose steroids over to Cortef  usual dose.

## 2024-07-11 NOTE — Assessment & Plan Note (Signed)
 On Crestor

## 2024-07-11 NOTE — Progress Notes (Signed)
 Progress Note   Patient: Veronica Ferrell FMW:[id number] DOB: 1951/06/29 DOA: 07/09/2024     1 DOS: the patient was seen and examined on 07/11/2024   Brief hospital course: 73 y.o with significant PMH of Addison's disease maintained on cortisone and Florinef , arthritis, chickenpox, concussion, diverticulitis, HLD, and GERD who presented to the ED with chief complaints of acute onset abdominal pain, nausea, vomiting and diarrhea since 8 PM last night.  Patient just returned with her husband from [place] about 24 hours ago.   ED Course: Initial vital signs showed HR of 103 beats/minute, BP 95/74 mm Hg, the RR 20 breaths/minute, and the oxygen saturation 95% on RA and a temperature of 100.47F (38C) Pertinent Labs/Diagnostics Findings: Na+/ K+:135/3.7  Glucose: 94 BUN/Cr.:22/0.83 WBC:8.5 K/L without bands or neutrophil predominance   Lactic acid:3.3  CXR> CTH> CTA Chest> CT Abd/pelvis>neg Medication administered in the ZI:Ejupzwu given 30 cc/kg of fluids and started on broad-spectrum antibiotics Vanco cefepime  and Flagyl  for sepsis with septic shock. Patient remained hypotensive despite IVF boluses therefore was started on Levophed .  (Sepsis reassessment completed). PCCM consulted.  7/21.  Still feels with upset stomach.  No further diarrhea or vomiting.  Stool culture positive for norovirus and Shiga like toxin producing E. coli.  Assessment and Plan: * Septic shock (HCC) Present on admission.  Secondary to norovirus.  Patient had tachycardia, tachypnea and hypotension requiring pressors.  Patient also had fever.  Lactic acid initially elevated.  Patient off pressors.  Will convert stress dose steroids over to Cortef  usual dose.  Acute gastroenteritis Secondary to norovirus and Shiga toxin producing E. coli.  Supportive care.  Addison's disease (HCC) Initially was started on stress dose steroids will convert back over to Cortef .  Patient's blood pressure is normally in the 90s.  Electrolyte  abnormality With hypokalemia and hypomagnesemia and hyponatremia.  Replace magnesium  and potassium today.  Hyperlipidemia, unspecified On Crestor   Acute respiratory failure with hypoxia (HCC) Resolved.  1 pulse ox of 89% on 2 L.  Now on room air        Subjective: Patient still feels like she has an upset stomach.  Has not really eaten too much.  No diarrhea no further vomiting.  Physical Exam: Vitals:   07/11/24 0433 07/11/24 0443 07/11/24 0628 07/11/24 0837  BP: (!) 88/51 (!) 104/57 (!) 98/58 107/63  Pulse: 80 85 83 80  Resp: 18 18 18 16   Temp: 98.4 F (36.9 C) 98.4 F (36.9 C) 98.7 F (37.1 C) 98.2 F (36.8 C)  TempSrc:    Oral  SpO2: 93% 96% 96% 99%  Weight:      Height:       Physical Exam HENT:     Head: Normocephalic.  Eyes:     General: Lids are normal.     Conjunctiva/sclera: Conjunctivae normal.  Cardiovascular:     Rate and Rhythm: Normal rate and regular rhythm.     Heart sounds: Normal heart sounds, S1 normal and S2 normal.  Pulmonary:     Breath sounds: No decreased breath sounds, wheezing, rhonchi or rales.  Abdominal:     Palpations: Abdomen is soft.     Tenderness: There is no abdominal tenderness.  Musculoskeletal:     Right lower leg: No swelling.     Left lower leg: No swelling.  Skin:    General: Skin is warm.     Findings: No rash.  Neurological:     Mental Status: She is alert and oriented to person, place, and  time.     Data Reviewed: Sodium 134, potassium 3.6, creatinine 0.71, magnesium  1.8, white blood cell count 3.6, hemoglobin 11.8, platelet count 250  Disposition: Status is: Inpatient Remains inpatient appropriate because: Patient still not feeling well and is not eating much.  Will replace electrolytes IV today and recheck tomorrow.  Planned Discharge Destination: Home    Time spent: 28 minutes  Author: Charlie Patterson, MD 07/11/2024 1:38 PM  For on call review [email or web address].

## 2024-07-11 NOTE — Assessment & Plan Note (Signed)
 Initially was started on stress dose steroids will convert back over to Cortef .  Patient's blood pressure is normally in the 90s.

## 2024-07-11 NOTE — Plan of Care (Signed)
  Problem: Education: Goal: Knowledge of General Education information will improve Description Including pain rating scale, medication(s)/side effects and non-pharmacologic comfort measures Outcome: Progressing   Problem: Health Behavior/Discharge Planning: Goal: Ability to manage health-related needs will improve Outcome: Progressing   Problem: Clinical Measurements: Goal: Ability to maintain clinical measurements within normal limits will improve Outcome: Progressing Goal: Will remain free from infection Outcome: Progressing Goal: Diagnostic test results will improve Outcome: Progressing Goal: Respiratory complications will improve Outcome: Progressing Goal: Cardiovascular complication will be avoided Outcome: Progressing   Problem: Elimination: Goal: Will not experience complications related to bowel motility Outcome: Progressing Goal: Will not experience complications related to urinary retention Outcome: Progressing   Problem: Safety: Goal: Ability to remain free from injury will improve Outcome: Progressing   

## 2024-07-11 NOTE — Plan of Care (Signed)
   Problem: Education: Goal: Knowledge of General Education information will improve Description Including pain rating scale, medication(s)/side effects and non-pharmacologic comfort measures Outcome: Progressing

## 2024-07-12 DIAGNOSIS — E878 Other disorders of electrolyte and fluid balance, not elsewhere classified: Secondary | ICD-10-CM | POA: Diagnosis not present

## 2024-07-12 DIAGNOSIS — R233 Spontaneous ecchymoses: Secondary | ICD-10-CM | POA: Insufficient documentation

## 2024-07-12 DIAGNOSIS — E876 Hypokalemia: Secondary | ICD-10-CM

## 2024-07-12 DIAGNOSIS — E871 Hypo-osmolality and hyponatremia: Secondary | ICD-10-CM

## 2024-07-12 DIAGNOSIS — K529 Noninfective gastroenteritis and colitis, unspecified: Secondary | ICD-10-CM | POA: Diagnosis not present

## 2024-07-12 DIAGNOSIS — E271 Primary adrenocortical insufficiency: Secondary | ICD-10-CM | POA: Diagnosis not present

## 2024-07-12 DIAGNOSIS — J9601 Acute respiratory failure with hypoxia: Secondary | ICD-10-CM

## 2024-07-12 DIAGNOSIS — A419 Sepsis, unspecified organism: Secondary | ICD-10-CM | POA: Diagnosis not present

## 2024-07-12 LAB — BASIC METABOLIC PANEL WITH GFR
Anion gap: 6 (ref 5–15)
BUN: 10 mg/dL (ref 8–23)
CO2: 26 mmol/L (ref 22–32)
Calcium: 8.2 mg/dL — ABNORMAL LOW (ref 8.9–10.3)
Chloride: 106 mmol/L (ref 98–111)
Creatinine, Ser: 0.65 mg/dL (ref 0.44–1.00)
GFR, Estimated: >60 mL/min (ref >60)
Glucose, Bld: 92 mg/dL (ref 70–99)
Potassium: 4.5 mmol/L (ref 3.5–5.1)
Sodium: 138 mmol/L (ref 135–145)

## 2024-07-12 LAB — MAGNESIUM: Magnesium: 2.1 mg/dL (ref 1.7–2.4)

## 2024-07-12 LAB — PHOSPHORUS: Phosphorus: 2.1 mg/dL — ABNORMAL LOW (ref 2.5–4.6)

## 2024-07-12 LAB — HEMOGLOBIN: Hemoglobin: 12.3 g/dL (ref 12.0–15.0)

## 2024-07-12 MED ORDER — K PHOS MONO-SOD PHOS DI & MONO 155-852-130 MG PO TABS
500.0000 mg | ORAL_TABLET | Freq: Once | ORAL | Status: AC
  Administered 2024-07-12: 500 mg via ORAL
  Filled 2024-07-12: qty 2

## 2024-07-12 MED ORDER — ONDANSETRON HCL 4 MG PO TABS: 4.0000 mg | ORAL_TABLET | Freq: Three times a day (TID) | ORAL | 0 refills | Status: AC | PRN

## 2024-07-12 NOTE — Discharge Summary (Signed)
 Physician Discharge Summary   Patient: Veronica Ferrell MRN: [id number] DOB: 1951-02-10  Admit date:     07/09/2024  Discharge date: 07/12/24  Discharge Physician: Charlie Patterson   PCP: Marylynn Verneita CROME, MD   Recommendations at discharge:   Follow-up PCP  Discharge Diagnoses: Principal Problem:   Septic shock (HCC) Active Problems:   Acute gastroenteritis   Addison's disease (HCC)   Electrolyte abnormality   Hyperlipidemia, unspecified   Norovirus   STEC (Shiga toxin-producing Escherichia coli) infection   Acute respiratory failure with hypoxia (HCC)   Hypokalemia   Hypomagnesemia   Hypophosphatemia   Petechial rash    Hospital Course: 73 y.o with significant PMH of Addison's disease maintained on cortisone and Florinef , arthritis, chickenpox, concussion, diverticulitis, HLD, and GERD who presented to the ED with chief complaints of acute onset abdominal pain, nausea, vomiting and diarrhea since 8 PM last night.  Patient just returned with her husband from [place] about 24 hours ago.   ED Course: Initial vital signs showed HR of 103 beats/minute, BP 95/74 mm Hg, the RR 20 breaths/minute, and the oxygen saturation 95% on RA and a temperature of 100.57F (38C) Pertinent Labs/Diagnostics Findings: Na+/ K+:135/3.7  Glucose: 94 BUN/Cr.:22/0.83 WBC:8.5 K/L without bands or neutrophil predominance   Lactic acid:3.3  CXR> CTH> CTA Chest> CT Abd/pelvis>neg Medication administered in the ZI:Ejupzwu given 30 cc/kg of fluids and started on broad-spectrum antibiotics Vanco cefepime  and Flagyl  for sepsis with septic shock. Patient remained hypotensive despite IVF boluses therefore was started on Levophed .  (Sepsis reassessment completed). PCCM consulted.  7/21.  Still feels with upset stomach.  No further diarrhea or vomiting.  Stool culture positive for norovirus and Shiga like toxin producing E. coli. 7/22.  Patient feeling better.  Tolerating diet.  Patient noticed some bruising along  her shins.  Assessment and Plan: * Septic shock (HCC) Present on admission.  Secondary to norovirus.  Patient had tachycardia, tachypnea and hypotension requiring pressors.  Patient also had fever.  Lactic acid initially elevated.  Patient off pressors.  Converted off stress dose steroids on 7/21 and back on usual dose of Cortef .  Acute gastroenteritis Secondary to norovirus and Shiga toxin producing E. coli.  Supportive care.  As needed Zofran  prescribed.  Patient tolerating diet.  Addison's disease (HCC) Initially was started on stress dose steroids and converted back to her usual Cortef  dose on 7/21.  Fludrocortisone  started on 7/22  Electrolyte abnormality Hypokalemia replaced, hyponatremia resolved, hypomagnesemia replaced.  Patient did have slightly low phosphorus (hypophosphatemia) today and that was replaced orally.  Hyperlipidemia, unspecified On Crestor   Petechial rash Likely with viral infection.  Last platelet count 250.  Monitor as outpatient.  Discontinued Lovenox .  Acute respiratory failure with hypoxia (HCC) Resolved.  1 pulse ox of 89% on 2 L.  Now on room air         Consultants: None Procedures performed: None Disposition: Home Diet recommendation:  Regular DISCHARGE MEDICATION: Allergies as of 07/12/2024       Reactions   Alendronate Sodium Nausea Only   Boniva [ibandronate] Nausea And Vomiting   Clindamycin/lincomycin Dermatitis   Tramadol    Red face   Penicillins Rash        Medication List     STOP taking these medications    Lactulose  20 GM/30ML Soln   ondansetron  4 MG disintegrating tablet Commonly known as: ZOFRAN -ODT   polyethylene glycol powder 17 GM/SCOOP powder Commonly known as: GLYCOLAX /MIRALAX    triamcinolone  cream 0.1 % Commonly known  as: KENALOG        TAKE these medications    ALPRAZolam  1 MG tablet Commonly known as: XANAX  TAKE 1 TABLET BY MOUTH AT BEDTIME AS NEEDED FOR ANXIETY   ascorbic acid 500 MG  tablet Commonly known as: VITAMIN C Take 500 mg by mouth daily.   aspirin  81 MG tablet Take 81 mg by mouth once a week.   calcium  citrate 950 (200 Ca) MG tablet Commonly known as: CALCITRATE - dosed in mg elemental calcium  Take 1 tablet by mouth daily.   cholecalciferol 25 MCG (1000 UNIT) tablet Commonly known as: VITAMIN D3 Take 3 tablets (3,000 Units total) by mouth daily.   diclofenac  Sodium 1 % Gel Commonly known as: VOLTAREN  APPLY 2 GRAMS TOPICALLY FOUR TIMES DAILY   estradiol  1 MG tablet Commonly known as: ESTRACE  Take 1 tablet (1 mg total) by mouth daily.   fludrocortisone  0.1 MG tablet Commonly known as: FLORINEF  TAKE ONE TABLET BY MOUTH DAILY   hydrocortisone  20 MG tablet Commonly known as: CORTEF  TAKE ONE TABLET BY MOUTH EVERY MORNING AND TAKE ONE-HALF TABLET BY MOUTH EVERY NIGHT AS DIRECTED   ipratropium 0.06 % nasal spray Commonly known as: ATROVENT  USE 2 SPRAYS IN BOTH NOSTRILS FOUR TIMESDAILY AS DIRECTED.   mirtazapine  7.5 MG tablet Commonly known as: REMERON  TAKE ONE TABLET BY MOUTH AT BEDTIME   multivitamin with minerals tablet Take 1 tablet by mouth daily.   omeprazole  40 MG capsule Commonly known as: PRILOSEC TAKE 1 CAPSULE BY MOUTH TWICE DAILY TAKETAKE 1 CAPSULE BY MOUTH ONCE DAILY   ondansetron  4 MG tablet Commonly known as: Zofran  Take 1 tablet (4 mg total) by mouth every 8 (eight) hours as needed for nausea or vomiting.   promethazine  12.5 MG tablet Commonly known as: PHENERGAN  Take 1 tablet (12.5 mg total) by mouth every 8 (eight) hours as needed for nausea or vomiting.   rosuvastatin  5 MG tablet Commonly known as: CRESTOR  Take 1 tablet (5 mg total) by mouth daily.        Follow-up Information     Marylynn Verneita CROME, MD Follow up.   Specialty: Internal Medicine Why: keep appointment scheduled.  can follow up earlier if any issues Contact information: [address] [place] [address] [phone]                 Discharge Exam: Filed Weights   07/10/24 0845 07/11/24 0120 07/12/24 0416  Weight: 57 kg 57.5 kg 58.8 kg   Physical Exam HENT:     Head: Normocephalic.  Eyes:     General: Lids are normal.     Conjunctiva/sclera: Conjunctivae normal.  Cardiovascular:     Rate and Rhythm: Normal rate and regular rhythm.     Heart sounds: Normal heart sounds, S1 normal and S2 normal.  Pulmonary:     Breath sounds: No decreased breath sounds, wheezing, rhonchi or rales.  Abdominal:     Palpations: Abdomen is soft.     Tenderness: There is no abdominal tenderness.  Musculoskeletal:     Right lower leg: No swelling.     Left lower leg: No swelling.  Skin:    General: Skin is warm.     Comments: Petechial like bruising rash bilateral lower extremities.  Nonblanching.  Neurological:     Mental Status: She is alert and oriented to person, place, and time.      Condition at discharge: stable  The results of significant diagnostics from this hospitalization (including imaging, microbiology, ancillary  and laboratory) are listed below for reference.   Imaging Studies: CT Angio Chest PE W/Cm &/Or Wo Cm Result Date: 07/10/2024 EXAM: CTA CHEST PE WITHOUT AND WITH CONTRAST CT ABDOMEN AND PELVIS WITHOUT AND WITH CONTRAST 07/10/2024 02:37:15 AM TECHNIQUE: CTA of the chest was performed after the administration of intravenous contrast. Multiplanar reformatted images are provided for review. MIP images are provided for review. CT of the abdomen and pelvis was performed with the administration of intravenous contrast. Automated exposure control, iterative reconstruction, and/or weight based adjustment of the mA/kV was utilized to reduce the radiation dose to as low as reasonably achievable. COMPARISON: CT abdomen and pelvis dated 08/28/2023. Partial comparison to cardiac CT dated 10/09/2022. CLINICAL HISTORY: Pulmonary embolism (PE) suspected, high prob. Per ed notes; BIB ems from home for CP, nausea and  vomiting. Sudden onset started around 2000 tonight. Just flew back from [place] about 24hrs pta. Was given 2 nitroglycerin , 4mg  zofran  pta. FINDINGS: CHEST: PULMONARY ARTERIES: Pulmonary arteries are adequately opacified for evaluation. No evidence of pulmonary embolism. MEDIASTINUM: No mediastinal lymphadenopathy. The heart and pericardium demonstrate no acute abnormality. Although not tailored for evaluation of the thoracic aorta, there is no evidence of thoracic aortic aneurysm or dissection. Mild thoracic aortic atherosclerosis. LUNGS AND PLEURA: 5 mm triangular subpleural nodule in the right middle lobe ( image 93 ), unchanged, benign. No follow-up is recommended per [facility] Society guidelines. Mild dependent atelectasis in the posterior right upper lobe and bilateral lower lobes. No focal consolidation or pulmonary edema. No pleural effusion or pneumothorax. SOFT TISSUES AND BONES: Stable fluid density lesion along the right lateral aspect of the T10 vertebral body (image 108), favoring a benign perineural cyst. Mild degenerative changes of the lower thoracic spine. ABDOMEN AND PELVIS: LIVER: Mild periportal edema. GALLBLADDER AND BILE DUCTS: Mild layering gallbladder sludge (image 34), without associated inflammatory changes. SPLEEN: Spleen demonstrates no acute abnormality. PANCREAS: Pancreas demonstrates no acute abnormality. ADRENAL GLANDS: Adrenal glands demonstrate no acute abnormality. KIDNEYS, URETERS AND BLADDER: Stable 3 mm nonobstructing left renal calculus ( image 29 ). No hydronephrosis. No perinephric or periureteral stranding. Urinary bladder is unremarkable. GI AND BOWEL: Small hiatal hernia. Stomach and duodenal sweep demonstrate no acute abnormality. There is no bowel obstruction. No abnormal bowel wall thickening or distension. Scattered sigmoid diverticulosis, without evidence of diverticulitis. REPRODUCTIVE: Status post hysterectomy. PERITONEUM AND RETROPERITONEUM: No ascites or free air.  LYMPH NODES: No lymphadenopathy. BONES AND SOFT TISSUES: No acute abnormality of the visualized bones. No focal soft tissue abnormality. IMPRESSION: 1. No evidence of pulmonary embolism. 2. No acute findings in the chest, abdomen, or pelvis. 3. Additional stable ancillary findings as above. Electronically signed by: Pinkie Pebbles MD 07/10/2024 02:50 AM EDT RP Workstation: [id number]   CT ABDOMEN PELVIS W CONTRAST Result Date: 07/10/2024 EXAM: CTA CHEST PE WITHOUT AND WITH CONTRAST CT ABDOMEN AND PELVIS WITHOUT AND WITH CONTRAST 07/10/2024 02:37:15 AM TECHNIQUE: CTA of the chest was performed after the administration of intravenous contrast. Multiplanar reformatted images are provided for review. MIP images are provided for review. CT of the abdomen and pelvis was performed with the administration of intravenous contrast. Automated exposure control, iterative reconstruction, and/or weight based adjustment of the mA/kV was utilized to reduce the radiation dose to as low as reasonably achievable. COMPARISON: CT abdomen and pelvis dated 08/28/2023. Partial comparison to cardiac CT dated 10/09/2022. CLINICAL HISTORY: Pulmonary embolism (PE) suspected, high prob. Per ed notes; BIB ems from home for CP, nausea and vomiting. Sudden onset started around 2000  tonight. Just flew back from [place] about 24hrs pta. Was given 2 nitroglycerin , 4mg  zofran  pta. FINDINGS: CHEST: PULMONARY ARTERIES: Pulmonary arteries are adequately opacified for evaluation. No evidence of pulmonary embolism. MEDIASTINUM: No mediastinal lymphadenopathy. The heart and pericardium demonstrate no acute abnormality. Although not tailored for evaluation of the thoracic aorta, there is no evidence of thoracic aortic aneurysm or dissection. Mild thoracic aortic atherosclerosis. LUNGS AND PLEURA: 5 mm triangular subpleural nodule in the right middle lobe ( image 93 ), unchanged, benign. No follow-up is recommended per [facility] Society guidelines. Mild  dependent atelectasis in the posterior right upper lobe and bilateral lower lobes. No focal consolidation or pulmonary edema. No pleural effusion or pneumothorax. SOFT TISSUES AND BONES: Stable fluid density lesion along the right lateral aspect of the T10 vertebral body (image 108), favoring a benign perineural cyst. Mild degenerative changes of the lower thoracic spine. ABDOMEN AND PELVIS: LIVER: Mild periportal edema. GALLBLADDER AND BILE DUCTS: Mild layering gallbladder sludge (image 34), without associated inflammatory changes. SPLEEN: Spleen demonstrates no acute abnormality. PANCREAS: Pancreas demonstrates no acute abnormality. ADRENAL GLANDS: Adrenal glands demonstrate no acute abnormality. KIDNEYS, URETERS AND BLADDER: Stable 3 mm nonobstructing left renal calculus ( image 29 ). No hydronephrosis. No perinephric or periureteral stranding. Urinary bladder is unremarkable. GI AND BOWEL: Small hiatal hernia. Stomach and duodenal sweep demonstrate no acute abnormality. There is no bowel obstruction. No abnormal bowel wall thickening or distension. Scattered sigmoid diverticulosis, without evidence of diverticulitis. REPRODUCTIVE: Status post hysterectomy. PERITONEUM AND RETROPERITONEUM: No ascites or free air. LYMPH NODES: No lymphadenopathy. BONES AND SOFT TISSUES: No acute abnormality of the visualized bones. No focal soft tissue abnormality. IMPRESSION: 1. No evidence of pulmonary embolism. 2. No acute findings in the chest, abdomen, or pelvis. 3. Additional stable ancillary findings as above. Electronically signed by: Pinkie Pebbles MD 07/10/2024 02:50 AM EDT RP Workstation: [id number]   DG Chest Port 1 View Result Date: 07/10/2024 EXAM: 1 VIEW XRAY OF THE CHEST 07/10/2024 12:46:00 AM COMPARISON: 07/27/2023 CLINICAL HISTORY: Questionable sepsis - evaluate for abnormality. IB ems from home for CP, nausea and vomiting. Sudden onset started around 2000 tonight. Just flew back from [place] about 24hrs pta.  Was given 2 nitroglycerin , 4mg  zofran  pta. FINDINGS: LUNGS AND PLEURA: No focal pulmonary opacity. No pulmonary edema. No pleural effusion. No pneumothorax. HEART AND MEDIASTINUM: No acute abnormality of the cardiac and mediastinal silhouettes. BONES AND SOFT TISSUES: No acute osseous abnormality. IMPRESSION: 1. No acute process. Electronically signed by: Pinkie Pebbles MD 07/10/2024 12:50 AM EDT RP Workstation: [id number]    Microbiology: Results for orders placed or performed during the hospital encounter of 07/09/24  Blood Culture (routine x 2)     Status: None (Preliminary result)   Collection Time: 07/10/24  3:54 AM   Specimen: BLOOD  Result Value Ref Range Status   Specimen Description BLOOD BLOOD LEFT FOREARM  Final   Special Requests   Final    BOTTLES DRAWN AEROBIC AND ANAEROBIC Blood Culture results may not be optimal due to an inadequate volume of blood received in culture bottles   Culture   Final    NO GROWTH 1 DAY Performed at [facility], [address] [place], [place] [address]    Report Status PENDING  Incomplete  Blood Culture (routine x 2)     Status: None (Preliminary result)   Collection Time: 07/10/24  3:55 AM   Specimen: BLOOD  Result Value Ref Range Status   Specimen Description BLOOD RIGHT ANTECUBITAL  Final   Special Requests   Final    BOTTLES DRAWN AEROBIC ONLY Blood Culture results may not be optimal due to an inadequate volume of blood received in culture bottles   Culture   Final    NO GROWTH 1 DAY Performed at [facility], [address] [place], [place] [address]    Report Status PENDING  Incomplete  SARS Coronavirus 2 by RT PCR (hospital order, performed in [facility] hospital lab) *cepheid single result test* Nasopharyngeal Swab     Status: None   Collection Time: 07/10/24  5:40 AM   Specimen: Nasopharyngeal Swab; Nasal Swab  Result Value Ref Range Status   SARS Coronavirus 2 by RT PCR NEGATIVE NEGATIVE Final    Comment:  Performed at [facility], [address] [place], [place] [address]  Respiratory (~20 pathogens) panel by PCR     Status: None   Collection Time: 07/10/24  5:40 AM   Specimen: Nasopharyngeal Swab; Respiratory  Result Value Ref Range Status   Adenovirus NOT DETECTED NOT DETECTED Final   Coronavirus 229E NOT DETECTED NOT DETECTED Final    Comment: (NOTE) The Coronavirus on the Respiratory Panel, DOES NOT test for the novel  Coronavirus (2019 nCoV)    Coronavirus HKU1 NOT DETECTED NOT DETECTED Final   Coronavirus NL63 NOT DETECTED NOT DETECTED Final   Coronavirus OC43 NOT DETECTED NOT DETECTED Final   Metapneumovirus NOT DETECTED NOT DETECTED Final   Rhinovirus / Enterovirus NOT DETECTED NOT DETECTED Final   Influenza A NOT DETECTED NOT DETECTED Final   Influenza B NOT DETECTED NOT DETECTED Final   Parainfluenza Virus 1 NOT DETECTED NOT DETECTED Final   Parainfluenza Virus 2 NOT DETECTED NOT DETECTED Final   Parainfluenza Virus 3 NOT DETECTED NOT DETECTED Final   Parainfluenza Virus 4 NOT DETECTED NOT DETECTED Final   Respiratory Syncytial Virus NOT DETECTED NOT DETECTED Final   Bordetella pertussis NOT DETECTED NOT DETECTED Final   Bordetella Parapertussis NOT DETECTED NOT DETECTED Final   Chlamydophila pneumoniae NOT DETECTED NOT DETECTED Final   Mycoplasma pneumoniae NOT DETECTED NOT DETECTED Final    Comment: Performed at [facility], [address] [place], [place] [address]  Gastrointestinal Panel by PCR , Stool     Status: Abnormal   Collection Time: 07/10/24  9:55 AM   Specimen: Stool  Result Value Ref Range Status   Campylobacter species NOT DETECTED NOT DETECTED Final   Plesimonas shigelloides NOT DETECTED NOT DETECTED Final   Salmonella species NOT DETECTED NOT DETECTED Final   Yersinia enterocolitica NOT DETECTED NOT DETECTED Final   Vibrio species NOT DETECTED NOT DETECTED Final   Vibrio cholerae NOT DETECTED NOT DETECTED Final   Enteroaggregative E  coli (EAEC) NOT DETECTED NOT DETECTED Final   Enterotoxigenic E coli (ETEC) NOT DETECTED NOT DETECTED Final   Shiga like toxin producing E coli (STEC) DETECTED (A) NOT DETECTED Final    Comment: RESULT CALLED TO, READ BACK BY AND VERIFIED WITH: MITCHELL FAULK @1152  07/10/24 MJU    E. coli O157 NOT DETECTED NOT DETECTED Final   Shigella/Enteroinvasive E coli (EIEC) NOT DETECTED NOT DETECTED Final   Cryptosporidium NOT DETECTED NOT DETECTED Final   Cyclospora cayetanensis NOT DETECTED NOT DETECTED Final   Entamoeba histolytica NOT DETECTED NOT DETECTED Final   Giardia lamblia NOT DETECTED NOT DETECTED Final   Adenovirus F40/41 NOT DETECTED NOT DETECTED Final   Astrovirus NOT DETECTED NOT DETECTED Final   Norovirus GI/GII DETECTED (  A) NOT DETECTED Final    Comment: RESULT CALLED TO, READ BACK BY AND VERIFIED WITH: MITCHELL FAULK @1152  07/10/24 MJU    Rotavirus A NOT DETECTED NOT DETECTED Final   Sapovirus (I, II, IV, and V) NOT DETECTED NOT DETECTED Final    Comment: Performed at [facility], [address] [place], [place] [address]  C Difficile Quick Screen w PCR reflex     Status: None   Collection Time: 07/10/24  9:55 AM   Specimen: STOOL  Result Value Ref Range Status   C Diff antigen NEGATIVE NEGATIVE Final   C Diff toxin NEGATIVE NEGATIVE Final   C Diff interpretation No C. difficile detected.  Final    Comment: Performed at [facility], [address] [place], [place] [address]  MRSA Next Gen by PCR, Nasal     Status: None   Collection Time: 07/10/24 10:09 PM  Result Value Ref Range Status   MRSA by PCR Next Gen NOT DETECTED NOT DETECTED Final    Comment: (NOTE) The GeneXpert MRSA Assay (FDA approved for NASAL specimens only), is one component of a comprehensive MRSA colonization surveillance program. It is not intended to diagnose MRSA infection nor to guide or monitor treatment for MRSA infections. Test performance is not FDA approved in patients  less than 58 years old. Performed at [facility], [address] [place], [place] [address]     Labs: CBC: Recent Labs  Lab 07/10/24 0009 07/11/24 0516 07/12/24 0641  WBC 8.5 3.6*  --   NEUTROABS 7.1  --   --   HGB 15.3* 11.8* 12.3  HCT 46.1* 35.3*  --   MCV 98.7 98.3  --   PLT 256 250  --    Basic Metabolic Panel: Recent Labs  Lab 07/10/24 0136 07/11/24 0516 07/12/24 0641  NA 135 134* 138  K 3.7 3.6 4.5  CL 106 102 106  CO2 20* 23 26  GLUCOSE 94 96 92  BUN 22 10 10   CREATININE 0.83 0.71 0.65  CALCIUM  8.4* 8.0* 8.2*  MG  --  1.8 2.1  PHOS  --  3.3 2.1*   Liver Function Tests: Recent Labs  Lab 07/10/24 0136  AST 25  ALT 15  ALKPHOS 21*  BILITOT 0.8  PROT 5.3*  ALBUMIN 3.1*   CBG: Recent Labs  Lab 07/10/24 0852  GLUCAP 100*    Discharge time spent: greater than 30 minutes.  Signed: Charlie Patterson, MD [facility] 07/12/2024

## 2024-07-12 NOTE — Assessment & Plan Note (Signed)
 Likely with viral infection.  Last platelet count 250.  Monitor as outpatient.  Discontinued Lovenox .

## 2024-07-13 ENCOUNTER — Encounter: Payer: Self-pay | Admitting: Internal Medicine

## 2024-07-13 ENCOUNTER — Telehealth: Payer: Self-pay

## 2024-07-13 DIAGNOSIS — R5383 Other fatigue: Secondary | ICD-10-CM

## 2024-07-13 DIAGNOSIS — E785 Hyperlipidemia, unspecified: Secondary | ICD-10-CM

## 2024-07-13 DIAGNOSIS — D696 Thrombocytopenia, unspecified: Secondary | ICD-10-CM

## 2024-07-13 DIAGNOSIS — R7303 Prediabetes: Secondary | ICD-10-CM

## 2024-07-13 NOTE — Addendum Note (Signed)
 Addended by: MARYLYNN VERNEITA CROME on: 07/13/2024 06:00 PM   Modules accepted: Orders

## 2024-07-13 NOTE — Addendum Note (Signed)
 Addended by: Saraih Lorton on: 07/13/2024 03:53 PM   Modules accepted: Orders

## 2024-07-13 NOTE — Patient Instructions (Addendum)
 Visit Information  Thank you for taking time to visit with me today. Please don't hesitate to contact me if I can be of assistance to you.   Patient instructions:  Drink plenty of water Keep follow up appointments with your provider Please send your primary care provider a Mychart message letting her know you were in the hospital recently.  Eat bland, easy - to -digest foods in small amounts as you are able.  These foods include:  rice, lean meats, toast, crackers and avoid spicy or fatty foods.  Rest at home Take medications as prescribed Wash your hands thoroughly for at least 20 seconds Contact your provider for any new or ongoing symptoms Call 911 for severe symptoms.   Patient verbalizes understanding of instructions and care plan provided today and agrees to view in MyChart. Active MyChart status and patient understanding of how to access instructions and care plan via MyChart confirmed with patient.     The patient has been provided with contact information for the care management team and has been advised to call with any health related questions or concerns.   Please call the care guide team at [phone] if you need to cancel or reschedule your appointment.   Please call the Suicide and Crisis Lifeline: 988 call 1-800-273-TALK (toll free, 24 hour hotline) if you are experiencing a Mental Health or Behavioral Health Crisis or need someone to talk to.  Arvin Seip RN, BSN, CCM [employer], Population Health Case Manager Phone: [phone]

## 2024-07-13 NOTE — Transitions of Care (Post Inpatient/ED Visit) (Signed)
 07/13/2024  Name: Veronica Ferrell MRN: [id number] DOB: 1951/05/19  Today's TOC FU Call Status: Today's TOC FU Call Status:: Successful TOC FU Call Completed TOC FU Call Complete Date: 07/13/24 Patient's Name and Date of Birth confirmed.  Transition Care Management Follow-up Telephone Call Date of Discharge: 07/12/24 Discharge Facility: [facility]) Type of Discharge: Inpatient Admission Primary Inpatient Discharge Diagnosis:: Norovirus How have you been since you were released from the hospital?: Better (patient states she is doing better however still feels weak and washed out. Patient denies any nausea and/ vomiting and states she is eating light such as broth, rice, boiled egg.) Any questions or concerns?: No  Items Reviewed: Did you receive and understand the discharge instructions provided?: Yes Medications obtained,verified, and reconciled?: Yes (Medications Reviewed) Any new allergies since your discharge?: No Dietary orders reviewed?: Yes Type of Diet Ordered:: regular Do you have support at home?: Yes People in Home [RPT]: spouse Name of Support/Comfort Primary Source: [employer] [place]  Medications Reviewed Today: Medications Reviewed Today     Reviewed by Roddie Riegler E, RN (Registered Nurse) on 07/13/24 at 1416  Med List Status: <None>   Medication Order Taking? Sig Documenting Provider Last Dose Status Informant  ALPRAZolam  (XANAX ) 1 MG tablet [id number] Yes TAKE 1 TABLET BY MOUTH AT BEDTIME AS NEEDED FOR ANXIETY Marylynn Verneita CROME, MD  Active   aspirin  81 MG tablet [id number] Yes Take 81 mg by mouth once a week.  [provider]  Active   calcium  citrate (CALCITRATE - DOSED IN MG ELEMENTAL CALCIUM ) 950 MG tablet [id number] Yes Take 1 tablet by mouth daily. [provider]  Active   cholecalciferol (VITAMIN D ) 25 MCG (1000 UNIT) tablet [id number] Yes Take 3 tablets (3,000 Units total) by mouth daily. Marylynn Verneita CROME, MD  Active             Med Note ALVENIA, ANGELINE BROCKS   Wed Aug 19, 2023  2:57 PM) Takes one daily  diclofenac  Sodium (VOLTAREN ) 1 % GEL [id number] Yes APPLY 2 GRAMS TOPICALLY FOUR TIMES DAILY Marylynn Verneita CROME, MD  Active            Med Note (LAWS, REGINA C   Wed Aug 19, 2023  2:57 PM) As needed  estradiol  (ESTRACE ) 1 MG tablet [id number] Yes Take 1 tablet (1 mg total) by mouth daily. Marylynn Verneita CROME, MD  Active   fludrocortisone  (FLORINEF ) 0.1 MG tablet [id number] Yes TAKE ONE TABLET BY MOUTH DAILY Tullo, Teresa L, MD  Active   hydrocortisone  (CORTEF ) 20 MG tablet [id number] Yes TAKE ONE TABLET BY MOUTH EVERY MORNING AND TAKE ONE-HALF TABLET BY MOUTH EVERY NIGHT AS DIRECTED Marylynn Verneita CROME, MD  Active   ipratropium (ATROVENT ) 0.06 % nasal spray [id number] Yes USE 2 SPRAYS IN BOTH NOSTRILS FOUR TIMESDAILY AS DIRECTED. Marylynn Verneita CROME, MD  Active   mirtazapine  (REMERON ) 7.5 MG tablet [id number] Yes TAKE ONE TABLET BY MOUTH AT BEDTIME Tullo, Teresa L, MD  Active   Multiple Vitamins-Minerals (MULTIVITAMIN WITH MINERALS) tablet [id number] Yes Take 1 tablet by mouth daily. [provider]  Active   omeprazole  (PRILOSEC) 40 MG capsule [id number] Yes TAKE 1 CAPSULE BY MOUTH TWICE DAILY TAKETAKE 1 CAPSULE BY MOUTH ONCE DAILY Marylynn Verneita CROME, MD  Active   ondansetron  (ZOFRAN ) 4 MG tablet [id number] Yes Take 1 tablet (4 mg total) by mouth every 8 (eight) hours as needed for nausea or vomiting. Josette Ade, MD  Active   promethazine  (PHENERGAN )  12.5 MG tablet [id number] Yes Take 1 tablet (12.5 mg total) by mouth every 8 (eight) hours as needed for nausea or vomiting. Marylynn Verneita CROME, MD  Active   rosuvastatin  (CRESTOR ) 5 MG tablet [id number] Yes Take 1 tablet (5 mg total) by mouth daily. Marylynn Verneita CROME, MD  Active   vitamin C (ASCORBIC ACID) 500 MG tablet [id number] Yes Take 500 mg by mouth daily. [provider]  Active             Home Care and Equipment/Supplies: Were Home Health Services Ordered?: No Any new  equipment or medical supplies ordered?: No  Functional Questionnaire: Do you need assistance with bathing/showering or dressing?: No Do you need assistance with meal preparation?: No Do you need assistance with eating?: No Do you have difficulty maintaining continence: No Do you need assistance with getting out of bed/getting out of a chair/moving?: No Do you have difficulty managing or taking your medications?: No  Follow up appointments reviewed: PCP Follow-up appointment confirmed?: Yes Date of PCP follow-up appointment?: 07/28/24 (patient states she feels it would be redundant to call and make an earlier primary provider appointment.  Advised by this RN case manager to send primary a message to let her know she was discharged from the hospital.) Follow-up Provider: Dr. Verneita Marylynn Specialist [facility] Follow-up appointment confirmed?: NA Do you need transportation to your follow-up appointment?: No Do you understand care options if your condition(s) worsen?: Yes-patient verbalized understanding  SDOH Interventions Today    Flowsheet Row Most Recent Value  SDOH Interventions   Food Insecurity Interventions Intervention Not Indicated  Housing Interventions Intervention Not Indicated  Transportation Interventions Intervention Not Indicated  Utilities Interventions Intervention Not Indicated   Discussed and offered 30 day TOC program.  Patient declined. The patient has been provided with contact information for the care management team and has been advised to call with any health -related questions or concerns.  The patient verbalized understanding with current plan of care.  The patient is directed to their insurance card regarding availability of benefits coverage.    Arvin Seip RN, BSN, CCM [employer], Population Health Case Manager Phone: [phone]

## 2024-07-13 NOTE — Telephone Encounter (Signed)
 Copied from CRM [id number]. Topic: Clinical - Medical Advice >> Jul 13, 2024 10:59 AM Chasity T wrote: Reason for CRM: Patient is requesting for her labs to be ordered before her appointment pcp appointment on aug 7th and wants a call back.

## 2024-07-13 NOTE — Telephone Encounter (Signed)
 Labs pended.

## 2024-07-13 NOTE — Telephone Encounter (Signed)
 Pt has appt for 08/07. Would you like to have pt come in sooner?

## 2024-07-14 ENCOUNTER — Encounter: Payer: Self-pay | Admitting: Internal Medicine

## 2024-07-14 NOTE — Telephone Encounter (Signed)
 Noted

## 2024-07-15 LAB — CULTURE, BLOOD (ROUTINE X 2)
Culture: NO GROWTH
Culture: NO GROWTH

## 2024-07-18 ENCOUNTER — Telehealth: Payer: Self-pay

## 2024-07-18 ENCOUNTER — Other Ambulatory Visit (INDEPENDENT_AMBULATORY_CARE_PROVIDER_SITE_OTHER)

## 2024-07-18 DIAGNOSIS — D699 Hemorrhagic condition, unspecified: Secondary | ICD-10-CM | POA: Diagnosis not present

## 2024-07-18 DIAGNOSIS — E785 Hyperlipidemia, unspecified: Secondary | ICD-10-CM

## 2024-07-18 DIAGNOSIS — D696 Thrombocytopenia, unspecified: Secondary | ICD-10-CM

## 2024-07-18 DIAGNOSIS — R7303 Prediabetes: Secondary | ICD-10-CM

## 2024-07-18 DIAGNOSIS — R5383 Other fatigue: Secondary | ICD-10-CM

## 2024-07-18 NOTE — Telephone Encounter (Signed)
 I am sorry I forgot to put that she was Dx with norovirus and e coli on 7/20 in ED

## 2024-07-18 NOTE — Addendum Note (Signed)
 Addended by: MARYLEN PRO A on: 07/18/2024 10:14 AM   Modules accepted: Orders

## 2024-07-18 NOTE — Telephone Encounter (Signed)
 Pt called wanting to see if she would need to reschedule her colonoscopy on August 5th. Pt reports her bowel movements have returned to normal, no symptoms. Does she need to be moved 6 weeks out?

## 2024-07-18 NOTE — Telephone Encounter (Signed)
 Pt is aware and procedure has been rescheduled

## 2024-07-19 LAB — CBC WITH DIFFERENTIAL/PLATELET
Absolute Lymphocytes: 2353 {cells}/uL (ref 850–3900)
Absolute Monocytes: 784 {cells}/uL (ref 200–950)
Basophils Absolute: 53 {cells}/uL (ref 0–200)
Basophils Relative: 0.5 %
Eosinophils Absolute: 85 {cells}/uL (ref 15–500)
Eosinophils Relative: 0.8 %
HCT: 41.4 % (ref 35.0–45.0)
Hemoglobin: 12.9 g/dL (ref 11.7–15.5)
MCH: 31.9 pg (ref 27.0–33.0)
MCHC: 31.2 g/dL — ABNORMAL LOW (ref 32.0–36.0)
MCV: 102.2 fL — ABNORMAL HIGH (ref 80.0–100.0)
MPV: 9.2 fL (ref 7.5–12.5)
Monocytes Relative: 7.4 %
Neutro Abs: 7325 {cells}/uL (ref 1500–7800)
Neutrophils Relative %: 69.1 %
Platelets: 450 Thousand/uL — ABNORMAL HIGH (ref 140–400)
RBC: 4.05 Million/uL (ref 3.80–5.10)
RDW: 11.7 % (ref 11.0–15.0)
Total Lymphocyte: 22.2 %
WBC: 10.6 Thousand/uL (ref 3.8–10.8)

## 2024-07-19 LAB — COMPREHENSIVE METABOLIC PANEL WITH GFR
AG Ratio: 1.8 (calc) (ref 1.0–2.5)
ALT: 56 U/L — ABNORMAL HIGH (ref 6–29)
AST: 22 U/L (ref 10–35)
Albumin: 4 g/dL (ref 3.6–5.1)
Alkaline phosphatase (APISO): 23 U/L — ABNORMAL LOW (ref 37–153)
BUN: 17 mg/dL (ref 7–25)
CO2: 25 mmol/L (ref 20–32)
Calcium: 9.4 mg/dL (ref 8.6–10.4)
Chloride: 100 mmol/L (ref 98–110)
Creat: 0.96 mg/dL (ref 0.60–1.00)
Globulin: 2.2 g/dL (ref 1.9–3.7)
Glucose, Bld: 99 mg/dL (ref 65–99)
Potassium: 4.9 mmol/L (ref 3.5–5.3)
Sodium: 136 mmol/L (ref 135–146)
Total Bilirubin: 0.3 mg/dL (ref 0.2–1.2)
Total Protein: 6.2 g/dL (ref 6.1–8.1)
eGFR: 62 mL/min/1.73m2 (ref >=60)

## 2024-07-19 LAB — LIPID PANEL
Cholesterol: 161 mg/dL (ref <200)
HDL: 75 mg/dL (ref >=50)
LDL Cholesterol (Calc): 68 mg/dL
Non-HDL Cholesterol (Calc): 86 mg/dL (ref <130)
Total CHOL/HDL Ratio: 2.1 (calc) (ref <5.0)
Triglycerides: 99 mg/dL (ref <150)

## 2024-07-19 LAB — TSH: TSH: 1.16 m[IU]/L (ref 0.40–4.50)

## 2024-07-19 LAB — CALCIUM, IONIZED: Calcium, Ion: 5.2 mg/dL (ref 4.7–5.5)

## 2024-07-19 LAB — PHOSPHORUS: Phosphorus: 4.2 mg/dL (ref 2.1–4.3)

## 2024-07-20 ENCOUNTER — Ambulatory Visit: Payer: Self-pay | Admitting: Internal Medicine

## 2024-07-28 ENCOUNTER — Ambulatory Visit: Payer: Medicare PPO | Admitting: Internal Medicine

## 2024-07-28 ENCOUNTER — Encounter: Payer: Self-pay | Admitting: Internal Medicine

## 2024-07-28 VITALS — BP 100/60 | HR 81 | Temp 98.2°F | Ht 64.0 in | Wt 121.8 lb

## 2024-07-28 DIAGNOSIS — K219 Gastro-esophageal reflux disease without esophagitis: Secondary | ICD-10-CM

## 2024-07-28 DIAGNOSIS — R233 Spontaneous ecchymoses: Secondary | ICD-10-CM

## 2024-07-28 DIAGNOSIS — E530 Riboflavin deficiency: Secondary | ICD-10-CM | POA: Diagnosis not present

## 2024-07-28 DIAGNOSIS — A498 Other bacterial infections of unspecified site: Secondary | ICD-10-CM | POA: Diagnosis not present

## 2024-07-28 DIAGNOSIS — K222 Esophageal obstruction: Secondary | ICD-10-CM | POA: Diagnosis not present

## 2024-07-28 DIAGNOSIS — R5383 Other fatigue: Secondary | ICD-10-CM

## 2024-07-28 MED ORDER — OMEPRAZOLE 40 MG PO CPDR: DELAYED_RELEASE_CAPSULE | ORAL | 1 refills | Status: AC

## 2024-07-28 MED ORDER — CYANOCOBALAMIN 1000 MCG/ML IJ SOLN
1000.0000 ug | Freq: Once | INTRAMUSCULAR | Status: AC
  Administered 2024-07-28: 1000 ug via INTRAMUSCULAR

## 2024-07-28 MED ORDER — ALPRAZOLAM 1 MG PO TABS: ORAL_TABLET | ORAL | 5 refills | Status: AC

## 2024-07-28 MED ORDER — MIRTAZAPINE 7.5 MG PO TABS: 7.5000 mg | ORAL_TABLET | Freq: Every day | ORAL | 1 refills | Status: AC

## 2024-07-28 NOTE — Progress Notes (Signed)
 Subjective:  Patient ID: Veronica Ferrell, female    DOB: [birth year]  Age: 73 y.o. MRN: [id number]  CC: The primary encounter diagnosis was Vitamin B2 deficiency. Diagnoses of GERD with stricture, STEC (Shiga toxin-producing Escherichia coli) infection, Petechial rash, Hypophosphatemia, and Fatigue, unspecified type were also pertinent to this visit.   HPI Veronica Ferrell presents for  Chief Complaint  Patient presents with   Medical Management of Chronic Issues    6 month follow up    Veronica Chock was admitted to [facility] with sepsis on July 19 in the setting of abd pain/N/V/D requiring levophed  for persistent hypotension despite fluid resuscitation.  Empiric broad spectrum abx were started along with stress dose steroids given her concurrent history of  Addison's disease.  Workup  included CT chest abd/pelvis,  stool studies confirmed norovirus and STEC (shiga producing E Coli) .  She improved steadily and was discharged on July 22 in improved condition and advised to resume her home steroid regimen of hydrocortisone  20 mg / 10 mg  and florinef  0.1 mg daily   Post discharge labs ON JULY 28 to monitor for recurrent hypokalemia, hypocalcemia and thrombocytopenia were NORMAL   She feels generally well except for profound fatigue .      Outpatient Medications Prior to Visit  Medication Sig Dispense Refill   calcium  citrate (CALCITRATE - DOSED IN MG ELEMENTAL CALCIUM ) 950 MG tablet Take 1 tablet by mouth daily.     cholecalciferol (VITAMIN D ) 25 MCG (1000 UNIT) tablet Take 3 tablets (3,000 Units total) by mouth daily. 90 tablet 1   diclofenac  Sodium (VOLTAREN ) 1 % GEL APPLY 2 GRAMS TOPICALLY FOUR TIMES DAILY 100 g 2   estradiol  (ESTRACE ) 1 MG tablet Take 1 tablet (1 mg total) by mouth daily. 90 tablet 3   fludrocortisone  (FLORINEF ) 0.1 MG tablet TAKE ONE TABLET BY MOUTH DAILY 90 tablet 3   hydrocortisone  (CORTEF ) 20 MG tablet TAKE ONE TABLET BY MOUTH EVERY MORNING AND TAKE ONE-HALF TABLET BY MOUTH  EVERY NIGHT AS DIRECTED 135 tablet 2   Multiple Vitamins-Minerals (MULTIVITAMIN WITH MINERALS) tablet Take 1 tablet by mouth daily.     ondansetron  (ZOFRAN ) 4 MG tablet Take 1 tablet (4 mg total) by mouth every 8 (eight) hours as needed for nausea or vomiting. 20 tablet 0   promethazine  (PHENERGAN ) 12.5 MG tablet Take 1 tablet (12.5 mg total) by mouth every 8 (eight) hours as needed for nausea or vomiting. 20 tablet 0   rosuvastatin  (CRESTOR ) 5 MG tablet Take 1 tablet (5 mg total) by mouth daily. 90 tablet 3   vitamin C (ASCORBIC ACID) 500 MG tablet Take 500 mg by mouth daily.     ALPRAZolam  (XANAX ) 1 MG tablet TAKE 1 TABLET BY MOUTH AT BEDTIME AS NEEDED FOR ANXIETY 30 tablet 5   mirtazapine  (REMERON ) 7.5 MG tablet TAKE ONE TABLET BY MOUTH AT BEDTIME 90 tablet 1   omeprazole  (PRILOSEC) 40 MG capsule TAKE 1 CAPSULE BY MOUTH TWICE DAILY TAKETAKE 1 CAPSULE BY MOUTH ONCE DAILY 180 capsule 1   aspirin  81 MG tablet Take 81 mg by mouth once a week.      ipratropium (ATROVENT ) 0.06 % nasal spray USE 2 SPRAYS IN BOTH NOSTRILS FOUR TIMESDAILY AS DIRECTED. 15 mL 2   No facility-administered medications prior to visit.    Review of Systems;  Patient denies headache, fevers, malaise, unintentional weight loss, skin rash, eye pain, sinus congestion and sinus pain, sore throat, dysphagia,  hemoptysis , cough,  dyspnea, wheezing, chest pain, palpitations, orthopnea, edema, abdominal pain, nausea, melena, diarrhea, constipation, flank pain, dysuria, hematuria, urinary  Frequency, nocturia, numbness, tingling, seizures,  Focal weakness, Loss of consciousness,  Tremor, insomnia, depression, anxiety, and suicidal ideation.      Objective:  BP 100/60   Pulse 81   Temp 98.2 F (36.8 C) (Oral)   Ht 5' 4 (1.626 m)   Wt 121 lb 12.8 oz (55.2 kg)   SpO2 96%   BMI 20.91 kg/m   BP Readings from Last 3 Encounters:  07/28/24 100/60  07/12/24 105/65  01/18/24 102/72    Wt Readings from Last 3 Encounters:   07/28/24 121 lb 12.8 oz (55.2 kg)  07/12/24 129 lb 10.1 oz (58.8 kg)  01/18/24 119 lb 3.2 oz (54.1 kg)    Physical Exam Vitals reviewed.  Constitutional:      General: She is not in acute distress.    Appearance: Normal appearance. She is normal weight. She is not ill-appearing, toxic-appearing or diaphoretic.  HENT:     Head: Normocephalic.  Eyes:     General: No scleral icterus.       Right eye: No discharge.        Left eye: No discharge.     Conjunctiva/sclera: Conjunctivae normal.  Cardiovascular:     Rate and Rhythm: Normal rate and regular rhythm.     Heart sounds: Normal heart sounds.  Pulmonary:     Effort: Pulmonary effort is normal. No respiratory distress.     Breath sounds: Normal breath sounds.  Musculoskeletal:        General: Normal range of motion.  Skin:    General: Skin is warm and dry.  Neurological:     General: No focal deficit present.     Mental Status: She is alert and oriented to person, place, and time. Mental status is at baseline.  Psychiatric:        Mood and Affect: Mood normal.        Behavior: Behavior normal.        Thought Content: Thought content normal.        Judgment: Judgment normal.     Lab Results  Component Value Date   HGBA1C 6.2 01/18/2024   HGBA1C 6.0 07/22/2023   HGBA1C 6.1 01/30/2023    Lab Results  Component Value Date   CREATININE 0.96 07/18/2024   CREATININE 0.65 07/12/2024   CREATININE 0.71 07/11/2024    Lab Results  Component Value Date   WBC 10.6 07/18/2024   HGB 12.9 07/18/2024   HCT 41.4 07/18/2024   PLT 450 (H) 07/18/2024   GLUCOSE 99 07/18/2024   CHOL 161 07/18/2024   TRIG 99 07/18/2024   HDL 75 07/18/2024   LDLDIRECT 66.0 01/18/2024   LDLCALC 68 07/18/2024   ALT 56 (H) 07/18/2024   AST 22 07/18/2024   NA 136 07/18/2024   K 4.9 07/18/2024   CL 100 07/18/2024   CREATININE 0.96 07/18/2024   BUN 17 07/18/2024   CO2 25 07/18/2024   TSH 1.16 07/18/2024   INR 0.9 07/10/2024   HGBA1C 6.2  01/18/2024    CT Angio Chest PE W/Cm &/Or Wo Cm Result Date: 07/10/2024 EXAM: CTA CHEST PE WITHOUT AND WITH CONTRAST CT ABDOMEN AND PELVIS WITHOUT AND WITH CONTRAST 07/10/2024 02:37:15 AM TECHNIQUE: CTA of the chest was performed after the administration of intravenous contrast. Multiplanar reformatted images are provided for review. MIP images are provided for review. CT of the abdomen and pelvis was  performed with the administration of intravenous contrast. Automated exposure control, iterative reconstruction, and/or weight based adjustment of the mA/kV was utilized to reduce the radiation dose to as low as reasonably achievable. COMPARISON: CT abdomen and pelvis dated 08/28/2023. Partial comparison to cardiac CT dated 10/09/2022. CLINICAL HISTORY: Pulmonary embolism (PE) suspected, high prob. Per ed notes; BIB ems from home for CP, nausea and vomiting. Sudden onset started around 2000 tonight. Just flew back from [place] about 24hrs pta. Was given 2 nitroglycerin , 4mg  zofran  pta. FINDINGS: CHEST: PULMONARY ARTERIES: Pulmonary arteries are adequately opacified for evaluation. No evidence of pulmonary embolism. MEDIASTINUM: No mediastinal lymphadenopathy. The heart and pericardium demonstrate no acute abnormality. Although not tailored for evaluation of the thoracic aorta, there is no evidence of thoracic aortic aneurysm or dissection. Mild thoracic aortic atherosclerosis. LUNGS AND PLEURA: 5 mm triangular subpleural nodule in the right middle lobe ( image 93 ), unchanged, benign. No follow-up is recommended per [facility] Society guidelines. Mild dependent atelectasis in the posterior right upper lobe and bilateral lower lobes. No focal consolidation or pulmonary edema. No pleural effusion or pneumothorax. SOFT TISSUES AND BONES: Stable fluid density lesion along the right lateral aspect of the T10 vertebral body (image 108), favoring a benign perineural cyst. Mild degenerative changes of the lower thoracic  spine. ABDOMEN AND PELVIS: LIVER: Mild periportal edema. GALLBLADDER AND BILE DUCTS: Mild layering gallbladder sludge (image 34), without associated inflammatory changes. SPLEEN: Spleen demonstrates no acute abnormality. PANCREAS: Pancreas demonstrates no acute abnormality. ADRENAL GLANDS: Adrenal glands demonstrate no acute abnormality. KIDNEYS, URETERS AND BLADDER: Stable 3 mm nonobstructing left renal calculus ( image 29 ). No hydronephrosis. No perinephric or periureteral stranding. Urinary bladder is unremarkable. GI AND BOWEL: Small hiatal hernia. Stomach and duodenal sweep demonstrate no acute abnormality. There is no bowel obstruction. No abnormal bowel wall thickening or distension. Scattered sigmoid diverticulosis, without evidence of diverticulitis. REPRODUCTIVE: Status post hysterectomy. PERITONEUM AND RETROPERITONEUM: No ascites or free air. LYMPH NODES: No lymphadenopathy. BONES AND SOFT TISSUES: No acute abnormality of the visualized bones. No focal soft tissue abnormality. IMPRESSION: 1. No evidence of pulmonary embolism. 2. No acute findings in the chest, abdomen, or pelvis. 3. Additional stable ancillary findings as above. Electronically signed by: Pinkie Pebbles MD 07/10/2024 02:50 AM EDT RP Workstation: [id number]   CT ABDOMEN PELVIS W CONTRAST Result Date: 07/10/2024 EXAM: CTA CHEST PE WITHOUT AND WITH CONTRAST CT ABDOMEN AND PELVIS WITHOUT AND WITH CONTRAST 07/10/2024 02:37:15 AM TECHNIQUE: CTA of the chest was performed after the administration of intravenous contrast. Multiplanar reformatted images are provided for review. MIP images are provided for review. CT of the abdomen and pelvis was performed with the administration of intravenous contrast. Automated exposure control, iterative reconstruction, and/or weight based adjustment of the mA/kV was utilized to reduce the radiation dose to as low as reasonably achievable. COMPARISON: CT abdomen and pelvis dated 08/28/2023. Partial  comparison to cardiac CT dated 10/09/2022. CLINICAL HISTORY: Pulmonary embolism (PE) suspected, high prob. Per ed notes; BIB ems from home for CP, nausea and vomiting. Sudden onset started around 2000 tonight. Just flew back from [place] about 24hrs pta. Was given 2 nitroglycerin , 4mg  zofran  pta. FINDINGS: CHEST: PULMONARY ARTERIES: Pulmonary arteries are adequately opacified for evaluation. No evidence of pulmonary embolism. MEDIASTINUM: No mediastinal lymphadenopathy. The heart and pericardium demonstrate no acute abnormality. Although not tailored for evaluation of the thoracic aorta, there is no evidence of thoracic aortic aneurysm or dissection. Mild thoracic aortic atherosclerosis. LUNGS AND PLEURA: 5 mm triangular subpleural  nodule in the right middle lobe ( image 93 ), unchanged, benign. No follow-up is recommended per [facility] Society guidelines. Mild dependent atelectasis in the posterior right upper lobe and bilateral lower lobes. No focal consolidation or pulmonary edema. No pleural effusion or pneumothorax. SOFT TISSUES AND BONES: Stable fluid density lesion along the right lateral aspect of the T10 vertebral body (image 108), favoring a benign perineural cyst. Mild degenerative changes of the lower thoracic spine. ABDOMEN AND PELVIS: LIVER: Mild periportal edema. GALLBLADDER AND BILE DUCTS: Mild layering gallbladder sludge (image 34), without associated inflammatory changes. SPLEEN: Spleen demonstrates no acute abnormality. PANCREAS: Pancreas demonstrates no acute abnormality. ADRENAL GLANDS: Adrenal glands demonstrate no acute abnormality. KIDNEYS, URETERS AND BLADDER: Stable 3 mm nonobstructing left renal calculus ( image 29 ). No hydronephrosis. No perinephric or periureteral stranding. Urinary bladder is unremarkable. GI AND BOWEL: Small hiatal hernia. Stomach and duodenal sweep demonstrate no acute abnormality. There is no bowel obstruction. No abnormal bowel wall thickening or distension.  Scattered sigmoid diverticulosis, without evidence of diverticulitis. REPRODUCTIVE: Status post hysterectomy. PERITONEUM AND RETROPERITONEUM: No ascites or free air. LYMPH NODES: No lymphadenopathy. BONES AND SOFT TISSUES: No acute abnormality of the visualized bones. No focal soft tissue abnormality. IMPRESSION: 1. No evidence of pulmonary embolism. 2. No acute findings in the chest, abdomen, or pelvis. 3. Additional stable ancillary findings as above. Electronically signed by: Pinkie Pebbles MD 07/10/2024 02:50 AM EDT RP Workstation: [id number]   DG Chest Port 1 View Result Date: 07/10/2024 EXAM: 1 VIEW XRAY OF THE CHEST 07/10/2024 12:46:00 AM COMPARISON: 07/27/2023 CLINICAL HISTORY: Questionable sepsis - evaluate for abnormality. IB ems from home for CP, nausea and vomiting. Sudden onset started around 2000 tonight. Just flew back from [place] about 24hrs pta. Was given 2 nitroglycerin , 4mg  zofran  pta. FINDINGS: LUNGS AND PLEURA: No focal pulmonary opacity. No pulmonary edema. No pleural effusion. No pneumothorax. HEART AND MEDIASTINUM: No acute abnormality of the cardiac and mediastinal silhouettes. BONES AND SOFT TISSUES: No acute osseous abnormality. IMPRESSION: 1. No acute process. Electronically signed by: Pinkie Pebbles MD 07/10/2024 12:50 AM EDT RP Workstation: [id number]    Assessment & Plan:  .Vitamin B2 deficiency -     Cyanocobalamin   GERD with stricture -     Omeprazole ; TAKE 1 CAPSULE BY MOUTH TWICE DAILY TAKETAKE 1 CAPSULE BY MOUTH ONCE DAILY  Dispense: 180 capsule; Refill: 1  STEC (Shiga toxin-producing Escherichia coli) infection Assessment & Plan: Etiology unclear.  Patient has been vacationing in [place]. Symptoms have resolved.    Petechial rash Assessment & Plan: Secondary to viral illness, platelets are normal and she denies mucosal bleeding    Hypophosphatemia Assessment & Plan: Resolved with repeat testing    Fatigue, unspecified type Assessment & Plan: Post  infectious,  with all electrolyte abnormalities resolved,.  Encouraged resuming light exercise daily , starting with 15 minutes daily of walking.  B12 injection given/      Other orders -     Mirtazapine ; Take 1 tablet (7.5 mg total) by mouth at bedtime.  Dispense: 90 tablet; Refill: 1 -     ALPRAZolam ; TAKE 1 TABLET BY MOUTH AT BEDTIME AS NEEDED FOR ANXIETY  Dispense: 30 tablet; Refill: 5    I personally spent a total of 34   minutes in the care of the patient today including preparing to see the patient, getting/reviewing separately obtained history, performing a medically appropriate exam/evaluation, counseling and educating, and documenting clinical information in the EHR.  Follow-up: Return in about 6 months (  around 01/28/2025).   Verneita LITTIE Kettering, MD

## 2024-07-28 NOTE — Patient Instructions (Signed)
 You received  a B12 injection today  for an energy boost.  If it helps,  we can do one weekly for a  few weeks  Make yourself do some form of exercise daily, even if it's 15 minutes  of walking to start,  and gradually increase your time as tolerated

## 2024-07-31 ENCOUNTER — Encounter: Payer: Self-pay | Admitting: Internal Medicine

## 2024-07-31 NOTE — Assessment & Plan Note (Signed)
 Etiology unclear.  Patient has been vacationing in [place]. Symptoms have resolved.

## 2024-07-31 NOTE — Assessment & Plan Note (Addendum)
 Post infectious,  with all electrolyte abnormalities resolved,.  Encouraged resuming light exercise daily , starting with 15 minutes daily of walking.  B12 injection given/

## 2024-07-31 NOTE — Assessment & Plan Note (Signed)
 Secondary to viral illness, platelets are normal and she denies mucosal bleeding

## 2024-07-31 NOTE — Assessment & Plan Note (Signed)
 Resolved with repeat testing

## 2024-08-17 ENCOUNTER — Encounter: Payer: Self-pay | Admitting: *Deleted

## 2024-08-23 ENCOUNTER — Ambulatory Visit (INDEPENDENT_AMBULATORY_CARE_PROVIDER_SITE_OTHER): Payer: Medicare PPO | Admitting: *Deleted

## 2024-08-23 VITALS — Ht 64.0 in | Wt 121.0 lb

## 2024-08-23 DIAGNOSIS — Z0000 Encounter for general adult medical examination without abnormal findings: Secondary | ICD-10-CM

## 2024-08-23 NOTE — Patient Instructions (Signed)
 Ms. Masser , Thank you for taking time out of your busy schedule to complete your Annual Wellness Visit with me. I enjoyed our conversation and look forward to speaking with you again next year. I, as well as your care team,  appreciate your ongoing commitment to your health goals. Please review the following plan we discussed and let me know if I can assist you in the future. Your Game plan/ To Do List    Referrals: If you haven't heard from the office you've been referred to, please reach out to them at the phone provided.  Remember to update your flu vaccine as discussed.  Follow up Visits: We will see or speak with you next year for your Next Medicare AWV with our clinical staff 08/30/25 @ 10:10 Have you seen your provider in the last 6 months (3 months if uncontrolled diabetes)? Yes  Clinician Recommendations:  Aim for 30 minutes of exercise or brisk walking, 6-8 glasses of water, and 5 servings of fruits and vegetables each day.       This is a list of the screenings recommended for you:  Health Maintenance  Topic Date Due   Colon Cancer Screening  07/13/2024   COVID-19 Vaccine (4 - 2025-26 season) 08/22/2024   Flu Shot  03/21/2025*   Mammogram  02/23/2025   Medicare Annual Wellness Visit  08/23/2025   DTaP/Tdap/Td vaccine (4 - Td or Tdap) 03/17/2032   Pneumococcal Vaccine for age over 42  Completed   DEXA scan (bone density measurement)  Completed   Hepatitis C Screening  Completed   Zoster (Shingles) Vaccine  Completed   HPV Vaccine  Aged Out   Meningitis B Vaccine  Aged Out  *Topic was postponed. The date shown is not the original due date.    Advanced directives: (Copy Requested) Please bring a copy of your health care power of attorney and living will to the office to be added to your chart at your convenience. You can mail to [facility] [address] [place], [place] [address] or email to [email or web address] Advance Care Planning is important  because it:  [x]  Makes sure you receive the medical care that is consistent with your values, goals, and preferences  [x]  It provides guidance to your family and loved ones and reduces their decisional burden about whether or not they are making the right decisions based on your wishes.

## 2024-08-23 NOTE — Progress Notes (Signed)
 Subjective:   Veronica Ferrell is a 73 y.o. who presents for a Medicare Wellness preventive visit.  As a reminder, Annual Wellness Visits don't include a physical exam, and some assessments may be limited, especially if this visit is performed virtually. We may recommend an in-person follow-up visit with your provider if needed.  Visit Complete: Virtual I connected with  Veronica Ferrell on 08/23/24 by a audio enabled telemedicine application and verified that I am speaking with the correct person using two identifiers.  Patient Location: Home  Provider Location: Home Office  I discussed the limitations of evaluation and management by telemedicine. The patient expressed understanding and agreed to proceed.  Vital Signs: Because this visit was a virtual/telehealth visit, some criteria may be missing or patient reported. Any vitals not documented were not able to be obtained and vitals that have been documented are patient reported.  VideoDeclined- This patient declined Librarian, academic. Therefore the visit was completed with audio only.  Persons Participating in Visit: Patient.  AWV Questionnaire: No: Patient Medicare AWV questionnaire was not completed prior to this visit.  Cardiac Risk Factors include: advanced age (>23men, >23 women);dyslipidemia     Objective:    Today's Vitals   08/23/24 1542  Weight: 121 lb (54.9 kg)  Height: 5' 4 (1.626 m)   Body mass index is 20.77 kg/m.     08/23/2024    3:51 PM 07/11/2024    1:00 AM 07/10/2024    6:00 AM 07/10/2024    2:10 AM 08/19/2023    3:04 PM 07/30/2022    3:09 PM 07/11/2021    1:04 PM  Advanced Directives  Does Patient Have a Medical Advance Directive? Yes No No No Yes Yes Yes  Type of Estate agent of [place];Living will    Healthcare Power of [place];Living will Healthcare Power of [place];Living will Healthcare Power of [place];Living will  Does patient want to make  changes to medical advance directive?      No - Patient declined No - Patient declined  Copy of Healthcare Power of Attorney in Chart? No - copy requested    No - copy requested No - copy requested No - copy requested  Would patient like information on creating a medical advance directive?  No - Patient declined No - Patient declined        Current Medications (verified) Outpatient Encounter Medications as of 08/23/2024  Medication Sig   ALPRAZolam  (XANAX ) 1 MG tablet TAKE 1 TABLET BY MOUTH AT BEDTIME AS NEEDED FOR ANXIETY   calcium  citrate (CALCITRATE - DOSED IN MG ELEMENTAL CALCIUM ) 950 MG tablet Take 1 tablet by mouth daily.   cholecalciferol (VITAMIN D ) 25 MCG (1000 UNIT) tablet Take 3 tablets (3,000 Units total) by mouth daily.   diclofenac  Sodium (VOLTAREN ) 1 % GEL APPLY 2 GRAMS TOPICALLY FOUR TIMES DAILY   estradiol  (ESTRACE ) 1 MG tablet Take 1 tablet (1 mg total) by mouth daily.   fludrocortisone  (FLORINEF ) 0.1 MG tablet TAKE ONE TABLET BY MOUTH DAILY   hydrocortisone  (CORTEF ) 20 MG tablet TAKE ONE TABLET BY MOUTH EVERY MORNING AND TAKE ONE-HALF TABLET BY MOUTH EVERY NIGHT AS DIRECTED   mirtazapine  (REMERON ) 7.5 MG tablet Take 1 tablet (7.5 mg total) by mouth at bedtime.   Multiple Vitamins-Minerals (MULTIVITAMIN WITH MINERALS) tablet Take 1 tablet by mouth daily.   omeprazole  (PRILOSEC) 40 MG capsule TAKE 1 CAPSULE BY MOUTH TWICE DAILY TAKETAKE 1 CAPSULE BY MOUTH ONCE DAILY   ondansetron  (  ZOFRAN ) 4 MG tablet Take 1 tablet (4 mg total) by mouth every 8 (eight) hours as needed for nausea or vomiting.   Probiotic Product (PROBIOTIC DAILY PO) Take by mouth daily.   promethazine  (PHENERGAN ) 12.5 MG tablet Take 1 tablet (12.5 mg total) by mouth every 8 (eight) hours as needed for nausea or vomiting.   rosuvastatin  (CRESTOR ) 5 MG tablet Take 1 tablet (5 mg total) by mouth daily.   vitamin C (ASCORBIC ACID) 500 MG tablet Take 500 mg by mouth daily.   No facility-administered encounter  medications on file as of 08/23/2024.    Allergies (verified) Alendronate sodium, Boniva [ibandronate], Clindamycin/lincomycin, Tramadol, and Penicillins   History: Past Medical History:  Diagnosis Date   Addison's disease (HCC)    Allergy    Arthritis    fingers   Cancer (HCC)    skin   Chicken pox    Colon polyps    Concussion 12/26/2020   Diverticulitis    GERD (gastroesophageal reflux disease)    Head injury 10/05/2023   Norovirus 07/11/2024   Septic shock (HCC) 07/11/2024   Past Surgical History:  Procedure Laterality Date   ABDOMINAL HYSTERECTOMY     APPENDECTOMY     BREAST EXCISIONAL BIOPSY Right    2010   BREAST SURGERY     COLONOSCOPY WITH PROPOFOL  N/A 07/14/2019   Procedure: COLONOSCOPY WITH PROPOFOL ;  Surgeon: Jinny Carmine, MD;  Location: [facility] ENDOSCOPY;  Service: Endoscopy;  Laterality: N/A;   ESOPHAGOGASTRODUODENOSCOPY (EGD) WITH PROPOFOL  N/A 04/10/2017   Procedure: ESOPHAGOGASTRODUODENOSCOPY (EGD) WITH PROPOFOL ;  Surgeon: Carmine Jinny, MD;  Location: [facility];  Service: Endoscopy;  Laterality: N/A;  requeests early as possible   FOOT SURGERY Bilateral    TUBAL LIGATION     varicose veins     Family History  Problem Relation Age of Onset   Cancer Mother 40       Breast Ca  colon Ca (58)  and brain Ca (23)   Breast cancer Mother 69   Cancer Father    Cancer Maternal Aunt        breast   Breast cancer Maternal Aunt    Cancer Maternal Grandmother        breast   Breast cancer Maternal Grandmother    Cancer Maternal Aunt        breast ca   Breast cancer Maternal Aunt    Cancer Maternal Aunt        Breast   Social History   Socioeconomic History   Marital status: Married    Spouse name: Not on file   Number of children: Not on file   Years of education: Not on file   Highest education level: Master's degree (e.g., MA, MS, MEng, MEd, MSW, MBA)  Occupational History   Not on file  Tobacco Use   Smoking status: Never   Smokeless  tobacco: Never  Vaping Use   Vaping status: Never Used  Substance and Sexual Activity   Alcohol use: Yes    Alcohol/week: 7.0 standard drinks of alcohol    Types: 7 Glasses of wine per week    Comment: moderate, social   Drug use: No   Sexual activity: Yes    Birth control/protection: Post-menopausal, Surgical  Other Topics Concern   Not on file  Social History Narrative   Not on file   Social Drivers of Health   Financial Resource Strain: Low Risk  (08/23/2024)   Overall Financial Resource Strain (CARDIA)  Difficulty of Paying Living Expenses: Not hard at all  Food Insecurity: No Food Insecurity (08/23/2024)   Hunger Vital Sign    Worried About Running Out of Food in the Last Year: Never true    Ran Out of Food in the Last Year: Never true  Transportation Needs: No Transportation Needs (08/23/2024)   PRAPARE - Administrator, Civil Service (Medical): No    Lack of Transportation (Non-Medical): No  Physical Activity: Sufficiently Active (08/23/2024)   Exercise Vital Sign    Days of Exercise per Week: 6 days    Minutes of Exercise per Session: 60 min  Stress: No Stress Concern Present (08/23/2024)   [employer] of Occupational Health - Occupational Stress Questionnaire    Feeling of Stress: Not at all  Social Connections: Socially Integrated (08/23/2024)   Social Connection and Isolation Panel    Frequency of Communication with Friends and Family: More than three times a week    Frequency of Social Gatherings with Friends and Family: More than three times a week    Attends Religious Services: More than 4 times per year    Active Member of [employer] or Organizations: Yes    Attends Engineer, structural: More than 4 times per year    Marital Status: Married    Tobacco Counseling Counseling given: Not Answered    Clinical Intake:  Pre-visit preparation completed: Yes  Pain : No/denies pain     BMI - recorded: 20.77 Nutritional Status: BMI of 19-24   Normal Nutritional Risks: None Diabetes: No  Lab Results  Component Value Date   HGBA1C 6.2 01/18/2024   HGBA1C 6.0 07/22/2023   HGBA1C 6.1 01/30/2023     How often do you need to have someone help you when you read instructions, pamphlets, or other written materials from your doctor or pharmacy?: 1 - Never  Interpreter Needed?: No  Information entered by :: R. Krysia Zahradnik LPN   Activities of Daily Living     08/23/2024    3:43 PM 07/11/2024    1:00 AM  In your present state of health, do you have any difficulty performing the following activities:  Hearing? 0 0  Vision? 0 0  Difficulty concentrating or making decisions? 0 0  Walking or climbing stairs? 0   Dressing or bathing? 0   Doing errands, shopping? 0 0  Preparing Food and eating ? N   Using the Toilet? N   In the past six months, have you accidently leaked urine? N   Do you have problems with loss of bowel control? N   Managing your Medications? N   Managing your Finances? N   Housekeeping or managing your Housekeeping? N     Patient Care Team: Marylynn Verneita CROME, MD as PCP - General (Internal Medicine) Jinny Carmine, MD as Consulting Physician (Gastroenterology)  I have updated your Care Teams any recent Medical Services you may have received from other providers in the past year.     Assessment:   This is a routine wellness examination for [facility].  Hearing/Vision screen Hearing Screening - Comments:: No issues Vision Screening - Comments:: readers   Goals Addressed             This Visit's Progress    Patient Stated       Wants to continue to exercise       Depression Screen     08/23/2024    3:47 PM 07/28/2024    9:29 AM 07/13/2024  2:31 PM 01/18/2024    3:31 PM 10/28/2023    5:03 PM 09/22/2023   11:46 AM 08/19/2023    2:59 PM  PHQ 2/9 Scores  PHQ - 2 Score 0 0 0 0 0 0 0  PHQ- 9 Score 0    0 0 0    Fall Risk     08/23/2024    3:44 PM 07/28/2024    9:29 AM 07/13/2024    2:19 PM 01/18/2024    3:31 PM  10/28/2023    5:03 PM  Fall Risk   Falls in the past year? 0 0 0 0 1  Number falls in past yr: 0 0 0 0 0  Injury with Fall? 0 0 0 0 1  Risk for fall due to : No Fall Risks No Fall Risks  No Fall Risks History of fall(s)  Follow up Falls evaluation completed;Falls prevention discussed Falls evaluation completed  Falls evaluation completed Falls evaluation completed    MEDICARE RISK AT HOME:  Medicare Risk at Home Any stairs in or around the home?: Yes If so, are there any without handrails?: No Home free of loose throw rugs in walkways, pet beds, electrical cords, etc?: Yes Adequate lighting in your home to reduce risk of falls?: Yes Life alert?: No Use of a cane, walker or w/c?: No Grab bars in the bathroom?: No Shower chair or bench in shower?: Yes Elevated toilet seat or a handicapped toilet?: Yes  TIMED UP AND GO:  Was the test performed?  No  Cognitive Function: 6CIT completed    07/10/2020    9:41 AM 06/17/2018    3:35 PM  MMSE - Mini Mental State Exam  Not completed: Unable to complete   Orientation to time  5  Orientation to Place  5  Registration  3  Attention/ Calculation  5  Recall  3  Language- name 2 objects  2  Language- repeat  1  Language- follow 3 step command  3  Language- read & follow direction  1  Write a sentence  1  Copy design  1  Total score  30        08/23/2024    3:51 PM 08/19/2023    3:04 PM 06/20/2019   11:27 AM  6CIT Screen  What Year? 0 points 0 points 0 points  What month? 0 points 0 points 0 points  What time? 0 points 0 points 0 points  Count back from 20 0 points 0 points 0 points  Months in reverse 0 points 0 points 0 points  Repeat phrase 0 points 0 points 0 points  Total Score 0 points 0 points 0 points    Immunizations Immunization History  Administered Date(s) Administered   Fluad Quad(high Dose 65+) 09/16/2019   INFLUENZA, HIGH DOSE SEASONAL PF 10/08/2020, 10/13/2023   Influenza Split 09/21/2013, 09/19/2015,  08/30/2017   Influenza,inj,Quad PF,6+ Mos 09/11/2014   Influenza-Unspecified 08/22/2018, 09/19/2021, 09/19/2022   PFIZER(Purple Top)SARS-COV-2 Vaccination 01/13/2020, 02/03/2020, 09/23/2020   Pneumococcal Conjugate-13 09/11/2014   Pneumococcal Polysaccharide-23 03/08/2012, 03/30/2018   Tdap 03/09/2011, 03/08/2012, 03/17/2022   Zoster Recombinant(Shingrix) 08/24/2018, 11/19/2018   Zoster, Live 03/08/2012    Screening Tests Health Maintenance  Topic Date Due   Colonoscopy  07/13/2024   Medicare Annual Wellness (AWV)  08/18/2024   COVID-19 Vaccine (4 - 2025-26 season) 08/22/2024   INFLUENZA VACCINE  03/21/2025 (Originally 07/22/2024)   MAMMOGRAM  02/23/2025   DTaP/Tdap/Td (4 - Td or Tdap) 03/17/2032   Pneumococcal Vaccine: 50+  Years  Completed   DEXA SCAN  Completed   Hepatitis C Screening  Completed   Zoster Vaccines- Shingrix  Completed   HPV VACCINES  Aged Out   Meningococcal B Vaccine  Aged Out    Health Maintenance  Health Maintenance Due  Topic Date Due   Colonoscopy  07/13/2024   Medicare Annual Wellness (AWV)  08/18/2024   COVID-19 Vaccine (4 - 2025-26 season) 08/22/2024   Health Maintenance Items Addressed: Patient has a colonoscopy scheduled 09/12/24. Patient stated that she will get her flu shot at her pharmacy. Patient declines covid vaccine.  Additional Screening:  Vision Screening: Recommended annual ophthalmology exams for early detection of glaucoma and other disorders of the eye. Up to date  [facility] Would you like a referral to an eye doctor? No    Dental Screening: Recommended annual dental exams for proper oral hygiene  Community Resource Referral / Chronic Care Management: CRR required this visit?  No   CCM required this visit?  No   Plan:    I have personally reviewed and noted the following in the patient's chart:   Medical and social history Use of alcohol, tobacco or illicit drugs  Current medications and supplements including opioid  prescriptions. Patient is not currently taking opioid prescriptions. Functional ability and status Nutritional status Physical activity Advanced directives List of other physicians Hospitalizations, surgeries, and ER visits in previous 12 months Vitals Screenings to include cognitive, depression, and falls Referrals and appointments  In addition, I have reviewed and discussed with patient certain preventive protocols, quality metrics, and best practice recommendations. A written personalized care plan for preventive services as well as general preventive health recommendations were provided to patient.   Angeline Fredericks, LPN   [id number]   After Visit Summary: (MyChart) Due to this being a telephonic visit, the after visit summary with patients personalized plan was offered to patient via MyChart   Notes: Nothing significant to report at this time.

## 2024-09-02 NOTE — Progress Notes (Signed)
 Cardiology Office Note  Date:  09/05/2024   ID:  Ferrell, Veronica Apr 25, 1951, MRN [id number]  PCP:  Marylynn Verneita CROME, MD   Chief Complaint  Patient presents with   12 month follow up     Doing well.     HPI:  [name] is a 73 year old woman with history of  Addison's disease, maintained on cortisone and Florinef  (previous hyponatremia and hyperkalemia) chronic insomnia, Previously seen by cardiology in 2015 for transient amnesia. Aorta atherosclerosis on CT scan 2019 Who presents for follow-up of her exertional fatigue  Last seen by myself in clinic October 2023 In follow-up today reports feeling well  Bout of norovirus after traveling Soon after she got home, had symptoms 7/25 in hospital 3 days Low potassium  Reports she has leg cramps at night Continues on Crestor  5 every night  Walks 5 days a week, works out  Prior imaging reviewed Cardiac CTA 10/23 1. Normal coronary calcium  score of 0. Patient is low risk for coronary events. 2. Normal coronary origin with right dominance. 3. No evidence of CAD.  Blood pressure running low, chronic issue  CT scan abdomen 2019  mild aortic atherosclerosis noted  EKG personally reviewed by myself on todays visit EKG Interpretation Date/Time:  Monday September 05 2024 09:05:39 EDT Ventricular Rate:  75 PR Interval:  146 QRS Duration:  76 QT Interval:  364 QTC Calculation: 406 R Axis:   41  Text Interpretation: Normal sinus rhythm Normal ECG When compared with ECG of 10-Jul-2024 00:00, No significant change was found Confirmed by Perla Lye [phone]) on 09/05/2024 9:17:29 AM    Lab work reviewed Total cholesterol 161, LDL 68   PMH:   has a past medical history of Addison's disease (HCC), Allergy, Arthritis, Cancer (HCC), Chicken pox, Colon polyps, Concussion (12/26/2020), Diverticulitis, GERD (gastroesophageal reflux disease), Head injury (10/05/2023), Norovirus (07/11/2024), and Septic shock (HCC)  (07/11/2024).  PSH:    Past Surgical History:  Procedure Laterality Date   ABDOMINAL HYSTERECTOMY     APPENDECTOMY     BREAST EXCISIONAL BIOPSY Right    2010   BREAST SURGERY     COLONOSCOPY WITH PROPOFOL  N/A 07/14/2019   Procedure: COLONOSCOPY WITH PROPOFOL ;  Surgeon: Jinny Carmine, MD;  Location: [facility] ENDOSCOPY;  Service: Endoscopy;  Laterality: N/A;   ESOPHAGOGASTRODUODENOSCOPY (EGD) WITH PROPOFOL  N/A 04/10/2017   Procedure: ESOPHAGOGASTRODUODENOSCOPY (EGD) WITH PROPOFOL ;  Surgeon: Carmine Jinny, MD;  Location: [facility];  Service: Endoscopy;  Laterality: N/A;  requeests early as possible   FOOT SURGERY Bilateral    TUBAL LIGATION     varicose veins      Current Outpatient Medications  Medication Sig Dispense Refill   ALPRAZolam  (XANAX ) 1 MG tablet TAKE 1 TABLET BY MOUTH AT BEDTIME AS NEEDED FOR ANXIETY 30 tablet 5   calcium  citrate (CALCITRATE - DOSED IN MG ELEMENTAL CALCIUM ) 950 MG tablet Take 1 tablet by mouth daily.     cholecalciferol (VITAMIN D ) 25 MCG (1000 UNIT) tablet Take 3 tablets (3,000 Units total) by mouth daily. 90 tablet 1   diclofenac  Sodium (VOLTAREN ) 1 % GEL APPLY 2 GRAMS TOPICALLY FOUR TIMES DAILY 100 g 2   estradiol  (ESTRACE ) 1 MG tablet Take 1 tablet (1 mg total) by mouth daily. 90 tablet 3   fludrocortisone  (FLORINEF ) 0.1 MG tablet TAKE ONE TABLET BY MOUTH DAILY 90 tablet 3   hydrocortisone  (CORTEF ) 20 MG tablet TAKE ONE TABLET BY MOUTH EVERY MORNING AND TAKE ONE-HALF TABLET BY MOUTH EVERY NIGHT  AS DIRECTED 135 tablet 2   mirtazapine  (REMERON ) 7.5 MG tablet Take 1 tablet (7.5 mg total) by mouth at bedtime. 90 tablet 1   Multiple Vitamins-Minerals (MULTIVITAMIN WITH MINERALS) tablet Take 1 tablet by mouth daily.     omeprazole  (PRILOSEC) 40 MG capsule TAKE 1 CAPSULE BY MOUTH TWICE DAILY TAKETAKE 1 CAPSULE BY MOUTH ONCE DAILY 180 capsule 1   ondansetron  (ZOFRAN ) 4 MG tablet Take 1 tablet (4 mg total) by mouth every 8 (eight) hours as needed for nausea or  vomiting. 20 tablet 0   Probiotic Product (PROBIOTIC DAILY PO) Take by mouth daily.     promethazine  (PHENERGAN ) 12.5 MG tablet Take 1 tablet (12.5 mg total) by mouth every 8 (eight) hours as needed for nausea or vomiting. 20 tablet 0   rosuvastatin  (CRESTOR ) 5 MG tablet Take 1 tablet (5 mg total) by mouth daily. 90 tablet 3   vitamin C (ASCORBIC ACID) 500 MG tablet Take 500 mg by mouth daily.     No current facility-administered medications for this visit.    Allergies:   Alendronate sodium, Boniva [ibandronate], Clindamycin/lincomycin, Tramadol, and Penicillins   Social History:  The patient  reports that she has never smoked. She has never used smokeless tobacco. She reports current alcohol use of about 7.0 standard drinks of alcohol per week. She reports that she does not use drugs.   Family History:   family history includes Breast cancer in her maternal aunt, maternal aunt, and maternal grandmother; Breast cancer (age of onset: 70) in her mother; Cancer in her father, maternal aunt, maternal aunt, maternal aunt, and maternal grandmother; Cancer (age of onset: 76) in her mother.    Review of Systems: Review of Systems  Constitutional: Negative.   HENT: Negative.    Respiratory: Negative.    Cardiovascular: Negative.   Gastrointestinal: Negative.   Musculoskeletal:  Positive for myalgias.  Neurological: Negative.   Psychiatric/Behavioral: Negative.    All other systems reviewed and are negative.   PHYSICAL EXAM: VS:  BP 100/60 (BP Location: Left Arm, Patient Position: Sitting, Cuff Size: Normal)   Ht 5' 4 (1.626 m)   Wt 125 lb 6 oz (56.9 kg)   SpO2 96%   BMI 21.52 kg/m  , BMI Body mass index is 21.52 kg/m. Constitutional:  oriented to person, place, and time. No distress.  HENT:  Head: Grossly normal Eyes:  no discharge. No scleral icterus.  Neck: No JVD, no carotid bruits  Cardiovascular: Regular rate and rhythm, no murmurs appreciated Pulmonary/Chest: Clear to  auscultation bilaterally, no wheezes or rails Abdominal: Soft.  no distension.  no tenderness.  Musculoskeletal: Normal range of motion Neurological:  normal muscle tone. Coordination normal. No atrophy Skin: Skin warm and dry Psychiatric: normal affect, pleasant  Recent Labs: 07/12/2024: Magnesium  2.1 07/18/2024: ALT 56; BUN 17; Creat 0.96; Hemoglobin 12.9; Platelets 450; Potassium 4.9; Sodium 136; TSH 1.16    Lipid Panel Lab Results  Component Value Date   CHOL 161 07/18/2024   HDL 75 07/18/2024   LDLCALC 68 07/18/2024   TRIG 99 07/18/2024      Wt Readings from Last 3 Encounters:  09/05/24 125 lb 6 oz (56.9 kg)  08/23/24 121 lb (54.9 kg)  07/28/24 121 lb 12.8 oz (55.2 kg)      ASSESSMENT AND PLAN:  Problem List Items Addressed This Visit       Cardiology Problems   Hyperlipidemia, unspecified   Abdominal aortic atherosclerosis (HCC) - Primary   Relevant Orders  EKG 12-Lead (Completed)     Other   Fatigue   Relevant Orders   EKG 12-Lead (Completed)   Other Visit Diagnoses       Angina pectoris (HCC)       Relevant Orders   EKG 12-Lead (Completed)     SOB (shortness of breath)       Relevant Orders   EKG 12-Lead (Completed)       Fatigue/shortness of breath Exercising on a regular basis Stable symptoms  Hypotension History of Addison's disease, Stays hydrated, salt loads Periodically needs steroids  Hyperlipidemia Recommend she decrease Crestor  down to every other day given cramping at nighttime If cholesterol runs high could add Zetia 10 daily For persistent cramping on Crestor  may need to stop and try alternate statin  Mild aortic atherosclerosis Seen on CT scan 2019 Lipid management as above    Signed, Velinda Lunger, M.D., Ph.D. [facility] [place], [place] [phone]

## 2024-09-05 ENCOUNTER — Encounter: Payer: Self-pay | Admitting: Cardiovascular Disease

## 2024-09-05 ENCOUNTER — Ambulatory Visit: Attending: Cardiovascular Disease | Admitting: Cardiovascular Disease

## 2024-09-05 VITALS — BP 100/60 | Ht 64.0 in | Wt 125.4 lb

## 2024-09-05 DIAGNOSIS — I700 Atherosclerosis of aorta: Secondary | ICD-10-CM

## 2024-09-05 DIAGNOSIS — E782 Mixed hyperlipidemia: Secondary | ICD-10-CM | POA: Diagnosis not present

## 2024-09-05 DIAGNOSIS — R5383 Other fatigue: Secondary | ICD-10-CM

## 2024-09-05 DIAGNOSIS — R0602 Shortness of breath: Secondary | ICD-10-CM

## 2024-09-05 DIAGNOSIS — I209 Angina pectoris, unspecified: Secondary | ICD-10-CM

## 2024-09-05 MED ORDER — ROSUVASTATIN CALCIUM 5 MG PO TABS: 5.0000 mg | ORAL_TABLET | ORAL | 3 refills | Status: AC

## 2024-09-05 NOTE — Patient Instructions (Addendum)
 Medication Instructions:   Decrease crestor  down to every other day  If you need a refill on your cardiac medications before your next appointment, please call your pharmacy.   Lab work: No new labs needed  Testing/Procedures: No new testing needed  Follow-Up: At [facility], you and your health needs are our priority.  As part of our continuing mission to provide you with exceptional heart care, we have created designated Provider Care Teams.  These Care Teams include your primary Cardiologist (physician) and Advanced Practice Providers (APPs -  Physician Assistants and Nurse Practitioners) who all work together to provide you with the care you need, when you need it.  You will need a follow up appointment in 12 months  Providers on your designated Care Team:   Lonni Meager, NP Bernardino Bring, PA-C Cadence [place], [place]  COVID-19 Vaccine Information can be found at: [email or web address] For questions related to vaccine distribution or appointments, please email [email or web address] or call [phone].

## 2024-09-12 ENCOUNTER — Ambulatory Visit

## 2024-09-12 ENCOUNTER — Encounter: Payer: Self-pay | Admitting: Gastroenterology

## 2024-09-12 ENCOUNTER — Ambulatory Visit
Admission: RE | Admit: 2024-09-12 | Discharge: 2024-09-12 | Disposition: A | Discharge status: Home / Self Care | Attending: Gastroenterology | Admitting: Gastroenterology

## 2024-09-12 ENCOUNTER — Encounter
Admission: RE | Disposition: A | Discharge status: Home / Self Care | Payer: Self-pay | Source: Home / Self Care | Attending: Gastroenterology

## 2024-09-12 DIAGNOSIS — K219 Gastro-esophageal reflux disease without esophagitis: Secondary | ICD-10-CM | POA: Insufficient documentation

## 2024-09-12 DIAGNOSIS — D123 Benign neoplasm of transverse colon: Secondary | ICD-10-CM

## 2024-09-12 DIAGNOSIS — Z1211 Encounter for screening for malignant neoplasm of colon: Secondary | ICD-10-CM

## 2024-09-12 DIAGNOSIS — D122 Benign neoplasm of ascending colon: Secondary | ICD-10-CM | POA: Diagnosis not present

## 2024-09-12 DIAGNOSIS — D120 Benign neoplasm of cecum: Secondary | ICD-10-CM

## 2024-09-12 DIAGNOSIS — Z860101 Personal history of adenomatous and serrated colon polyps: Secondary | ICD-10-CM | POA: Diagnosis not present

## 2024-09-12 DIAGNOSIS — K641 Second degree hemorrhoids: Secondary | ICD-10-CM

## 2024-09-12 DIAGNOSIS — Z860100 Personal history of colon polyps, unspecified: Secondary | ICD-10-CM

## 2024-09-12 DIAGNOSIS — K635 Polyp of colon: Secondary | ICD-10-CM | POA: Diagnosis present

## 2024-09-12 DIAGNOSIS — K5730 Diverticulosis of large intestine without perforation or abscess without bleeding: Secondary | ICD-10-CM | POA: Diagnosis not present

## 2024-09-12 DIAGNOSIS — Z800 Family history of malignant neoplasm of digestive organs: Secondary | ICD-10-CM

## 2024-09-12 DIAGNOSIS — E785 Hyperlipidemia, unspecified: Secondary | ICD-10-CM | POA: Diagnosis not present

## 2024-09-12 HISTORY — PX: POLYPECTOMY: SHX149

## 2024-09-12 HISTORY — PX: COLONOSCOPY: SHX5424

## 2024-09-12 SURGERY — COLONOSCOPY
Anesthesia: General

## 2024-09-12 MED ORDER — SODIUM CHLORIDE 0.9 % IV SOLN
INTRAVENOUS | Status: DC
  Administered 2024-09-12: 20 mL/h via INTRAVENOUS

## 2024-09-12 MED ORDER — LIDOCAINE HCL (CARDIAC) PF 100 MG/5ML IV SOSY
PREFILLED_SYRINGE | INTRAVENOUS | Status: DC | PRN
  Administered 2024-09-12: 40 mg via INTRAVENOUS

## 2024-09-12 MED ORDER — LIDOCAINE HCL (PF) 2 % IJ SOLN
INTRAMUSCULAR | Status: AC
  Filled 2024-09-12: qty 5

## 2024-09-12 MED ORDER — PHENYLEPHRINE HCL (PRESSORS) 10 MG/ML IV SOLN
INTRAVENOUS | Status: DC | PRN
  Administered 2024-09-12 (×2): 80 ug via INTRAVENOUS

## 2024-09-12 MED ORDER — PROPOFOL 10 MG/ML IV BOLUS
INTRAVENOUS | Status: DC | PRN
  Administered 2024-09-12 (×5): 20 mg via INTRAVENOUS
  Administered 2024-09-12: 70 mg via INTRAVENOUS
  Administered 2024-09-12 (×2): 10 mg via INTRAVENOUS

## 2024-09-12 MED ORDER — PHENYLEPHRINE 80 MCG/ML (10ML) SYRINGE FOR IV PUSH (FOR BLOOD PRESSURE SUPPORT)
PREFILLED_SYRINGE | INTRAVENOUS | Status: AC
  Filled 2024-09-12: qty 10

## 2024-09-12 NOTE — Transfer of Care (Signed)
 Immediate Anesthesia Transfer of Care Note  Patient: Veronica Ferrell  Procedure(s) Performed: COLONOSCOPY POLYPECTOMY, INTESTINE  Patient Location: PACU  Anesthesia Type:General  Level of Consciousness: drowsy  Airway & Oxygen Therapy: Patient Spontanous Breathing  Post-op Assessment: Report given to RN and Post -op Vital signs reviewed and stable  Post vital signs: Reviewed and stable  Last Vitals:  Vitals Value Taken Time  BP 87/53 09/12/24 10:35  Temp 35.9 C 09/12/24 10:35  Pulse 67 09/12/24 10:36  Resp 15 09/12/24 10:36  SpO2 99 % 09/12/24 10:36  Vitals shown include unfiled device data.  Last Pain:  Vitals:   09/12/24 1035  TempSrc: Tympanic  PainSc: Asleep         Complications: No notable events documented.

## 2024-09-12 NOTE — Op Note (Signed)
 [facility] Gastroenterology Patient Name: Veronica Ferrell Procedure Date: 09/12/2024 9:52 AM MRN: [id number] Account #: [id number] Date of Birth: 1951-04-03 Admit Type: Outpatient Age: 73 Room: [facility] ENDO ROOM 4 Gender: Female Note Status: Finalized Instrument Name: Colon Scope [id number] Procedure:             Colonoscopy Indications:           High risk colon cancer surveillance: Personal history                         of colonic polyps Providers:             Rogelia Copping MD, MD Referring MD:          Verneita Kettering, MD (Referring MD) Medicines:             Propofol  per Anesthesia Complications:         No immediate complications. Procedure:             Pre-Anesthesia Assessment:                        - Prior to the procedure, a History and Physical was                         performed, and patient medications and allergies were                         reviewed. The patient's tolerance of previous                         anesthesia was also reviewed. The risks and benefits                         of the procedure and the sedation options and risks                         were discussed with the patient. All questions were                         answered, and informed consent was obtained. Prior                         Anticoagulants: The patient has taken no anticoagulant                         or antiplatelet agents. ASA Grade Assessment: II - A                         patient with mild systemic disease. After reviewing                         the risks and benefits, the patient was deemed in                         satisfactory condition to undergo the procedure.                        After obtaining informed consent, the colonoscope was  passed under direct vision. Throughout the procedure,                         the patient's blood pressure, pulse, and oxygen                         saturations were monitored continuously. The                          Colonoscope was introduced through the anus and                         advanced to the the cecum, identified by appendiceal                         orifice and ileocecal valve. The colonoscopy was                         performed without difficulty. The patient tolerated                         the procedure well. The quality of the bowel                         preparation was excellent. Findings:      The perianal and digital rectal examinations were normal.      Three sessile polyps were found in the ascending colon. The polyps were       2 to 4 mm in size. These polyps were removed with a cold snare.       Resection and retrieval were complete.      Three sessile polyps were found in the transverse colon. The polyps were       3 to 4 mm in size. These polyps were removed with a cold snare.       Resection and retrieval were complete.      Multiple small-mouthed diverticula were found in the sigmoid colon,       descending colon and ascending colon.      Non-bleeding internal hemorrhoids were found during retroflexion. The       hemorrhoids were Grade II (internal hemorrhoids that prolapse but reduce       spontaneously).      A 3 mm polyp was found in the cecum. The polyp was sessile. The polyp       was removed with a cold snare. Resection and retrieval were complete. Impression:            - Three 2 to 4 mm polyps in the ascending colon,                         removed with a cold snare. Resected and retrieved.                        - Three 3 to 4 mm polyps in the transverse colon,                         removed with a cold snare. Resected and retrieved.                        -  Diverticulosis in the sigmoid colon, in the                         descending colon and in the ascending colon.                        - Non-bleeding internal hemorrhoids.                        - One 3 mm polyp in the cecum, removed with a cold                         snare. Resected  and retrieved. Recommendation:        - Discharge patient to home.                        - Resume previous diet.                        - Continue present medications.                        - Await pathology results.                        - Repeat colonoscopy in 3 years for surveillance. Procedure Code(s):     --- Professional ---                        [phone], Colonoscopy, flexible; with removal of                         tumor(s), polyp(s), or other lesion(s) by snare                         technique Diagnosis Code(s):     --- Professional ---                        Z86.010, Personal history of colonic polyps                        D12.3, Benign neoplasm of transverse colon (hepatic                         flexure or splenic flexure) CPT copyright 2022 American Medical Association. All rights reserved. The codes documented in this report are preliminary and upon coder review may  be revised to meet current compliance requirements. Rogelia Copping MD, MD 09/12/2024 10:34:52 AM This report has been signed electronically. Number of Addenda: 0 Note Initiated On: 09/12/2024 9:52 AM Scope Withdrawal Time: 0 hours 9 minutes 47 seconds  Total Procedure Duration: 0 hours 25 minutes 1 second  Estimated Blood Loss:  Estimated blood loss: none.      [facility]

## 2024-09-12 NOTE — H&P (Signed)
 Veronica Copping, MD [facility] [address] [place], [place] [address] [phone] Fax : [phone]  Primary Care Physician:  Marylynn Verneita CROME, MD Primary Gastroenterologist:  Dr. Copping  Pre-Procedure History & Physical: HPI:  Veronica Ferrell is a 73 y.o. female is here for an colonoscopy.   Past Medical History:  Diagnosis Date   Addison's disease (HCC)    Allergy    Arthritis    fingers   Cancer (HCC)    skin   Chicken pox    Colon polyps    Concussion 12/26/2020   Diverticulitis    GERD (gastroesophageal reflux disease)    Head injury 10/05/2023   Norovirus 07/11/2024   Septic shock (HCC) 07/11/2024    Past Surgical History:  Procedure Laterality Date   ABDOMINAL HYSTERECTOMY     APPENDECTOMY     BREAST EXCISIONAL BIOPSY Right    2010   BREAST SURGERY     COLONOSCOPY WITH PROPOFOL  N/A 07/14/2019   Procedure: COLONOSCOPY WITH PROPOFOL ;  Surgeon: Ferrell Rogelia, MD;  Location: [facility] ENDOSCOPY;  Service: Endoscopy;  Laterality: N/A;   ESOPHAGOGASTRODUODENOSCOPY (EGD) WITH PROPOFOL  N/A 04/10/2017   Procedure: ESOPHAGOGASTRODUODENOSCOPY (EGD) WITH PROPOFOL ;  Surgeon: Veronica Copping, MD;  Location: [facility];  Service: Endoscopy;  Laterality: N/A;  requeests early as possible   FOOT SURGERY Bilateral    TUBAL LIGATION     varicose veins      Prior to Admission medications   Medication Sig Start Date End Date Taking? Authorizing Provider  ALPRAZolam  (XANAX ) 1 MG tablet TAKE 1 TABLET BY MOUTH AT BEDTIME AS NEEDED FOR ANXIETY 07/28/24  Yes Marylynn Verneita CROME, MD  calcium  citrate (CALCITRATE - DOSED IN MG ELEMENTAL CALCIUM ) 950 MG tablet Take 1 tablet by mouth daily.   Yes [provider]  cholecalciferol (VITAMIN D ) 25 MCG (1000 UNIT) tablet Take 3 tablets (3,000 Units total) by mouth daily. 03/02/21  Yes Marylynn Verneita CROME, MD  diclofenac  Sodium (VOLTAREN ) 1 % GEL APPLY 2 GRAMS TOPICALLY FOUR TIMES DAILY 08/08/21  Yes Marylynn Verneita CROME, MD  estradiol   (ESTRACE ) 1 MG tablet Take 1 tablet (1 mg total) by mouth daily. 01/18/24  Yes Marylynn Verneita CROME, MD  fludrocortisone  (FLORINEF ) 0.1 MG tablet TAKE ONE TABLET BY MOUTH DAILY 02/15/24  Yes Tullo, Teresa L, MD  hydrocortisone  (CORTEF ) 20 MG tablet TAKE ONE TABLET BY MOUTH EVERY MORNING AND TAKE ONE-HALF TABLET BY MOUTH EVERY NIGHT AS DIRECTED 03/21/24  Yes Marylynn Verneita CROME, MD  mirtazapine  (REMERON ) 7.5 MG tablet Take 1 tablet (7.5 mg total) by mouth at bedtime. 07/28/24  Yes Tullo, Teresa L, MD  Multiple Vitamins-Minerals (MULTIVITAMIN WITH MINERALS) tablet Take 1 tablet by mouth daily.   Yes [provider]  omeprazole  (PRILOSEC) 40 MG capsule TAKE 1 CAPSULE BY MOUTH TWICE DAILY TAKETAKE 1 CAPSULE BY MOUTH ONCE DAILY 07/28/24  Yes Tullo, Teresa L, MD  ondansetron  (ZOFRAN ) 4 MG tablet Take 1 tablet (4 mg total) by mouth every 8 (eight) hours as needed for nausea or vomiting. 07/12/24  Yes Josette Ade, MD  Probiotic Product (PROBIOTIC DAILY PO) Take by mouth daily.   Yes [provider]  promethazine  (PHENERGAN ) 12.5 MG tablet Take 1 tablet (12.5 mg total) by mouth every 8 (eight) hours as needed for nausea or vomiting. 01/18/24  Yes Marylynn Verneita CROME, MD  rosuvastatin  (CRESTOR ) 5 MG tablet Take 1 tablet (5 mg total) by mouth every other day. 09/05/24  Yes Gollan, Timothy J, MD  vitamin  C (ASCORBIC ACID) 500 MG tablet Take 500 mg by mouth daily.   Yes [provider]    Allergies as of 05/23/2024 - Review Complete 01/18/2024  Allergen Reaction Noted   Alendronate sodium Nausea Only 09/06/2015   Boniva [ibandronate] Nausea And Vomiting 07/06/2013   Clindamycin/lincomycin Dermatitis 07/06/2013   Tramadol  11/04/2017   Penicillins Rash 07/06/2013    Family History  Problem Relation Age of Onset   Cancer Mother 5       Breast Ca  colon Ca (58)  and brain Ca (69)   Breast cancer Mother 43   Cancer Father    Cancer Maternal Aunt        breast   Breast cancer Maternal Aunt     Cancer Maternal Grandmother        breast   Breast cancer Maternal Grandmother    Cancer Maternal Aunt        breast ca   Breast cancer Maternal Aunt    Cancer Maternal Aunt        Breast    Social History   Socioeconomic History   Marital status: Married    Spouse name: Not on file   Number of children: Not on file   Years of education: Not on file   Highest education level: Master's degree (e.g., MA, MS, MEng, MEd, MSW, MBA)  Occupational History   Not on file  Tobacco Use   Smoking status: Never   Smokeless tobacco: Never  Vaping Use   Vaping status: Never Used  Substance and Sexual Activity   Alcohol use: Yes    Alcohol/week: 7.0 standard drinks of alcohol    Types: 7 Glasses of wine per week    Comment: moderate, social   Drug use: No   Sexual activity: Yes    Birth control/protection: Post-menopausal, Surgical  Other Topics Concern   Not on file  Social History Narrative   Not on file   Social Drivers of Health   Financial Resource Strain: Low Risk  (08/23/2024)   Overall Financial Resource Strain (CARDIA)    Difficulty of Paying Living Expenses: Not hard at all  Food Insecurity: No Food Insecurity (08/23/2024)   Hunger Vital Sign    Worried About Running Out of Food in the Last Year: Never true    Ran Out of Food in the Last Year: Never true  Transportation Needs: No Transportation Needs (08/23/2024)   PRAPARE - Administrator, Civil Service (Medical): No    Lack of Transportation (Non-Medical): No  Physical Activity: Sufficiently Active (08/23/2024)   Exercise Vital Sign    Days of Exercise per Week: 6 days    Minutes of Exercise per Session: 60 min  Stress: No Stress Concern Present (08/23/2024)   [employer] of Occupational Health - Occupational Stress Questionnaire    Feeling of Stress: Not at all  Social Connections: Socially Integrated (08/23/2024)   Social Connection and Isolation Panel    Frequency of Communication with Friends and  Family: More than three times a week    Frequency of Social Gatherings with Friends and Family: More than three times a week    Attends Religious Services: More than 4 times per year    Active Member of [employer] or Organizations: Yes    Attends Banker Meetings: More than 4 times per year    Marital Status: Married  Catering manager Violence: Not At Risk (08/23/2024)   Humiliation, Afraid, Rape, and Kick questionnaire  Fear of Current or Ex-Partner: No    Emotionally Abused: No    Physically Abused: No    Sexually Abused: No    Review of Systems: See HPI, otherwise negative ROS  Physical Exam: BP 106/65   Pulse 72   Temp (!) 96.6 F (35.9 C) (Temporal)   Resp 20   Ht 5' 4 (1.626 m)   Wt 55.1 kg   SpO2 99%   BMI 20.84 kg/m  General:   Alert,  pleasant and cooperative in NAD Head:  Normocephalic and atraumatic. Neck:  Supple; no masses or thyromegaly. Lungs:  Clear throughout to auscultation.    Heart:  Regular rate and rhythm. Abdomen:  Soft, nontender and nondistended. Normal bowel sounds, without guarding, and without rebound.   Neurologic:  Alert and  oriented x4;  grossly normal neurologically.  Impression/Plan: JAYDALYNN OLIVERO is here for an colonoscopy to be performed for a history of adenomatous polyps on 2020   Risks, benefits, limitations, and alternatives regarding  colonoscopy have been reviewed with the patient.  Questions have been answered.  All parties agreeable.   Veronica Copping, MD  09/12/2024, 9:54 AM

## 2024-09-12 NOTE — Anesthesia Postprocedure Evaluation (Signed)
 Anesthesia Post Note  Patient: Veronica Ferrell  Procedure(s) Performed: COLONOSCOPY POLYPECTOMY, INTESTINE  Patient location during evaluation: PACU Anesthesia Type: General Level of consciousness: awake and alert Pain management: pain level controlled Vital Signs Assessment: post-procedure vital signs reviewed and stable Respiratory status: spontaneous breathing, nonlabored ventilation, respiratory function stable and patient connected to nasal cannula oxygen Cardiovascular status: blood pressure returned to baseline and stable Postop Assessment: no apparent nausea or vomiting Anesthetic complications: no   No notable events documented.   Last Vitals:  Vitals:   09/12/24 0935 09/12/24 1035  BP: 106/65 (!) 87/53  Pulse: 72 67  Resp: 20 16  Temp: (!) 35.9 C (!) 35.9 C  SpO2: 99% 98%    Last Pain:  Vitals:   09/12/24 1035  TempSrc: Tympanic  PainSc: Asleep                 Lyn[place] KANDICE Clause

## 2024-09-12 NOTE — Anesthesia Preprocedure Evaluation (Signed)
 Anesthesia Evaluation  Patient identified by MRN, date of birth, ID band Patient awake    Reviewed: Allergy & Precautions, H&P , NPO status , Patient's Chart, lab work & pertinent test results, reviewed documented beta blocker date and time   Airway Mallampati: II   Neck ROM: full    Dental  (+) Poor Dentition   Pulmonary neg pulmonary ROS   Pulmonary exam normal        Cardiovascular Exercise Tolerance: Good negative cardio ROS Normal cardiovascular exam Rhythm:regular Rate:Normal     Neuro/Psych  Neuromuscular disease  negative psych ROS   GI/Hepatic Neg liver ROS,GERD  Medicated,,  Endo/Other  negative endocrine ROS    Renal/GU negative Renal ROS  negative genitourinary   Musculoskeletal   Abdominal   Peds  Hematology negative hematology ROS (+)   Anesthesia Other Findings Past Medical History: No date: Addison's disease (HCC) No date: Allergy No date: Arthritis     Comment:  fingers No date: Cancer (HCC)     Comment:  skin No date: Chicken pox No date: Colon polyps 12/26/2020: Concussion No date: Diverticulitis No date: GERD (gastroesophageal reflux disease) 10/05/2023: Head injury 07/11/2024: Norovirus 07/11/2024: Septic shock (HCC) Past Surgical History: No date: ABDOMINAL HYSTERECTOMY No date: APPENDECTOMY No date: BREAST EXCISIONAL BIOPSY; Right     Comment:  2010 No date: BREAST SURGERY 07/14/2019: COLONOSCOPY WITH PROPOFOL ; N/A     Comment:  Procedure: COLONOSCOPY WITH PROPOFOL ;  Surgeon: Jinny Carmine, MD;  Location: [facility];  Service:               Endoscopy;  Laterality: N/A; 04/10/2017: ESOPHAGOGASTRODUODENOSCOPY (EGD) WITH PROPOFOL ; N/A     Comment:  Procedure: ESOPHAGOGASTRODUODENOSCOPY (EGD) WITH               PROPOFOL ;  Surgeon: Carmine Jinny, MD;  Location: [facility];  Service: Endoscopy;  Laterality: N/A;                requeests early  as possible No date: FOOT SURGERY; Bilateral No date: TUBAL LIGATION No date: varicose veins BMI    Body Mass Index: 20.84 kg/m     Reproductive/Obstetrics negative OB ROS                              Anesthesia Physical Anesthesia Plan  ASA: 3  Anesthesia Plan: General   Post-op Pain Management:    Induction:   PONV Risk Score and Plan:   Airway Management Planned:   Additional Equipment:   Intra-op Plan:   Post-operative Plan:   Informed Consent: I have reviewed the patients History and Physical, chart, labs and discussed the procedure including the risks, benefits and alternatives for the proposed anesthesia with the patient or authorized representative who has indicated his/her understanding and acceptance.     Dental Advisory Given  Plan Discussed with: CRNA  Anesthesia Plan Comments:         Anesthesia Quick Evaluation

## 2024-09-13 LAB — SURGICAL PATHOLOGY

## 2024-09-14 ENCOUNTER — Ambulatory Visit: Payer: Self-pay | Admitting: Gastroenterology

## 2024-11-21 DIAGNOSIS — H2513 Age-related nuclear cataract, bilateral: Secondary | ICD-10-CM | POA: Diagnosis not present

## 2024-11-21 DIAGNOSIS — H353131 Nonexudative age-related macular degeneration, bilateral, early dry stage: Secondary | ICD-10-CM | POA: Diagnosis not present

## 2024-11-21 DIAGNOSIS — H02403 Unspecified ptosis of bilateral eyelids: Secondary | ICD-10-CM | POA: Diagnosis not present

## 2024-11-21 DIAGNOSIS — H33311 Horseshoe tear of retina without detachment, right eye: Secondary | ICD-10-CM | POA: Diagnosis not present

## 2024-12-09 ENCOUNTER — Encounter: Payer: Self-pay | Admitting: Internal Medicine

## 2024-12-10 ENCOUNTER — Other Ambulatory Visit: Payer: Self-pay | Admitting: Internal Medicine

## 2025-01-09 ENCOUNTER — Telehealth: Payer: Self-pay | Admitting: *Deleted

## 2025-01-09 DIAGNOSIS — E785 Hyperlipidemia, unspecified: Secondary | ICD-10-CM

## 2025-01-09 DIAGNOSIS — R7303 Prediabetes: Secondary | ICD-10-CM

## 2025-01-09 DIAGNOSIS — R5383 Other fatigue: Secondary | ICD-10-CM

## 2025-01-09 NOTE — Telephone Encounter (Signed)
 I have pended labs for approval for pt to have done prior to her appt on 02/01/2025. If needed I will call pt and schedule lab appt.

## 2025-01-09 NOTE — Telephone Encounter (Signed)
 Copied from CRM [id number]. Topic: Clinical - Request for Lab/Test Order >> Jan 09, 2025 10:15 AM Veronica Ferrell wrote: Reason for CRM: patient requesting labs ordered before appt on 2/11- [phone]

## 2025-01-10 ENCOUNTER — Other Ambulatory Visit: Payer: Self-pay | Admitting: Internal Medicine

## 2025-01-10 DIAGNOSIS — Z1231 Encounter for screening mammogram for malignant neoplasm of breast: Secondary | ICD-10-CM

## 2025-01-10 NOTE — Telephone Encounter (Signed)
 LMTCB. Please schedule pt for a lab appt a few days prior to her appt on 02/01/2025.

## 2025-01-10 NOTE — Telephone Encounter (Signed)
 Pt has called back and scheduled a lab appt.

## 2025-01-26 ENCOUNTER — Other Ambulatory Visit

## 2025-01-26 DIAGNOSIS — E785 Hyperlipidemia, unspecified: Secondary | ICD-10-CM

## 2025-01-26 DIAGNOSIS — R5383 Other fatigue: Secondary | ICD-10-CM

## 2025-01-26 DIAGNOSIS — R7303 Prediabetes: Secondary | ICD-10-CM

## 2025-01-26 LAB — COMPREHENSIVE METABOLIC PANEL WITH GFR
ALT: 15 U/L (ref 3–35)
AST: 16 U/L (ref 5–37)
Albumin: 4.2 g/dL (ref 3.5–5.2)
Alkaline Phosphatase: 28 U/L — ABNORMAL LOW (ref 39–117)
BUN: 20 mg/dL (ref 6–23)
CO2: 31 meq/L (ref 19–32)
Calcium: 9.7 mg/dL (ref 8.4–10.5)
Chloride: 98 meq/L (ref 96–112)
Creatinine, Ser: 0.91 mg/dL (ref 0.40–1.20)
GFR: 61.6 mL/min
Glucose, Bld: 72 mg/dL (ref 70–99)
Potassium: 4.6 meq/L (ref 3.5–5.1)
Sodium: 136 meq/L (ref 135–145)
Total Bilirubin: 0.4 mg/dL (ref 0.2–1.2)
Total Protein: 6.6 g/dL (ref 6.0–8.3)

## 2025-01-26 LAB — CBC WITH DIFFERENTIAL/PLATELET
Basophils Absolute: 0.1 10*3/uL (ref 0.0–0.1)
Basophils Relative: 0.7 % (ref 0.0–3.0)
Eosinophils Absolute: 0.1 10*3/uL (ref 0.0–0.7)
Eosinophils Relative: 1.7 % (ref 0.0–5.0)
HCT: 41.1 % (ref 36.0–46.0)
Hemoglobin: 13.8 g/dL (ref 12.0–15.0)
Lymphocytes Relative: 51.9 % — ABNORMAL HIGH (ref 12.0–46.0)
Lymphs Abs: 3.8 10*3/uL (ref 0.7–4.0)
MCHC: 33.5 g/dL (ref 30.0–36.0)
MCV: 97.1 fl (ref 78.0–100.0)
Monocytes Absolute: 0.6 10*3/uL (ref 0.1–1.0)
Monocytes Relative: 8.5 % (ref 3.0–12.0)
Neutro Abs: 2.7 10*3/uL (ref 1.4–7.7)
Neutrophils Relative %: 37.2 % — ABNORMAL LOW (ref 43.0–77.0)
Platelets: 352 10*3/uL (ref 150.0–400.0)
RBC: 4.24 Mil/uL (ref 3.87–5.11)
RDW: 12.8 % (ref 11.5–15.5)
WBC: 7.3 10*3/uL (ref 4.0–10.5)

## 2025-01-26 LAB — HEMOGLOBIN A1C: Hgb A1c MFr Bld: 6.1 % (ref 4.6–6.5)

## 2025-01-26 LAB — LIPID PANEL
Cholesterol: 175 mg/dL (ref 28–200)
HDL: 94.8 mg/dL
LDL Cholesterol: 60 mg/dL (ref 10–99)
NonHDL: 80.68
Total CHOL/HDL Ratio: 2
Triglycerides: 105 mg/dL (ref 10.0–149.0)
VLDL: 21 mg/dL (ref 0.0–40.0)

## 2025-01-26 LAB — TSH: TSH: 3.53 u[IU]/mL (ref 0.35–5.50)

## 2025-01-26 LAB — LDL CHOLESTEROL, DIRECT: Direct LDL: 60 mg/dL

## 2025-02-01 ENCOUNTER — Ambulatory Visit: Admitting: Internal Medicine

## 2025-02-28 ENCOUNTER — Encounter

## 2025-08-30 ENCOUNTER — Ambulatory Visit
